# Patient Record
Sex: Female | Born: 1935 | Race: Black or African American | Hispanic: No | Smoking: Former smoker
Health system: Southern US, Community
[De-identification: ages and names within clinical notes are randomized; demographics above are authoritative.]

## PROBLEM LIST (undated history)

## (undated) DIAGNOSIS — G510 Bell's palsy: Secondary | ICD-10-CM

## (undated) DIAGNOSIS — I10 Essential (primary) hypertension: Secondary | ICD-10-CM

## (undated) DIAGNOSIS — Z96649 Presence of unspecified artificial hip joint: Secondary | ICD-10-CM

## (undated) DIAGNOSIS — L89153 Pressure ulcer of sacral region, stage 3: Secondary | ICD-10-CM

## (undated) DIAGNOSIS — W19XXXA Unspecified fall, initial encounter: Secondary | ICD-10-CM

## (undated) DIAGNOSIS — F419 Anxiety disorder, unspecified: Secondary | ICD-10-CM

## (undated) DIAGNOSIS — F32A Depression, unspecified: Secondary | ICD-10-CM

## (undated) DIAGNOSIS — I447 Left bundle-branch block, unspecified: Secondary | ICD-10-CM

## (undated) DIAGNOSIS — R296 Repeated falls: Secondary | ICD-10-CM

## (undated) DIAGNOSIS — D649 Anemia, unspecified: Secondary | ICD-10-CM

## (undated) DIAGNOSIS — K219 Gastro-esophageal reflux disease without esophagitis: Secondary | ICD-10-CM

## (undated) DIAGNOSIS — F329 Major depressive disorder, single episode, unspecified: Secondary | ICD-10-CM

## (undated) DIAGNOSIS — M978XXA Periprosthetic fracture around other internal prosthetic joint, initial encounter: Secondary | ICD-10-CM

## (undated) DIAGNOSIS — E119 Type 2 diabetes mellitus without complications: Secondary | ICD-10-CM

## (undated) DIAGNOSIS — M1990 Unspecified osteoarthritis, unspecified site: Secondary | ICD-10-CM

## (undated) DIAGNOSIS — I700 Atherosclerosis of aorta: Secondary | ICD-10-CM

## (undated) DIAGNOSIS — R011 Cardiac murmur, unspecified: Secondary | ICD-10-CM

## (undated) HISTORY — PX: APPENDECTOMY: SHX54

## (undated) HISTORY — PX: TUBAL LIGATION: SHX77

## (undated) HISTORY — PX: FRACTURE SURGERY: SHX138

---

## 2012-05-19 ENCOUNTER — Inpatient Hospital Stay (HOSPITAL_COMMUNITY)
Admission: AD | Admit: 2012-05-19 | Discharge: 2012-05-25 | DRG: 470 | Disposition: A | Discharge status: Home Health | Payer: Medicare Other | Source: Other Acute Inpatient Hospital | Attending: Internal Medicine | Admitting: Internal Medicine

## 2012-05-19 ENCOUNTER — Encounter (HOSPITAL_COMMUNITY): Payer: Self-pay | Admitting: Internal Medicine

## 2012-05-19 DIAGNOSIS — E119 Type 2 diabetes mellitus without complications: Secondary | ICD-10-CM | POA: Diagnosis present

## 2012-05-19 DIAGNOSIS — S72009A Fracture of unspecified part of neck of unspecified femur, initial encounter for closed fracture: Principal | ICD-10-CM | POA: Diagnosis present

## 2012-05-19 DIAGNOSIS — F411 Generalized anxiety disorder: Secondary | ICD-10-CM | POA: Diagnosis present

## 2012-05-19 DIAGNOSIS — D649 Anemia, unspecified: Secondary | ICD-10-CM

## 2012-05-19 DIAGNOSIS — I447 Left bundle-branch block, unspecified: Secondary | ICD-10-CM

## 2012-05-19 DIAGNOSIS — G510 Bell's palsy: Secondary | ICD-10-CM | POA: Diagnosis present

## 2012-05-19 DIAGNOSIS — E871 Hypo-osmolality and hyponatremia: Secondary | ICD-10-CM | POA: Diagnosis present

## 2012-05-19 DIAGNOSIS — W108XXA Fall (on) (from) other stairs and steps, initial encounter: Secondary | ICD-10-CM | POA: Diagnosis present

## 2012-05-19 DIAGNOSIS — I10 Essential (primary) hypertension: Secondary | ICD-10-CM

## 2012-05-19 DIAGNOSIS — F3289 Other specified depressive episodes: Secondary | ICD-10-CM | POA: Diagnosis present

## 2012-05-19 DIAGNOSIS — D62 Acute posthemorrhagic anemia: Secondary | ICD-10-CM

## 2012-05-19 DIAGNOSIS — E118 Type 2 diabetes mellitus with unspecified complications: Secondary | ICD-10-CM

## 2012-05-19 DIAGNOSIS — F329 Major depressive disorder, single episode, unspecified: Secondary | ICD-10-CM | POA: Diagnosis present

## 2012-05-19 DIAGNOSIS — I454 Nonspecific intraventricular block: Secondary | ICD-10-CM | POA: Diagnosis present

## 2012-05-19 HISTORY — DX: Cardiac murmur, unspecified: R01.1

## 2012-05-19 HISTORY — DX: Depression, unspecified: F32.A

## 2012-05-19 HISTORY — DX: Bell's palsy: G51.0

## 2012-05-19 HISTORY — DX: Essential (primary) hypertension: I10

## 2012-05-19 HISTORY — DX: Unspecified osteoarthritis, unspecified site: M19.90

## 2012-05-19 HISTORY — DX: Anemia, unspecified: D64.9

## 2012-05-19 HISTORY — DX: Major depressive disorder, single episode, unspecified: F32.9

## 2012-05-19 LAB — CBC
MCH: 28.6 pg (ref 26.0–34.0)
Platelets: 199 10*3/uL (ref 150–400)
RBC: 3.7 MIL/uL — ABNORMAL LOW (ref 3.87–5.11)
RDW: 13.3 % (ref 11.5–15.5)
WBC: 8.7 10*3/uL (ref 4.0–10.5)

## 2012-05-19 LAB — COMPREHENSIVE METABOLIC PANEL
AST: 31 U/L (ref 0–37)
Albumin: 3.8 g/dL (ref 3.5–5.2)
Alkaline Phosphatase: 57 U/L (ref 39–117)
Chloride: 93 mEq/L — ABNORMAL LOW (ref 96–112)
Potassium: 3.9 mEq/L (ref 3.5–5.1)
Total Bilirubin: 0.8 mg/dL (ref 0.3–1.2)
Total Protein: 8.5 g/dL — ABNORMAL HIGH (ref 6.0–8.3)

## 2012-05-19 LAB — HEMOGLOBIN A1C: Hgb A1c MFr Bld: 5.9 % — ABNORMAL HIGH (ref <5.7)

## 2012-05-19 LAB — GLUCOSE, CAPILLARY
Glucose-Capillary: 97 mg/dL (ref 70–99)
Glucose-Capillary: 121 mg/dL — ABNORMAL HIGH (ref 70–99)

## 2012-05-19 MED ORDER — ONDANSETRON HCL 4 MG/2ML IJ SOLN: 4.0000 mg | Freq: Four times a day (QID) | INTRAMUSCULAR | Status: DC | PRN

## 2012-05-19 MED ORDER — SODIUM CHLORIDE 0.9 % IJ SOLN
3.0000 mL | Freq: Two times a day (BID) | INTRAMUSCULAR | Status: DC
  Administered 2012-05-19 – 2012-05-24 (×8): 3 mL via INTRAVENOUS

## 2012-05-19 MED ORDER — ENOXAPARIN SODIUM 40 MG/0.4ML ~~LOC~~ SOLN
40.0000 mg | SUBCUTANEOUS | Status: DC
  Filled 2012-05-19 (×2): qty 0.4

## 2012-05-19 MED ORDER — INSULIN ASPART 100 UNIT/ML ~~LOC~~ SOLN
0.0000 [IU] | Freq: Three times a day (TID) | SUBCUTANEOUS | Status: DC
  Administered 2012-05-20: 1 [IU] via SUBCUTANEOUS
  Administered 2012-05-21 – 2012-05-22 (×3): 2 [IU] via SUBCUTANEOUS
  Administered 2012-05-23 – 2012-05-24 (×2): 1 [IU] via SUBCUTANEOUS
  Administered 2012-05-24: 2 [IU] via SUBCUTANEOUS
  Administered 2012-05-24: 1 [IU] via SUBCUTANEOUS

## 2012-05-19 MED ORDER — CEFAZOLIN SODIUM-DEXTROSE 2-3 GM-% IV SOLR
2.0000 g | Freq: Once | INTRAVENOUS | Status: AC
  Administered 2012-05-20: 2 g via INTRAVENOUS
  Filled 2012-05-19: qty 50

## 2012-05-19 MED ORDER — HYDROMORPHONE HCL PF 1 MG/ML IJ SOLN
0.5000 mg | INTRAMUSCULAR | Status: DC | PRN
  Administered 2012-05-20: 0.5 mg via INTRAVENOUS
  Filled 2012-05-19: qty 1

## 2012-05-19 MED ORDER — LEVALBUTEROL HCL 0.63 MG/3ML IN NEBU
0.6300 mg | INHALATION_SOLUTION | Freq: Four times a day (QID) | RESPIRATORY_TRACT | Status: DC | PRN
  Filled 2012-05-19: qty 3

## 2012-05-19 MED ORDER — HYDROCODONE-ACETAMINOPHEN 5-325 MG PO TABS
1.0000 | ORAL_TABLET | ORAL | Status: DC | PRN
  Administered 2012-05-19 – 2012-05-20 (×3): 2 via ORAL
  Administered 2012-05-21: 1 via ORAL
  Filled 2012-05-19 (×4): qty 2

## 2012-05-19 MED ORDER — ONDANSETRON HCL 4 MG PO TABS: 4.0000 mg | ORAL_TABLET | Freq: Four times a day (QID) | ORAL | Status: DC | PRN

## 2012-05-19 MED ORDER — SODIUM CHLORIDE 0.9 % IV SOLN
INTRAVENOUS | Status: DC
  Administered 2012-05-19: 15:00:00 via INTRAVENOUS

## 2012-05-19 NOTE — Consult Note (Signed)
Reason for Consult:left femoral neck fracture Referring Physician: Jeanella Anton    MD  [name] is an 79 y.o. female.  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   Past Medical History  Diagnosis Date  . Heart murmur   . Hypertension   . Depression   . Diabetes mellitus without complication     type 2  . Bell's palsy   . Arthritis   . Anemia     Past Surgical History  Procedure Date  . Appendectomy   . Tubal ligation     History reviewed. No pertinent family history.  Social History:  reports that she quit smoking about 37 years ago. She has never used smokeless tobacco. She reports that she does not drink alcohol or use illicit drugs.  Allergies: No Known Allergies  Medications: I have reviewed the patient's current medications.  Results for orders placed during the hospital encounter of 05/19/12 (from the past 48 hour(s))  GLUCOSE, CAPILLARY     Status: Normal   Collection Time   05/19/12  2:45 PM      Component Value Range Comment   Glucose-Capillary 97  70 - 99 mg/dL   CBC     Status: Abnormal   Collection Time   05/19/12  3:12 PM      Component Value Range Comment   WBC 8.7  4.0 - 10.5 K/uL    RBC 3.70 (*) 3.87 - 5.11 MIL/uL    Hemoglobin 10.6 (*) 12.0 - 15.0 g/dL    HCT 47.8 (*) 29.5 - 46.0 %    MCV 82.4  78.0 - 100.0 fL    MCH 28.6  26.0 - 34.0 pg    MCHC 34.8  30.0 - 36.0 g/dL    RDW 62.1  30.8 - 65.7 %    Platelets 199  150 - 400 K/uL   TROPONIN I     Status: Normal   Collection Time   05/19/12  3:12 PM      Component Value Range Comment   Troponin I <0.30  <0.30 ng/mL   COMPREHENSIVE METABOLIC PANEL     Status: Abnormal   Collection Time   05/19/12  3:12 PM      Component Value Range Comment   Sodium 131 (*) 135 - 145 mEq/L    Potassium 3.9  3.5 - 5.1 mEq/L    Chloride 93 (*) 96 - 112 mEq/L    CO2 25  19 -  32 mEq/L    Glucose, Bld 110 (*) 70 - 99 mg/dL    BUN 9  6 - 23 mg/dL    Creatinine, Ser 8.46  0.50 - 1.10 mg/dL    Calcium 9.5  8.4 - 96.2 mg/dL    Total Protein 8.5 (*) 6.0 - 8.3 g/dL    Albumin 3.8  3.5 - 5.2 g/dL    AST 31  0 - 37 U/L    ALT 18  0 - 35 U/L    Alkaline Phosphatase 57  39 - 117 U/L    Total Bilirubin 0.8  0.3 - 1.2 mg/dL    GFR calc non Af Amer >90  >90 mL/min    GFR calc Af Amer >90  >90 mL/min   GLUCOSE, CAPILLARY     Status: Abnormal   Collection Time   05/19/12  4:47 PM  Component Value Range Comment   Glucose-Capillary 174 (*) 70 - 99 mg/dL     No results found.  Review of Systems  Constitutional: Negative for fever, chills and weight loss.  Eyes: Negative for blurred vision.  Respiratory: Negative.   Cardiovascular: Negative for chest pain.       Heart murmur just noted on admission.   Positive for HTN  Gastrointestinal: Negative.   Genitourinary: Negative.   Musculoskeletal: Positive for joint pain.  Skin: Negative for rash.  Neurological: Positive for headaches.  Endo/Heme/Allergies:       Diabetes  Psychiatric/Behavioral: Positive for depression.   Blood pressure 160/71, pulse 79, temperature 99.4 F (37.4 C), temperature source Oral, resp. rate 18, SpO2 94.00%. Physical Exam  Constitutional: She appears well-developed and well-nourished.  HENT:  Head: Normocephalic.  Eyes: Pupils are equal, round, and reactive to light.  Neck: Normal range of motion.  Cardiovascular: Normal rate and normal heart sounds.   Respiratory: Effort normal and breath sounds normal.  GI: Soft.  Musculoskeletal:       Left hip short and external rotated. Pulses intact.   Skin: Skin is warm and dry.  Psychiatric: She has a normal mood and affect. Her behavior is normal. Judgment and thought content normal.    Assessment/Plan:   Left femoral neck fracture for hemiarthroplasty vs. THA .  Procedure discussed , risks discussed, she understands and agrees to  proceed. Discussed with daughters who are at the bedside.   Jed Kutch C 05/19/2012, 5:42 PM

## 2012-05-19 NOTE — H&P (Addendum)
[facility] History and Physical  Lillee Mooneyhan ZOX:[id number] DOB: 20-May-1933 DOA: 05/19/2012  Referring physician: [facility] PCP: Vennie Homans, MD   Chief Complaint: Hip fracture  HPI:  79 year old female who presents to the emergency room at [facility] after a fall. The patient states that she fell after she tripped over couple of steps while saying goodbye to her son. The patient was not paying attention while she was walking lost her balance and fell. She denied any head injury loss of consciousness neck injury back injury chest pain shortness of breath abdominal pain nausea vomiting. She denies any cardiac history and denies any history of coronary artery disease although she is a diabetic. Her EKG shows left bundle-branch block but she is unaware often denies ever having an MI . She was transferred to [facility] for surgery by Dr. Ophelia Charter for her hip fracture      Review of Systems: negative for the following  Constitutional: Denies fever, chills, diaphoresis, appetite change and fatigue.  HEENT: Denies photophobia, eye pain, redness, hearing loss, ear pain, congestion, sore throat, rhinorrhea, sneezing, mouth sores, trouble swallowing, neck pain, neck stiffness and tinnitus.  Respiratory: Denies SOB, DOE, cough, chest tightness, and wheezing.  Cardiovascular: Denies chest pain, palpitations and leg swelling.  Gastrointestinal: Denies nausea, vomiting, abdominal pain, diarrhea, constipation, blood in stool and abdominal distention.  Genitourinary: Denies dysuria, urgency, frequency, hematuria, flank pain and difficulty urinating.  Musculoskeletal: Denies myalgias, back pain, joint swelling, arthralgias and gait problem.  Skin: Denies pallor, rash and wound.  Neurological: Denies dizziness, seizures, syncope, weakness, light-headedness, numbness and headaches.  Hematological: Denies adenopathy. Easy bruising, personal or family bleeding history    Psychiatric/Behavioral: Denies suicidal ideation, mood changes, confusion, nervousness, sleep disturbance and agitation       Past medical history Hypertension Type 2 diabetes Anxiety Depression    Past surgical history Tubal ligation Appendectomy Hysterectomy   Allergies no known allergies  Home medications Xanax meloxicam lisinopril potassium cephalexin Glucophage Norvasc metoprolol Celexa     No past surgical history on file.    Social History:  does not have a smoking history on file. She does not have any smokeless tobacco history on file. Her alcohol and drug histories not on file.    Allergies not on file  Family history negative for diabetes   Prior to Admission medications   Not on File     Physical Exam: Filed Vitals:   05/19/12 1418  BP: 160/71  Pulse: 79  Temp: 99.4 F (37.4 C)  TempSrc: Oral  Resp: 18  SpO2: 94%     Constitutional: Vital signs reviewed. Patient is a well-developed and well-nourished in no acute distress and cooperative with exam. Alert and oriented x3.  Head: Normocephalic and atraumatic  Ear: TM normal bilaterally  Mouth: no erythema or exudates, MMM  Eyes: PERRL, EOMI, conjunctivae normal, No scleral icterus.  Neck: Supple, Trachea midline normal ROM, No JVD, mass, thyromegaly, or carotid bruit present.  Cardiovascular: RRR, S1 normal, S2 normal, no MRG, pulses symmetric and intact bilaterally  Pulmonary/Chest: CTAB, no wheezes, rales, or rhonchi  Abdominal: Soft. Non-tender, non-distended, bowel sounds are normal, no masses, organomegaly, or guarding present.  GU: no CVA tenderness Musculoskeletal: No joint deformities, erythema, or stiffness, ROM full and no nontender Ext: no edema and no cyanosis, pulses palpable bilaterally (DP and PT)  Hematology: no cervical, inginal, or axillary adenopathy.  Neurological: A&O x3, Strenght is normal and symmetric bilaterally, cranial nerve II-XII are grossly intact,  no focal  motor deficit, sensory intact to light touch bilaterally.  Skin: Warm, dry and intact. No rash, cyanosis, or clubbing.  Psychiatric: Normal mood and affect. speech and behavior is normal. Judgment and thought content normal. Cognition and memory are normal.       Labs on Admission:    Basic Metabolic Panel: No results found for this basename: NA:5,K:5,CL:5,CO2:5,GLUCOSE:5,BUN:5,CREATININE:5,CALCIUM:5,MG:5,PHOS:5 in the last 168 hours Liver Function Tests: No results found for this basename: AST:5,ALT:5,ALKPHOS:5,BILITOT:5,PROT:5,ALBUMIN:5 in the last 168 hours No results found for this basename: LIPASE:5,AMYLASE:5 in the last 168 hours No results found for this basename: AMMONIA:5 in the last 168 hours CBC: No results found for this basename: WBC:5,NEUTROABS:5,HGB:5,HCT:5,MCV:5,PLT:5 in the last 168 hours Cardiac Enzymes: No results found for this basename: CKTOTAL:5,CKMB:5,CKMBINDEX:5,TROPONINI:5 in the last 168 hours  BNP (last 3 results) No results found for this basename: PROBNP:3 in the last 8760 hours    CBG:  Lab 05/19/12 1445  GLUCAP 97    Radiological Exams on Admission: No results found.  EKG: Independently reviewed. EKG shows left bundle branch block Assessment/Plan Principal Problem:  *Hip fracture   Left Fracture Dr. Ophelia Charter aWARE, anticipate surgery tomorrow, did not any history of any cardiopulmonary symptoms. No chest pain or shortness of breath or history of arrhythmias, but the patient has left bundle branch block, obtain a 2-D echo and a set of cardiac enzymes and possibly repeat EKG after surgery. She is a moderate risk given her age and her other comorbidities, for this intermediate risk surgery, the patient was made aware of this and she is willing to proceed with the surgery  Type 2 diabetes start the patient on sliding scale insulin  HYPERTENSION continue outpatient medications    hyponatremia she had a sodium of 126 unclear etiology, will obtain  urine sodium urine creatinine and place her on fluid restriction, we'll repeat a chest x-ray as a result from [facility] are unavailable  Anemia with a baseline hemoglobin of 10 .9 repeat hemoglobin, baseline unknown   Code Status:   full Family Communication: bedside Disposition Plan: admit   Time spent: 70 mins   [facility] Pager [phone]  If 7PM-7AM, please contact night-coverage [email or web address] Password [facility] 05/19/2012, 2:48 PM

## 2012-05-19 NOTE — Progress Notes (Signed)
Patient ID: [name], female   DOB: 08-Nov-1933, 79 y.o.   MRN: [id number] Surgery Friday at about 10 AM or 10:30AM.    NPO and pre-op Ancef ordered.  All ?'s answered ,  4 children at bedside. Discussed anterior vs posterior approach planned.

## 2012-05-20 ENCOUNTER — Inpatient Hospital Stay (HOSPITAL_COMMUNITY): Payer: Medicare Other | Admitting: Anesthesiology

## 2012-05-20 ENCOUNTER — Inpatient Hospital Stay: Admit: 2012-05-20 | Payer: Medicare Other | Admitting: Orthopaedic Surgery

## 2012-05-20 ENCOUNTER — Encounter (HOSPITAL_COMMUNITY)
Admission: AD | Disposition: A | Discharge status: Home Health | Payer: Self-pay | Source: Other Acute Inpatient Hospital | Attending: Family Medicine

## 2012-05-20 ENCOUNTER — Encounter (HOSPITAL_COMMUNITY): Payer: Self-pay | Admitting: Anesthesiology

## 2012-05-20 ENCOUNTER — Inpatient Hospital Stay (HOSPITAL_COMMUNITY): Payer: Medicare Other

## 2012-05-20 DIAGNOSIS — I369 Nonrheumatic tricuspid valve disorder, unspecified: Secondary | ICD-10-CM

## 2012-05-20 DIAGNOSIS — S72009A Fracture of unspecified part of neck of unspecified femur, initial encounter for closed fracture: Principal | ICD-10-CM

## 2012-05-20 DIAGNOSIS — I447 Left bundle-branch block, unspecified: Secondary | ICD-10-CM

## 2012-05-20 DIAGNOSIS — E119 Type 2 diabetes mellitus without complications: Secondary | ICD-10-CM

## 2012-05-20 DIAGNOSIS — E871 Hypo-osmolality and hyponatremia: Secondary | ICD-10-CM

## 2012-05-20 DIAGNOSIS — D638 Anemia in other chronic diseases classified elsewhere: Secondary | ICD-10-CM | POA: Insufficient documentation

## 2012-05-20 DIAGNOSIS — D649 Anemia, unspecified: Secondary | ICD-10-CM

## 2012-05-20 HISTORY — PX: HIP ARTHROPLASTY: SHX981

## 2012-05-20 LAB — URINALYSIS, ROUTINE W REFLEX MICROSCOPIC
Nitrite: NEGATIVE
Specific Gravity, Urine: 1.011 (ref 1.005–1.030)
pH: 5.5 (ref 5.0–8.0)

## 2012-05-20 LAB — CREATININE, URINE, RANDOM: Creatinine, Urine: 69.52 mg/dL

## 2012-05-20 LAB — COMPREHENSIVE METABOLIC PANEL
AST: 27 U/L (ref 0–37)
Alkaline Phosphatase: 48 U/L (ref 39–117)
CO2: 26 mEq/L (ref 19–32)
Chloride: 93 mEq/L — ABNORMAL LOW (ref 96–112)
Creatinine, Ser: 0.6 mg/dL (ref 0.50–1.10)
GFR calc non Af Amer: 86 mL/min — ABNORMAL LOW (ref >90)
Potassium: 3.7 mEq/L (ref 3.5–5.1)
Total Bilirubin: 0.6 mg/dL (ref 0.3–1.2)

## 2012-05-20 LAB — CBC
HCT: 29.2 % — ABNORMAL LOW (ref 36.0–46.0)
Hemoglobin: 9.1 g/dL — ABNORMAL LOW (ref 12.0–15.0)
Hemoglobin: 9.9 g/dL — ABNORMAL LOW (ref 12.0–15.0)
MCH: 28.8 pg (ref 26.0–34.0)
MCV: 84.9 fL (ref 78.0–100.0)
RBC: 3.2 MIL/uL — ABNORMAL LOW (ref 3.87–5.11)
RBC: 3.44 MIL/uL — ABNORMAL LOW (ref 3.87–5.11)
WBC: 5.8 10*3/uL (ref 4.0–10.5)

## 2012-05-20 LAB — ABO/RH: ABO/RH(D): B NEG

## 2012-05-20 LAB — GLUCOSE, CAPILLARY
Glucose-Capillary: 93 mg/dL (ref 70–99)
Glucose-Capillary: 136 mg/dL — ABNORMAL HIGH (ref 70–99)

## 2012-05-20 LAB — SODIUM, URINE, RANDOM: Sodium, Ur: 22 mEq/L

## 2012-05-20 LAB — URINE MICROSCOPIC-ADD ON

## 2012-05-20 LAB — PROTIME-INR
INR: 1.04 (ref 0.00–1.49)
Prothrombin Time: 13.5 seconds (ref 11.6–15.2)

## 2012-05-20 LAB — TROPONIN I: Troponin I: <0.3 ng/mL (ref <0.30)

## 2012-05-20 LAB — CREATININE, SERUM: Creatinine, Ser: 0.57 mg/dL (ref 0.50–1.10)

## 2012-05-20 SURGERY — HEMIARTHROPLASTY, HIP, DIRECT ANTERIOR APPROACH, FOR FRACTURE
Anesthesia: General | Site: Hip | Laterality: Left | Wound class: Clean

## 2012-05-20 MED ORDER — ALPRAZOLAM 0.5 MG PO TABS
0.5000 mg | ORAL_TABLET | Freq: Every evening | ORAL | Status: DC | PRN
  Administered 2012-05-20 – 2012-05-24 (×5): 0.5 mg via ORAL
  Filled 2012-05-20 (×5): qty 1

## 2012-05-20 MED ORDER — WARFARIN - PHARMACIST DOSING INPATIENT
Freq: Every day | Status: DC
  Administered 2012-05-21 – 2012-05-22 (×2)

## 2012-05-20 MED ORDER — MENTHOL 3 MG MT LOZG
1.0000 | LOZENGE | OROMUCOSAL | Status: DC | PRN
  Filled 2012-05-20 (×2): qty 9

## 2012-05-20 MED ORDER — DOCUSATE SODIUM 100 MG PO CAPS
100.0000 mg | ORAL_CAPSULE | Freq: Two times a day (BID) | ORAL | Status: DC
  Administered 2012-05-20 – 2012-05-25 (×10): 100 mg via ORAL
  Filled 2012-05-20 (×12): qty 1

## 2012-05-20 MED ORDER — CITALOPRAM HYDROBROMIDE 10 MG PO TABS
10.0000 mg | ORAL_TABLET | Freq: Every day | ORAL | Status: DC
  Administered 2012-05-20 – 2012-05-25 (×6): 10 mg via ORAL
  Filled 2012-05-20 (×6): qty 1

## 2012-05-20 MED ORDER — METOCLOPRAMIDE HCL 5 MG PO TABS
5.0000 mg | ORAL_TABLET | Freq: Three times a day (TID) | ORAL | Status: DC | PRN
  Filled 2012-05-20: qty 2

## 2012-05-20 MED ORDER — FLEET ENEMA 7-19 GM/118ML RE ENEM
1.0000 | ENEMA | Freq: Once | RECTAL | Status: AC | PRN
  Filled 2012-05-20: qty 1

## 2012-05-20 MED ORDER — LISINOPRIL 20 MG PO TABS
20.0000 mg | ORAL_TABLET | Freq: Every day | ORAL | Status: DC
  Administered 2012-05-20 – 2012-05-25 (×6): 20 mg via ORAL
  Filled 2012-05-20 (×6): qty 1

## 2012-05-20 MED ORDER — NEOSTIGMINE METHYLSULFATE 1 MG/ML IJ SOLN
INTRAMUSCULAR | Status: DC | PRN
  Administered 2012-05-20: 3 mg via INTRAVENOUS

## 2012-05-20 MED ORDER — CHLORHEXIDINE GLUCONATE 4 % EX LIQD
CUTANEOUS | Status: AC
  Filled 2012-05-20: qty 15

## 2012-05-20 MED ORDER — CEFAZOLIN SODIUM-DEXTROSE 2-3 GM-% IV SOLR
2.0000 g | Freq: Four times a day (QID) | INTRAVENOUS | Status: AC
  Administered 2012-05-20 – 2012-05-21 (×2): 2 g via INTRAVENOUS
  Filled 2012-05-20 (×2): qty 50

## 2012-05-20 MED ORDER — FENTANYL CITRATE 0.05 MG/ML IJ SOLN
INTRAMUSCULAR | Status: DC | PRN
  Administered 2012-05-20 (×4): 50 ug via INTRAVENOUS

## 2012-05-20 MED ORDER — POTASSIUM CHLORIDE CRYS ER 20 MEQ PO TBCR
20.0000 meq | EXTENDED_RELEASE_TABLET | Freq: Every day | ORAL | Status: DC
  Administered 2012-05-20 – 2012-05-25 (×6): 20 meq via ORAL
  Filled 2012-05-20 (×7): qty 1

## 2012-05-20 MED ORDER — PATIENT'S GUIDE TO USING COUMADIN BOOK
Freq: Once | Status: AC
  Administered 2012-05-20: 18:00:00
  Filled 2012-05-20: qty 1

## 2012-05-20 MED ORDER — LOXAPINE SUCCINATE 10 MG PO CAPS
20.0000 mg | ORAL_CAPSULE | Freq: Every day | ORAL | Status: DC
  Filled 2012-05-20: qty 2

## 2012-05-20 MED ORDER — MORPHINE SULFATE 2 MG/ML IJ SOLN: 0.5000 mg | INTRAMUSCULAR | Status: DC | PRN

## 2012-05-20 MED ORDER — AMLODIPINE BESYLATE 5 MG PO TABS
5.0000 mg | ORAL_TABLET | Freq: Every day | ORAL | Status: DC
  Administered 2012-05-20 – 2012-05-25 (×6): 5 mg via ORAL
  Filled 2012-05-20 (×6): qty 1

## 2012-05-20 MED ORDER — HYDROMORPHONE HCL PF 1 MG/ML IJ SOLN
0.2500 mg | INTRAMUSCULAR | Status: DC | PRN
  Administered 2012-05-20 (×2): 0.5 mg via INTRAVENOUS

## 2012-05-20 MED ORDER — METHOCARBAMOL 500 MG PO TABS
500.0000 mg | ORAL_TABLET | Freq: Four times a day (QID) | ORAL | Status: DC | PRN
  Administered 2012-05-22 – 2012-05-25 (×4): 500 mg via ORAL
  Filled 2012-05-20 (×6): qty 1

## 2012-05-20 MED ORDER — SENNOSIDES-DOCUSATE SODIUM 8.6-50 MG PO TABS
1.0000 | ORAL_TABLET | Freq: Every evening | ORAL | Status: DC | PRN
  Administered 2012-05-22 – 2012-05-23 (×2): 1 via ORAL
  Filled 2012-05-20 (×2): qty 1

## 2012-05-20 MED ORDER — WARFARIN SODIUM 5 MG PO TABS
5.0000 mg | ORAL_TABLET | Freq: Once | ORAL | Status: AC
  Administered 2012-05-20: 5 mg via ORAL
  Filled 2012-05-20: qty 1

## 2012-05-20 MED ORDER — ENOXAPARIN SODIUM 40 MG/0.4ML ~~LOC~~ SOLN
40.0000 mg | SUBCUTANEOUS | Status: DC
  Administered 2012-05-21 – 2012-05-25 (×5): 40 mg via SUBCUTANEOUS
  Filled 2012-05-20 (×7): qty 0.4

## 2012-05-20 MED ORDER — METHOCARBAMOL 100 MG/ML IJ SOLN
500.0000 mg | INTRAMUSCULAR | Status: AC
  Administered 2012-05-20: 500 mg via INTRAVENOUS
  Filled 2012-05-20: qty 5

## 2012-05-20 MED ORDER — CEFAZOLIN SODIUM 1-5 GM-% IV SOLN
1.0000 g | INTRAVENOUS | Status: AC
  Administered 2012-05-20: 1 g via INTRAVENOUS
  Filled 2012-05-20: qty 50

## 2012-05-20 MED ORDER — PHENOL 1.4 % MT LIQD
1.0000 | OROMUCOSAL | Status: DC | PRN
  Filled 2012-05-20: qty 177

## 2012-05-20 MED ORDER — METOCLOPRAMIDE HCL 5 MG/ML IJ SOLN
5.0000 mg | Freq: Three times a day (TID) | INTRAMUSCULAR | Status: DC | PRN
  Filled 2012-05-20: qty 2

## 2012-05-20 MED ORDER — LOXAPINE SUCCINATE 10 MG PO CAPS
20.0000 mg | ORAL_CAPSULE | Freq: Every day | ORAL | Status: DC
  Administered 2012-05-20 – 2012-05-24 (×5): 20 mg via ORAL
  Filled 2012-05-20 (×8): qty 2

## 2012-05-20 MED ORDER — BISACODYL 10 MG RE SUPP: 10.0000 mg | Freq: Every day | RECTAL | Status: DC | PRN

## 2012-05-20 MED ORDER — SODIUM CHLORIDE 0.9 % IR SOLN
Status: DC | PRN
  Administered 2012-05-20: 1000 mL

## 2012-05-20 MED ORDER — LACTATED RINGERS IV SOLN
INTRAVENOUS | Status: DC
  Administered 2012-05-20: 09:00:00 via INTRAVENOUS

## 2012-05-20 MED ORDER — METHOCARBAMOL 100 MG/ML IJ SOLN
500.0000 mg | Freq: Four times a day (QID) | INTRAVENOUS | Status: DC | PRN
  Filled 2012-05-20: qty 5

## 2012-05-20 MED ORDER — GLYCOPYRROLATE 0.2 MG/ML IJ SOLN
INTRAMUSCULAR | Status: DC | PRN
  Administered 2012-05-20: 0.4 mg via INTRAVENOUS

## 2012-05-20 MED ORDER — ACETAMINOPHEN 10 MG/ML IV SOLN
1000.0000 mg | Freq: Four times a day (QID) | INTRAVENOUS | Status: DC
  Administered 2012-05-20 – 2012-05-21 (×3): 1000 mg via INTRAVENOUS
  Filled 2012-05-20 (×4): qty 100

## 2012-05-20 MED ORDER — HYDROMORPHONE HCL PF 1 MG/ML IJ SOLN
INTRAMUSCULAR | Status: AC
  Filled 2012-05-20: qty 1

## 2012-05-20 MED ORDER — LACTATED RINGERS IV SOLN
INTRAVENOUS | Status: DC | PRN
  Administered 2012-05-20: 11:00:00 via INTRAVENOUS

## 2012-05-20 MED ORDER — ROCURONIUM BROMIDE 100 MG/10ML IV SOLN
INTRAVENOUS | Status: DC | PRN
  Administered 2012-05-20: 40 mg via INTRAVENOUS

## 2012-05-20 MED ORDER — POTASSIUM CHLORIDE IN NACL 20-0.9 MEQ/L-% IV SOLN
INTRAVENOUS | Status: DC
  Administered 2012-05-20: 17:00:00 via INTRAVENOUS
  Filled 2012-05-20 (×3): qty 1000

## 2012-05-20 MED ORDER — LIDOCAINE HCL (CARDIAC) 20 MG/ML IV SOLN
INTRAVENOUS | Status: DC | PRN
  Administered 2012-05-20: 50 mg via INTRAVENOUS

## 2012-05-20 MED ORDER — PROPOFOL 10 MG/ML IV BOLUS
INTRAVENOUS | Status: DC | PRN
  Administered 2012-05-20: 90 mg via INTRAVENOUS

## 2012-05-20 MED ORDER — BUPIVACAINE HCL (PF) 0.5 % IJ SOLN
INTRAMUSCULAR | Status: AC
  Filled 2012-05-20: qty 30

## 2012-05-20 MED ORDER — ONDANSETRON HCL 4 MG/2ML IJ SOLN
INTRAMUSCULAR | Status: DC | PRN
  Administered 2012-05-20: 4 mg via INTRAVENOUS

## 2012-05-20 SURGICAL SUPPLY — 60 items
BENZOIN TINCTURE PRP APPL 2/3 (GAUZE/BANDAGES/DRESSINGS) ×2 IMPLANT
BLADE SAW SAG 73X25 THK (BLADE) ×1
BLADE SAW SGTL 73X25 THK (BLADE) ×1 IMPLANT
BLADE SURG ROTATE 9660 (MISCELLANEOUS) IMPLANT
BRUSH FEMORAL CANAL (MISCELLANEOUS) IMPLANT
CLOTH BEACON ORANGE TIMEOUT ST (SAFETY) ×2 IMPLANT
COVER BACK TABLE 24X17X13 BIG (DRAPES) IMPLANT
DRAPE INCISE IOBAN 66X45 STRL (DRAPES) IMPLANT
DRAPE ORTHO SPLIT 77X108 STRL (DRAPES) ×2
DRAPE SURG ORHT 6 SPLT 77X108 (DRAPES) ×2 IMPLANT
DRAPE U-SHAPE 47X51 STRL (DRAPES) ×2 IMPLANT
DRSG MEPILEX BORDER 4X12 (GAUZE/BANDAGES/DRESSINGS) ×2 IMPLANT
DRSG MEPILEX BORDER 4X8 (GAUZE/BANDAGES/DRESSINGS) ×2 IMPLANT
DRSG PAD ABDOMINAL 8X10 ST (GAUZE/BANDAGES/DRESSINGS) ×4 IMPLANT
DURAPREP 26ML APPLICATOR (WOUND CARE) ×2 IMPLANT
ELECT CAUTERY BLADE 6.4 (BLADE) ×2 IMPLANT
ELECT REM PT RETURN 9FT ADLT (ELECTROSURGICAL) ×2
ELECTRODE REM PT RTRN 9FT ADLT (ELECTROSURGICAL) ×1 IMPLANT
EVACUATOR 1/8 PVC DRAIN (DRAIN) IMPLANT
FACESHIELD LNG OPTICON STERILE (SAFETY) ×4 IMPLANT
GAUZE XEROFORM 5X9 LF (GAUZE/BANDAGES/DRESSINGS) ×2 IMPLANT
GLOVE BIOGEL PI IND STRL 7.5 (GLOVE) ×1 IMPLANT
GLOVE BIOGEL PI IND STRL 8 (GLOVE) ×1 IMPLANT
GLOVE BIOGEL PI INDICATOR 7.5 (GLOVE) ×1
GLOVE BIOGEL PI INDICATOR 8 (GLOVE) ×1
GLOVE ECLIPSE 7.0 STRL STRAW (GLOVE) ×2 IMPLANT
GLOVE ORTHO TXT STRL SZ7.5 (GLOVE) ×2 IMPLANT
GOWN PREVENTION PLUS LG XLONG (DISPOSABLE) IMPLANT
GOWN PREVENTION PLUS XLARGE (GOWN DISPOSABLE) ×2 IMPLANT
GOWN STRL NON-REIN LRG LVL3 (GOWN DISPOSABLE) ×2 IMPLANT
HANDPIECE INTERPULSE COAX TIP (DISPOSABLE)
IMMOBILIZER KNEE 22 UNIV (SOFTGOODS) IMPLANT
KIT BASIN OR (CUSTOM PROCEDURE TRAY) ×2 IMPLANT
KIT ROOM TURNOVER OR (KITS) ×2 IMPLANT
MANIFOLD NEPTUNE II (INSTRUMENTS) ×2 IMPLANT
NDL SUT 2 .5 CRC MAYO 1.732X (NEEDLE) ×1 IMPLANT
NEEDLE HYPO 25GX1X1/2 BEV (NEEDLE) ×2 IMPLANT
NEEDLE MAYO TAPER (NEEDLE) ×1
NS IRRIG 1000ML POUR BTL (IV SOLUTION) ×2 IMPLANT
PACK TOTAL JOINT (CUSTOM PROCEDURE TRAY) ×2 IMPLANT
PAD ARMBOARD 7.5X6 YLW CONV (MISCELLANEOUS) ×4 IMPLANT
PIN STEINMAN 3/16 (PIN) ×2 IMPLANT
SET HNDPC FAN SPRY TIP SCT (DISPOSABLE) IMPLANT
SPONGE GAUZE 4X4 12PLY (GAUZE/BANDAGES/DRESSINGS) ×2 IMPLANT
SPONGE LAP 4X18 X RAY DECT (DISPOSABLE) ×4 IMPLANT
STAPLER VISISTAT 35W (STAPLE) ×2 IMPLANT
STRIP CLOSURE SKIN 1/2X4 (GAUZE/BANDAGES/DRESSINGS) ×4 IMPLANT
SUCTION FRAZIER TIP 10 FR DISP (SUCTIONS) ×2 IMPLANT
SUT ETHIBOND NAB CT1 #1 30IN (SUTURE) ×6 IMPLANT
SUT TICRON (SUTURE) ×4 IMPLANT
SUT VIC AB 2-0 CT1 27 (SUTURE) ×3
SUT VIC AB 2-0 CT1 TAPERPNT 27 (SUTURE) ×3 IMPLANT
SUT VICRYL 0 TIES 12 18 (SUTURE) ×2 IMPLANT
SYR CONTROL 10ML LL (SYRINGE) ×2 IMPLANT
TAPE CLOTH SURG 6X10 WHT LF (GAUZE/BANDAGES/DRESSINGS) ×2 IMPLANT
TOWEL OR 17X24 6PK STRL BLUE (TOWEL DISPOSABLE) ×2 IMPLANT
TOWEL OR 17X26 10 PK STRL BLUE (TOWEL DISPOSABLE) ×2 IMPLANT
TOWER CARTRIDGE SMART MIX (DISPOSABLE) IMPLANT
TRAY FOLEY CATH 14FR (SET/KITS/TRAYS/PACK) IMPLANT
WATER STERILE IRR 1000ML POUR (IV SOLUTION) ×8 IMPLANT

## 2012-05-20 NOTE — Anesthesia Preprocedure Evaluation (Addendum)
Anesthesia Evaluation  Patient identified by MRN, date of birth, ID band Patient awake    Reviewed: Allergy & Precautions, H&P , NPO status , Patient's Chart, lab work & pertinent test results  History of Anesthesia Complications Negative for: history of anesthetic complications  Airway Mallampati: I      Dental   Pulmonary neg pulmonary ROS,  breath sounds clear to auscultation        Cardiovascular hypertension, Pt. on medications + Valvular Problems/Murmurs Rhythm:Regular Rate:Normal  ekg NSR c LBBB hx of " murmur" awaiting echo    Neuro/Psych PSYCHIATRIC DISORDERS Anxiety Depression History of Bell's Palsy.    GI/Hepatic negative GI ROS, Neg liver ROS,   Endo/Other  diabetes, Well Controlled, Type 2  Renal/GU negative Renal ROS     Musculoskeletal   Abdominal   Peds  Hematology   Anesthesia Other Findings   Reproductive/Obstetrics                          Anesthesia Physical Anesthesia Plan  ASA: III  Anesthesia Plan: General   Post-op Pain Management:    Induction: Intravenous  Airway Management Planned: Oral ETT  Additional Equipment:   Intra-op Plan:   Post-operative Plan: Possible Post-op intubation/ventilation  Informed Consent:   Dental advisory given  Plan Discussed with: CRNA, Anesthesiologist and Surgeon  Anesthesia Plan Comments:         Anesthesia Quick Evaluation

## 2012-05-20 NOTE — Progress Notes (Signed)
Echocardiogram discussed with Dr. Patty Sermons.  LVEF 50%, normal wall motion, no wall motion abnormalities. Very mild aortic stenosis.  Patient denies cardiac history, no recent chest pain. As per Dr. Antionette Poles H&P suggest no further evaluation.  [name], MD [facility] [phone]

## 2012-05-20 NOTE — Op Note (Signed)
[name], [name]                ACCOUNT NO.:  [id number]  MEDICAL RECORD NO.:  [id number]  LOCATION:  3W30C                        FACILITY:  [facility]  PHYSICIAN:  Mark C. Ophelia Charter, M.D.    DATE OF BIRTH:  March 22, 1934  DATE OF PROCEDURE:  05/20/2012 DATE OF DISCHARGE:                              OPERATIVE REPORT   PREOPERATIVE DIAGNOSIS:  Displaced left femoral neck fracture.  POSTOPERATIVE DIAGNOSIS:  Displaced left femoral neck fracture.  PROCEDURE: 1. Left hip hemiarthroplasty, DePuy press-fit. 2. Summit basic stem, +0 neck, 46 mm ball.  SURGEON:  Mark C. Ophelia Charter, MD  ASSISTANT:  Maud Deed, PA-C, medically necessary and present for the entire procedure.  EBL:  150 mL blood, 1 unit packed cells.  DESCRIPTION OF PROCEDURE:  After induction of general anesthesia, orotracheal intubation, 2 g Ancef prophylaxis, the patient placed in the lateral position.  A 10 x 15 drape was applied.  The patient already had a Foley catheter.  Hip was prepped with DuraPrep.  The usual straight sheets, sticky impervious blue U drape and split sheets and drapes were applied, impervious stockinette, Coban, sterile skin marker for posterior approach, and Betadine, Steri-Drape x2 sealing the skin.  Time- out procedure was completed.  Posterior approach was made.  Gluteus maximus was split in line with the fibers.  The patient received Lovenox and had capsular hematoma.  Capsule was opened, blood was evacuated. Neck was cut 1 fingerbreadth above the lesser trochanter.  There was noted a small groove in the calcar on the posterior aspect, which was not a fracture, did not extend medially and may have occurred at the time of injury when sharp edges of the proximal piece hit the edge of the cortex.  The calcar was carefully inspected posteriorly, was intact. Head was removed with corkscrew.  Trial sizing showed 46 was good, good suction fit with 46, 45 was too small, 47 will not fit.   Lateralizer, canal finer, single reamer, and then broaching up to a #2 size which gave excellent fit in this petite female, +5 was too tight, could not be reduced, +0 gave reconstruction of her neck length.  Leg lengths were equal in the thigh.  Quad was not tight.  She was able be flex to 80 degrees, internally rotated 90 degrees without subluxation.  There was good suction fit.  The hip was then dislocated using the bone hook. Trial was removed.  The entire field was irrigated including the canal. Permanent stem was placed together.  Neck was sucked into the ball. Trunnion was cleaned.  Head was reduced and impacted with the hammer. The stem collar was flushed with the calcar.  Again the area where the cortex had slight groove was inspected and it was normal.  The hip was reduced.  Identical findings of stability.  Capsule was repaired that had been opened in a T back with #1 Ethibond, piriformis superior to gluteus medius tendon with #1 Ethibond, 0 Vicryl, and #1 Vicryl in the gluteus maximus fascia and tensor fascia, 2- 0 Vicryl in subcutaneous tissue, skin staple closure, postop dressing, and knee immobilizer.  Marking was infiltrated in the skin at the end of the procedure,  4x4s and tape were applied.     Mark C. Ophelia Charter, M.D.     MCY/MEDQ  D:  05/20/2012  T:  05/20/2012  Job:  [id number]

## 2012-05-20 NOTE — Transfer of Care (Signed)
Immediate Anesthesia Transfer of Care Note  Patient: [name]  Procedure(s) Performed: Procedure(s) (LRB) with comments: ARTHROPLASTY BIPOLAR HIP (Left) - Left monopolar hemiarthroplasty  Patient Location: PACU  Anesthesia Type:General  Level of Consciousness: awake, alert , oriented and patient cooperative  Airway & Oxygen Therapy: Patient Spontanous Breathing and Patient connected to nasal cannula oxygen  Post-op Assessment: Report given to PACU RN, Post -op Vital signs reviewed and stable and Patient moving all extremities  Post vital signs: Reviewed and stable  Complications: No apparent anesthesia complications

## 2012-05-20 NOTE — Progress Notes (Signed)
[facility] PROGRESS NOTE  [name] YNW:[id number] DOB: [birth year] DOA: 05/19/2012 PCP: Vennie Homans, MD  Assessment/Plan: 1. Left hip fracture status post mechanical fall: No history of coronary disease or recent chest pain. EKG did show left bundle branch block, no old for comparison. 2-D echocardiogram was reassuring. For surgery today. Management per BX. 2. Hyponatremia: Likely secondary to Celexa. Appears stable. No further evaluation suggested. 3. Anemia: Baseline unknown. Monitor. Check CBC 1/25. 4. Diabetes mellitus type 2: Sliding scale insulin. Resume metformin on discharge. 5. Left bundle branch block: Chronicity unclear, no further evaluation suggested.  Code Status: Full code Family Communication: None present Disposition Plan: Pending therapy evaluations  [name], MD  [facility] Team 5 Pager [phone] If 7PM-7AM, please contact night-coverage at [email or web address], password [facility] 05/20/2012, 1:55 PM  LOS: 1 day   Brief narrative: 79 year old female who presents to the emergency room at [facility] after a fall. The patient states that she fell after she tripped over couple of steps while saying goodbye to her son. The patient was not paying attention while she was walking lost her balance and fell. She denied any head injury loss of consciousness neck injury back injury chest pain shortness of breath abdominal pain nausea vomiting. She denies any cardiac history and denies any history of coronary artery disease although she is a diabetic. Her EKG shows left bundle-branch block but she is unaware often denies ever having an MI . She was transferred to [facility] for surgery by Dr. Ophelia Charter for her hip fracture  Consultants:  Orthopedics  Procedures:  Left hip hemiarthroplasty, posterior approach  HPI/Subjective: Seen preoperatively. Feels okay. Denies any history of chest pain or heart disease.  Objective: Filed Vitals:   05/19/12  1418 05/19/12 2003 05/20/12 0447 05/20/12 1241  BP: 160/71 140/56 142/55   Pulse: 79 78 71   Temp: 99.4 F (37.4 C) 98.6 F (37 C) 98.5 F (36.9 C) 97.1 F (36.2 C)  TempSrc: Oral Oral Oral   Resp: 18     Weight:   57.335 kg (126 lb 6.4 oz)   SpO2: 94% 93% 97%     Intake/Output Summary (Last 24 hours) at 05/20/12 1355 Last data filed at 05/20/12 1230  Gross per 24 hour  Intake    950 ml  Output   2800 ml  Net  -1850 ml   Filed Weights   05/20/12 0447  Weight: 57.335 kg (126 lb 6.4 oz)    Exam:  General:  Appears calm and comfortable Cardiovascular: RRR, no m/r/g. No LE edema. Respiratory: CTA bilaterally, no w/r/r. Normal respiratory effort. Psychiatric: grossly normal mood and affect, speech fluent and appropriate  Data Reviewed: Basic Metabolic Panel:  Lab 05/20/12 6295 05/19/12 1512  NA 129* 131*  K 3.7 3.9  CL 93* 93*  CO2 26 25  GLUCOSE 100* 110*  BUN 10 9  CREATININE 0.60 0.50  CALCIUM 8.9 9.5  MG -- --  PHOS -- --   Liver Function Tests:  Lab 05/20/12 0241 05/19/12 1512  AST 27 31  ALT 15 18  ALKPHOS 48 57  BILITOT 0.6 0.8  PROT 7.6 8.5*  ALBUMIN 3.1* 3.8   CBC:  Lab 05/20/12 0241 05/19/12 1512  WBC 5.8 8.7  NEUTROABS -- --  HGB 9.1* 10.6*  HCT 26.5* 30.5*  MCV 82.8 82.4  PLT 179 199   Cardiac Enzymes:  Lab 05/20/12 0241 05/19/12 2018 05/19/12 1512  CKTOTAL -- -- --  CKMB -- -- --  CKMBINDEX -- -- --  TROPONINI <0.30 <0.30 <0.30   CBG:  Lab 05/20/12 1242 05/20/12 0728 05/19/12 2041 05/19/12 1647 05/19/12 1445  GLUCAP 93 117* 121* 174* 97    Studies: No results found.  Scheduled Meds:   . chlorhexidine      . [MAR HOLD] enoxaparin (LOVENOX) injection  40 mg Subcutaneous Q24H  . HYDROmorphone      . [MAR HOLD] insulin aspart  0-9 Units Subcutaneous TID WC  . methocarbamol (ROBAXIN) IV  500 mg Intravenous To PACU  . [facility] HOLD] sodium chloride  3 mL Intravenous Q12H   Continuous Infusions:   . sodium chloride 50 mL/hr  at 05/19/12 1525  . lactated ringers 50 mL/hr at 05/20/12 1610    Principal Problem:  *Hip fracture Active Problems:  Hypertension  Type 2 diabetes mellitus     [name], MD  [facility] Team 5 Pager [phone] If 7PM-7AM, please contact night-coverage at [email or web address], password [facility] 05/20/2012, 1:55 PM  LOS: 1 day   Time spent: 20 minutes

## 2012-05-20 NOTE — Brief Op Note (Cosign Needed)
05/19/2012 - 05/20/2012  12:11 PM  PATIENT:  [name]  79 y.o. female  PRE-OPERATIVE DIAGNOSIS:  Left hip fracture  POST-OPERATIVE DIAGNOSIS:  Left hip fracture  PROCEDURE:  Left hip hemiarthroplasty, posterior approach  SURGEON:  Surgeon(s) and Role:    * Eldred Manges, MD - Primary  PHYSICIAN ASSISTANT: Maud Deed [facility]  ASSISTANTS: none   ANESTHESIA:   general  EBL:  Total I/O In: 350 [Blood:350] Out: 2350 [Urine:2200; Blood:150]  BLOOD ADMINISTERED:none  DRAINS: none   LOCAL MEDICATIONS USED:  NONE  SPECIMEN:  No Specimen  DISPOSITION OF SPECIMEN:  N/A  COUNTS:  YES  TOURNIQUET:  * No tourniquets in log *  DICTATION: .Note written in EPIC  PLAN OF CARE: Admit to inpatient   PATIENT DISPOSITION:  PACU - hemodynamically stable.   Delay start of Pharmacological VTE agent (>24hrs) due to surgical blood loss or risk of bleeding: no

## 2012-05-20 NOTE — Progress Notes (Signed)
Orthopedic Tech Progress Note Patient Details:  [name] 1934/03/14 [id number]  Patient ID: [name], female   DOB: [birth year], 79 y.o.   MRN: [id number] Pt currently has icu bed ;when regular hill rom bed is provided ,ohf will be attached to bed;rn notified  Nikki Dom 05/20/2012, 5:44 PM

## 2012-05-20 NOTE — H&P (View-Only) (Signed)
Patient ID: [name], female   DOB: 06/22/1933, 78 y.o.   MRN: [id number] Surgery Friday at about 10 AM or 10:30AM.    NPO and pre-op Ancef ordered.  All ?'s answered ,  4 children at bedside. Discussed anterior vs posterior approach planned.   

## 2012-05-20 NOTE — Interval H&P Note (Signed)
History and Physical Interval Note:  05/20/2012 9:28 AM  [name]  has presented today for surgery, with the diagnosis of Left hip fracture  The various methods of treatment have been discussed with the patient and family. After consideration of risks, benefits and other options for treatment, the patient has consented to  Procedure(s) (LRB) with comments: ARTHROPLASTY BIPOLAR HIP (Left) - Left anterior approach monopolar hemiarthroplasty as a surgical intervention .  The patient's history has been reviewed, patient examined, no change in status, stable for surgery.  I have reviewed the patient's chart and labs.  Questions were answered to the patient's satisfaction.     YATES,MARK C

## 2012-05-20 NOTE — Anesthesia Postprocedure Evaluation (Signed)
  Anesthesia Post-op Note  Patient: [name]  Procedure(s) Performed: Procedure(s) (LRB) with comments: ARTHROPLASTY BIPOLAR HIP (Left) - Left monopolar hemiarthroplasty  Patient Location: PACU  Anesthesia Type:General  Level of Consciousness: awake  Airway and Oxygen Therapy: Patient Spontanous Breathing  Post-op Pain: mild  Post-op Assessment: Post-op Vital signs reviewed  Post-op Vital Signs: Reviewed  Complications: No apparent anesthesia complications

## 2012-05-20 NOTE — Progress Notes (Signed)
  Echocardiogram 2D Echocardiogram has been performed.  [name] 05/20/2012, 10:25 AM

## 2012-05-20 NOTE — Progress Notes (Signed)
ANTICOAGULATION CONSULT NOTE - Initial Consult  Pharmacy Consult for Coumadin Indication: VTE prophylaxis  No Known Allergies  Patient Measurements: Weight: 126 lb 6.4 oz (57.335 kg)   Vital Signs: Temp: 97 F (36.1 C) (01/24 1412) Temp src: Oral (01/24 0447) BP: 143/46 mmHg (01/24 1406) Pulse Rate: 68  (01/24 1406)  Labs:  Basename 05/20/12 0241 05/19/12 2018 05/19/12 1512  HGB 9.1* -- 10.6*  HCT 26.5* -- 30.5*  PLT 179 -- 199  APTT -- -- --  LABPROT -- -- --  INR -- -- --  HEPARINUNFRC -- -- --  CREATININE 0.60 -- 0.50  CKTOTAL -- -- --  CKMB -- -- --  TROPONINI <0.30 <0.30 <0.30    CrCl is unknown because there is no height on file for the current visit.   Medical History: Past Medical History  Diagnosis Date  . Heart murmur   . Hypertension   . Depression   . Diabetes mellitus without complication     type 2  . Bell's palsy   . Arthritis   . Anemia     Medications:  Scheduled:    . acetaminophen  1,000 mg Intravenous Q6H  . amLODipine  5 mg Oral Daily  . [COMPLETED]  ceFAZolin (ANCEF) IV  1 g Intravenous On Call to OR  . [COMPLETED]  ceFAZolin (ANCEF) IV  2 g Intravenous Once  .  ceFAZolin (ANCEF) IV  2 g Intravenous Q6H  . chlorhexidine      . citalopram  10 mg Oral Daily  . docusate sodium  100 mg Oral BID  . enoxaparin (LOVENOX) injection  40 mg Subcutaneous Q24H  . HYDROmorphone      . insulin aspart  0-9 Units Subcutaneous TID WC  . lisinopril  20 mg Oral Daily  . loxapine  20 mg Oral Daily  . [COMPLETED] methocarbamol (ROBAXIN) IV  500 mg Intravenous To PACU  . potassium chloride SA  20 mEq Oral Daily  . sodium chloride  3 mL Intravenous Q12H  . [DISCONTINUED] enoxaparin (LOVENOX) injection  40 mg Subcutaneous Q24H    Assessment: 79 yr old female on coumadin for VTE prophylaxis s/p left hip hemiarthroplasty.  Goal of Therapy:  INR 2-3 Monitor platelets by anticoagulation protocol: Yes   Plan:  Coumadin 5mg  po x 1 dose  tonight. Daily PT/INR.  Wendie Simmer, PharmD, BCPS Clinical Pharmacist  Pager: [phone]

## 2012-05-21 LAB — BASIC METABOLIC PANEL
Calcium: 8.5 mg/dL (ref 8.4–10.5)
Chloride: 97 mEq/L (ref 96–112)
Creatinine, Ser: 0.66 mg/dL (ref 0.50–1.10)
GFR calc Af Amer: >90 mL/min (ref >90)

## 2012-05-21 LAB — CBC
MCV: 85.4 fL (ref 78.0–100.0)
Platelets: 162 10*3/uL (ref 150–400)
RDW: 13.9 % (ref 11.5–15.5)
WBC: 6.1 10*3/uL (ref 4.0–10.5)

## 2012-05-21 LAB — URINE CULTURE
Colony Count: NO GROWTH
Culture: NO GROWTH

## 2012-05-21 LAB — GLUCOSE, CAPILLARY
Glucose-Capillary: 108 mg/dL — ABNORMAL HIGH (ref 70–99)
Glucose-Capillary: 168 mg/dL — ABNORMAL HIGH (ref 70–99)
Glucose-Capillary: 118 mg/dL — ABNORMAL HIGH (ref 70–99)
Glucose-Capillary: 169 mg/dL — ABNORMAL HIGH (ref 70–99)

## 2012-05-21 LAB — PROTIME-INR: Prothrombin Time: 13.4 seconds (ref 11.6–15.2)

## 2012-05-21 MED ORDER — WARFARIN SODIUM 5 MG PO TABS
5.0000 mg | ORAL_TABLET | Freq: Once | ORAL | Status: AC
  Administered 2012-05-21: 5 mg via ORAL
  Filled 2012-05-21: qty 1

## 2012-05-21 MED ORDER — OXYCODONE HCL 5 MG PO TABS
5.0000 mg | ORAL_TABLET | ORAL | Status: DC | PRN
  Administered 2012-05-21 – 2012-05-23 (×6): 5 mg via ORAL
  Filled 2012-05-21 (×6): qty 1

## 2012-05-21 MED ORDER — ACETAMINOPHEN 325 MG PO TABS
650.0000 mg | ORAL_TABLET | Freq: Four times a day (QID) | ORAL | Status: DC | PRN
  Administered 2012-05-21 – 2012-05-25 (×7): 650 mg via ORAL
  Filled 2012-05-21 (×8): qty 2

## 2012-05-21 NOTE — Progress Notes (Signed)
[facility] PROGRESS NOTE  Karin Pinedo [id number]:[id number] DOB: 16-Jul-1933 DOA: 05/19/2012 PCP: Vennie Homans, MD  Assessment/Plan: 1. Left hip fracture status post mechanical fall: Status post surgery. On warfarin for DVT prophylaxis per orthopedics. Activity per orthopedics. 2. Mild hyponatremia: Asymptomatic. Likely secondary to Celexa. Appears stable. No further evaluation suggested. 3. Anemia: Baseline unknown. Slight decrease post operatively. Check CBC 1/26. 4. Diabetes mellitus type 2: Stable. Continue sliding scale insulin. Resume metformin on discharge. 5. Left bundle branch block: Chronicity unclear, no further evaluation suggested.  Code Status: Full code Family Communication: None present Disposition Plan: Pending therapy evaluations  [name], MD  [facility] Team 5 Pager [phone] If 7PM-7AM, please contact night-coverage at [email or web address], password [facility] 05/21/2012, 9:32 AM  LOS: 2 days   Brief narrative: 79 year old female who presents to the emergency room at [facility] after a fall. The patient states that she fell after she tripped over couple of steps while saying goodbye to her son. The patient was not paying attention while she was walking lost her balance and fell. She denied any head injury loss of consciousness neck injury back injury chest pain shortness of breath abdominal pain nausea vomiting. She denies any cardiac history and denies any history of coronary artery disease although she is a diabetic. Her EKG shows left bundle-branch block but she is unaware often denies ever having an MI. She was transferred to [facility] for surgery by Dr. Ophelia Charter for her hip fracture  Consultants:  Orthopedics  Procedures:  Left hip hemiarthroplasty, posterior approach  HPI/Subjective: No chest pain or shortness of breath. Some hip pain. Slept well. Motivated to improve.  Objective: Filed Vitals:   05/20/12 1406 05/20/12 1412 05/20/12  2100 05/21/12 0500  BP: 143/46  108/57 144/77  Pulse: 68  68 93  Temp:  97 F (36.1 C) 98.4 F (36.9 C) 99.2 F (37.3 C)  TempSrc:      Resp: 18  20 18   Weight:    58.968 kg (130 lb)  SpO2: 96%  97% 96%    Intake/Output Summary (Last 24 hours) at 05/21/12 0932 Last data filed at 05/21/12 0500  Gross per 24 hour  Intake 1137.5 ml  Output   1150 ml  Net  -12.5 ml   Filed Weights   05/20/12 0447 05/21/12 0500  Weight: 57.335 kg (126 lb 6.4 oz) 58.968 kg (130 lb)    Exam:  General:  Appears calm and comfortable Cardiovascular: RRR, no m/r/g. No LE edema. Moves both feet command.  Telemetry: Sinus rhythm, no arrhythmias. Respiratory: CTA bilaterally, no w/r/r. Normal respiratory effort. Psychiatric: grossly normal mood and affect, speech fluent and appropriate  Data Reviewed: Basic Metabolic Panel:  Lab 05/21/12 6440 05/20/12 1520 05/20/12 0241 05/19/12 1512  NA 131* -- 129* 131*  K 4.7 -- 3.7 3.9  CL 97 -- 93* 93*  CO2 28 -- 26 25  GLUCOSE 104* -- 100* 110*  BUN 9 -- 10 9  CREATININE 0.66 0.57 0.60 0.50  CALCIUM 8.5 -- 8.9 9.5  MG -- -- -- --  PHOS -- -- -- --   Liver Function Tests:  Lab 05/20/12 0241 05/19/12 1512  AST 27 31  ALT 15 18  ALKPHOS 48 57  BILITOT 0.6 0.8  PROT 7.6 8.5*  ALBUMIN 3.1* 3.8   CBC:  Lab 05/21/12 0510 05/20/12 1520 05/20/12 0241 05/19/12 1512  WBC 6.1 8.0 5.8 8.7  NEUTROABS -- -- -- --  HGB 8.9* 9.9* 9.1* 10.6*  HCT  26.4* 29.2* 26.5* 30.5*  MCV 85.4 84.9 82.8 82.4  PLT 162 159 179 199   Cardiac Enzymes:  Lab 05/20/12 0241 05/19/12 2018 05/19/12 1512  CKTOTAL -- -- --  CKMB -- -- --  CKMBINDEX -- -- --  TROPONINI <0.30 <0.30 <0.30   CBG:  Lab 05/21/12 0748 05/20/12 2106 05/20/12 1618 05/20/12 1242 05/20/12 0728  GLUCAP 108* 125* 136* 93 117*    Studies: Dg Pelvis Portable  05/20/2012  *RADIOLOGY REPORT*  Clinical Data: Postop left hip replacement  PORTABLE PELVIS  Comparison: Preoperative films of 05/19/2012   Findings: A left hip hemiarthroplasty has been performed.  No complicating features are evident.  The femoral stem appears to be in good position. No abnormality of the right hip is seen.  There is degenerative change noted in the lower lumbar spine.  IMPRESSION: Left hip hemiarthroplasty is now present with no complicating features.   Original Report Authenticated By: Dwyane Dee, M.D.     Scheduled Meds:    . amLODipine  5 mg Oral Daily  . citalopram  10 mg Oral Daily  . docusate sodium  100 mg Oral BID  . enoxaparin (LOVENOX) injection  40 mg Subcutaneous Q24H  . insulin aspart  0-9 Units Subcutaneous TID WC  . lisinopril  20 mg Oral Daily  . loxapine  20 mg Oral QHS  . potassium chloride SA  20 mEq Oral Daily  . sodium chloride  3 mL Intravenous Q12H  . warfarin  5 mg Oral ONCE-1800  . Warfarin - Pharmacist Dosing Inpatient   Does not apply q1800   Continuous Infusions:   Principal Problem:  *Hip fracture Active Problems:  Hypertension  Type 2 diabetes mellitus  Hyponatremia  Anemia  Left bundle branch block     [name], MD  [facility] Team 5 Pager [phone] If 7PM-7AM, please contact night-coverage at [email or web address], password [facility] 05/21/2012, 9:32 AM  LOS: 2 days   Time spent: 15 minutes

## 2012-05-21 NOTE — Progress Notes (Signed)
ANTICOAGULATION CONSULT NOTE - Initial Consult  Pharmacy Consult for Coumadin Indication: VTE prophylaxis  No Known Allergies  Patient Measurements: Weight: 130 lb (58.968 kg)   Vital Signs: Temp: 99.2 F (37.3 C) (01/25 0500) BP: 144/77 mmHg (01/25 0500) Pulse Rate: 93  (01/25 0500)  Labs:  Basename 05/21/12 0510 05/20/12 1520 05/20/12 0241 05/19/12 2018 05/19/12 1512  HGB 8.9* 9.9* -- -- --  HCT 26.4* 29.2* 26.5* -- --  PLT 162 159 179 -- --  APTT -- -- -- -- --  LABPROT 13.4 13.5 -- -- --  INR 1.03 1.04 -- -- --  HEPARINUNFRC -- -- -- -- --  CREATININE 0.66 0.57 0.60 -- --  CKTOTAL -- -- -- -- --  CKMB -- -- -- -- --  TROPONINI -- -- <0.30 <0.30 <0.30    CrCl is unknown because there is no height on file for the current visit.   Medical History: Past Medical History  Diagnosis Date  . Heart murmur   . Hypertension   . Depression   . Diabetes mellitus without complication     type 2  . Bell's palsy   . Arthritis   . Anemia     Medications:  Scheduled:     . acetaminophen  1,000 mg Intravenous Q6H  . amLODipine  5 mg Oral Daily  . [COMPLETED]  ceFAZolin (ANCEF) IV  1 g Intravenous On Call to OR  . [COMPLETED]  ceFAZolin (ANCEF) IV  2 g Intravenous Q6H  . [EXPIRED] chlorhexidine      . citalopram  10 mg Oral Daily  . docusate sodium  100 mg Oral BID  . enoxaparin (LOVENOX) injection  40 mg Subcutaneous Q24H  . [EXPIRED] HYDROmorphone      . insulin aspart  0-9 Units Subcutaneous TID WC  . lisinopril  20 mg Oral Daily  . loxapine  20 mg Oral QHS  . [COMPLETED] methocarbamol (ROBAXIN) IV  500 mg Intravenous To PACU  . [COMPLETED] patient's guide to using coumadin book   Does not apply Once  . potassium chloride SA  20 mEq Oral Daily  . sodium chloride  3 mL Intravenous Q12H  . [COMPLETED] warfarin  5 mg Oral ONCE-1800  . Warfarin - Pharmacist Dosing Inpatient   Does not apply q1800  . [DISCONTINUED] enoxaparin (LOVENOX) injection  40 mg  Subcutaneous Q24H  . [DISCONTINUED] loxapine  20 mg Oral Daily    Assessment: 79 yr old female on coumadin for VTE prophylaxis s/p left hip hemiarthroplasty.  INR below goal at 1.03.   Goal of Therapy:  INR 2-3 Monitor platelets by anticoagulation protocol: Yes   Plan:  Coumadin 5mg  po x 1 dose tonight. Daily PT/INR.  Wendie Simmer, PharmD, BCPS Clinical Pharmacist  Pager: [phone]

## 2012-05-21 NOTE — Progress Notes (Signed)
Pt foley dc, pt voiding

## 2012-05-21 NOTE — Progress Notes (Signed)
Subjective: 1 Day Post-Op Procedure(s) (LRB): ARTHROPLASTY BIPOLAR HIP (Left) Patient reports pain as mild.    Objective: Vital signs in last 24 hours: Temp:  [97 F (36.1 C)-99.2 F (37.3 C)] 99.2 F (37.3 C) (01/25 0500) Pulse Rate:  [68-93] 93  (01/25 0500) Resp:  [16-28] 17  (01/25 1200) BP: (108-165)/(41-77) 144/77 mmHg (01/25 0500) SpO2:  [94 %-100 %] 98 % (01/25 1200) Weight:  [58.968 kg (130 lb)] 58.968 kg (130 lb) (01/25 0500)  Intake/Output from previous day: 01/24 0701 - 01/25 0700 In: 1800.8 [I.V.:1450.8; Blood:350] Out: 2750 [Urine:2600; Blood:150] Intake/Output this shift:     Basename 05/21/12 0510 05/20/12 1520 05/20/12 0241 05/19/12 1512  HGB 8.9* 9.9* 9.1* 10.6*    Basename 05/21/12 0510 05/20/12 1520  WBC 6.1 8.0  RBC 3.09* 3.44*  HCT 26.4* 29.2*  PLT 162 159    Basename 05/21/12 0510 05/20/12 1520 05/20/12 0241  NA 131* -- 129*  K 4.7 -- 3.7  CL 97 -- 93*  CO2 28 -- 26  BUN 9 -- 10  CREATININE 0.66 0.57 --  GLUCOSE 104* -- 100*  CALCIUM 8.5 -- 8.9    Basename 05/21/12 0510 05/20/12 1520  LABPT -- --  INR 1.03 1.04    Neurologically intact  Assessment/Plan: 1 Day Post-Op Procedure(s) (LRB): ARTHROPLASTY BIPOLAR HIP (Left) Up with therapy     Ready for therapy.  D/c foley  Adamarie Izzo C 05/21/2012, 12:20 PM

## 2012-05-21 NOTE — Evaluation (Signed)
Physical Therapy Evaluation Patient Details Name: [name] MRN: [id number] DOB: Jul 24, 1933 Today's Date: 05/21/2012 Time: 4098-1191 PT Time Calculation (min): 25 min  PT Assessment / Plan / Recommendation Clinical Impression  Patient is a 79 yo female s/p Lt. hip hemiarthroplasty following fall/fracture.  Will benefit from acute PT to maximize independence prior to discharge.  Patient has 24 hour assistance.  Recommend HHPT for continued therapy.    PT Assessment  Patient needs continued PT services    Follow Up Recommendations  Home health PT;Supervision/Assistance - 24 hour    Does the patient have the potential to tolerate intense rehabilitation      Barriers to Discharge None      Equipment Recommendations  None recommended by PT    Recommendations for Other Services     Frequency 7X/week    Precautions / Restrictions Precautions Precautions: Posterior Hip;Fall Precaution Booklet Issued: Yes (comment) Precaution Comments: Reviewed precautions with patient and provided handout. Restrictions Weight Bearing Restrictions: Yes LLE Weight Bearing: Weight bearing as tolerated   Pertinent Vitals/Pain Pain limiting mobility today.      Mobility  Bed Mobility Bed Mobility: Supine to Sit;Sitting - Scoot to Edge of Bed Supine to Sit: 4: Min assist;HOB flat Sitting - Scoot to [place] of Bed: 4: Min guard Details for Bed Mobility Assistance: Verbal cues for technique.  Assist to move LLE off of bed. Transfers Transfers: Sit to Stand;Stand to Sit Sit to Stand: 3: Mod assist;With upper extremity assist;From bed Stand to Sit: 4: Min assist;With upper extremity assist;With armrests;To chair/3-in-1 Details for Transfer Assistance: Verbal cues for technique, hand placement, and placement of LLE.  Assist to rise to standing, and to control descent to chair.   Ambulation/Gait Ambulation/Gait Assistance: 4: Min assist Ambulation Distance (Feet): 16 Feet Assistive device: Rolling  walker Ambulation/Gait Assistance Details: Verbal cues for gait sequence and safe use of RW.  Cues to stand tall during gait. Gait Pattern: Step-to pattern;Decreased stance time - left;Decreased step length - right;Right flexed knee in stance;Left flexed knee in stance;Antalgic;Trunk flexed Gait velocity: Slow gait speed       Exercises Total Joint Exercises Ankle Circles/Pumps: AROM;Both;10 reps;Seated   PT Diagnosis: Difficulty walking;Abnormality of gait;Generalized weakness;Acute pain  PT Problem List: Decreased strength;Decreased range of motion;Decreased activity tolerance;Decreased balance;Decreased mobility;Decreased knowledge of use of DME;Decreased knowledge of precautions;Pain PT Treatment Interventions: DME instruction;Gait training;Functional mobility training;Therapeutic exercise;Patient/family education   PT Goals Acute Rehab PT Goals PT Goal Formulation: With patient Time For Goal Achievement: 05/28/12 Potential to Achieve Goals: Good Pt will go Supine/Side to Sit: Independently;with HOB 0 degrees PT Goal: Supine/Side to Sit - Progress: Goal set today Pt will go Sit to Supine/Side: Independently;with HOB 0 degrees PT Goal: Sit to Supine/Side - Progress: Goal set today Pt will go Sit to Stand: with supervision;with upper extremity assist PT Goal: Sit to Stand - Progress: Goal set today Pt will go Stand to Sit: with supervision;with upper extremity assist PT Goal: Stand to Sit - Progress: Goal set today Pt will Ambulate: 51 - 150 feet;with supervision;with rolling walker PT Goal: Ambulate - Progress: Goal set today Pt will Perform Home Exercise Program: with supervision, verbal cues required/provided PT Goal: Perform Home Exercise Program - Progress: Goal set today  Visit Information  Last PT Received On: 05/21/12 Assistance Needed: +2    Subjective Data  Subjective: Patient laughing during session.  "I was walking on the steps like my kids told me not to, and I  fell" Patient Stated Goal:  To return home.   Prior Functioning  Home Living Lives With: Son;Friend(s) Available Help at Discharge: Family;Friend(s);Available 24 hours/day Type of Home: House Home Access: Level entry Home Layout: Two level;Able to live on main level with bedroom/bathroom;Laundry or work area in basement [employer] Shower/Tub: Engineer, manufacturing systems: Standard Home Adaptive Equipment: Environmental consultant - rolling;Straight cane;Bedside commode/3-in-1;Shower chair with back Prior Function Level of Independence: Independent Able to Take Stairs?: Yes Driving: No Vocation: Retired Musician: No difficulties    Cognition  Overall Cognitive Status: Appears within functional limits for tasks assessed/performed Arousal/Alertness: Awake/alert Orientation Level: Appears intact for tasks assessed Behavior During Session: [facility] for tasks performed    Extremity/Trunk Assessment Right Upper Extremity Assessment RUE ROM/Strength/Tone: Within functional levels RUE Sensation: WFL - Light Touch Left Upper Extremity Assessment LUE ROM/Strength/Tone: Within functional levels LUE Sensation: WFL - Light Touch Right Lower Extremity Assessment RLE ROM/Strength/Tone: Deficits RLE ROM/Strength/Tone Deficits: General weakness 4/5 RLE Sensation: WFL - Light Touch Left Lower Extremity Assessment LLE ROM/Strength/Tone: Deficits;Unable to fully assess;Due to pain LLE ROM/Strength/Tone Deficits: Able to assist moving LLE off of bed.  Arthritic changes noted foot and knee. LLE Sensation: WFL - Light Touch   Balance    End of Session PT - End of Session Equipment Utilized During Treatment: Gait belt Activity Tolerance: Patient limited by pain Patient left: in chair;with call bell/phone within reach Nurse Communication: Mobility status;Weight bearing status (Posterior hip precautions)  GP     Vena Austria 05/21/2012, 3:29 PM Durenda Hurt. Renaldo Fiddler, [facility] Acute Rehab  Services Pager [phone]

## 2012-05-22 LAB — CBC
HCT: 25.7 % — ABNORMAL LOW (ref 36.0–46.0)
Hemoglobin: 8.6 g/dL — ABNORMAL LOW (ref 12.0–15.0)
RBC: 3.03 MIL/uL — ABNORMAL LOW (ref 3.87–5.11)
WBC: 8.4 10*3/uL (ref 4.0–10.5)

## 2012-05-22 LAB — PROTIME-INR
INR: 1.17 (ref 0.00–1.49)
Prothrombin Time: 14.7 seconds (ref 11.6–15.2)

## 2012-05-22 LAB — GLUCOSE, CAPILLARY: Glucose-Capillary: 105 mg/dL — ABNORMAL HIGH (ref 70–99)

## 2012-05-22 MED ORDER — WARFARIN SODIUM 5 MG PO TABS
5.0000 mg | ORAL_TABLET | Freq: Once | ORAL | Status: AC
  Administered 2012-05-22: 5 mg via ORAL
  Filled 2012-05-22: qty 1

## 2012-05-22 MED ORDER — PATIENT'S GUIDE TO USING COUMADIN BOOK
Freq: Once | Status: AC
  Administered 2012-05-22: 15:00:00
  Filled 2012-05-22: qty 1

## 2012-05-22 NOTE — Progress Notes (Signed)
ANTICOAGULATION CONSULT NOTE - Follow Up Consult  Pharmacy Consult for coumadin Indication: VTE prophylaxis  No Known Allergies  Patient Measurements: Weight: 130 lb (58.968 kg) Heparin Dosing Weight:   Vital Signs: Temp: 98.4 F (36.9 C) (01/26 0702) Temp src: Oral (01/26 0702) BP: 118/48 mmHg (01/26 0702) Pulse Rate: 86  (01/26 0702)  Labs:  Basename 05/22/12 0645 05/21/12 0510 05/20/12 1520 05/20/12 0241 05/19/12 2018 05/19/12 1512  HGB 8.6* 8.9* -- -- -- --  HCT 25.7* 26.4* 29.2* -- -- --  PLT 178 162 159 -- -- --  APTT -- -- -- -- -- --  LABPROT 14.7 13.4 13.5 -- -- --  INR 1.17 1.03 1.04 -- -- --  HEPARINUNFRC -- -- -- -- -- --  CREATININE -- 0.66 0.57 0.60 -- --  CKTOTAL -- -- -- -- -- --  CKMB -- -- -- -- -- --  TROPONINI -- -- -- <0.30 <0.30 <0.30    CrCl is unknown because there is no height on file for the current visit.    Assessment: 79 yr old female with hip fracture. INR 1.17 today.  Goal of Therapy:  INR 2-3 Monitor platelets by anticoagulation protocol: Yes   Plan:  Coumadin 5 mg today Daily PT/INR  Eugene Garnet 05/22/2012,12:52 PM

## 2012-05-22 NOTE — Progress Notes (Signed)
[facility] PROGRESS NOTE  Dicie Edelen WUJ:[id number] DOB: [birth year] DOA: 05/19/2012 PCP: Vennie Homans, MD  Assessment/Plan: 1. Left hip fracture status post mechanical fall: Status post surgery. On warfarin for DVT prophylaxis per orthopedics. Activity per orthopedics. 2. Mild hyponatremia: Asymptomatic. Likely secondary to Celexa. Appears stable. No further evaluation suggested. 3. Anemia: Stable status post surgery. Baseline unknown. Suspect acute postsurgical etiology superimposed on chronic anemia.  4. Diabetes mellitus type 2: Stable. Continue sliding scale insulin. Resume metformin on discharge. 5. Left bundle branch block: Chronicity unclear, no further evaluation suggested.  Code Status: Full code Family Communication: None present Disposition Plan: Home with home health 1/27.  [name], MD  [facility] Team 5 Pager [phone] If 7PM-7AM, please contact night-coverage at [email or web address], password [facility] 05/22/2012, 1:42 PM  LOS: 3 days   Brief narrative: 79 year old female who presents to the emergency room at [facility] after a fall. The patient states that she fell after she tripped over couple of steps while saying goodbye to her son. The patient was not paying attention while she was walking lost her balance and fell. She denied any head injury loss of consciousness neck injury back injury chest pain shortness of breath abdominal pain nausea vomiting. She denies any cardiac history and denies any history of coronary artery disease although she is a diabetic. Her EKG shows left bundle-branch block but she is unaware often denies ever having an MI. She was transferred to [facility] for surgery by Dr. Ophelia Charter for her hip fracture  Consultants:  Orthopedics   Physical therapy: Home health PT  Procedures:  Left hip hemiarthroplasty, posterior approach  HPI/Subjective: Some hip pain but doing well.  Objective: Filed Vitals:   05/22/12 0702  05/22/12 0800 05/22/12 1152 05/22/12 1329  BP: 118/48   162/52  Pulse: 86   93  Temp: 98.4 F (36.9 C)   98 F (36.7 C)  TempSrc: Oral     Resp: 18 20 18 18   Weight:      SpO2: 98% 97% 98% 95%    Intake/Output Summary (Last 24 hours) at 05/22/12 1342 Last data filed at 05/21/12 1500  Gross per 24 hour  Intake      0 ml  Output    400 ml  Net   -400 ml   Filed Weights   05/20/12 0447 05/21/12 0500  Weight: 57.335 kg (126 lb 6.4 oz) 58.968 kg (130 lb)    Exam:  General:  Appears calm and comfortable Cardiovascular: RRR, no m/r/g.  Respiratory: CTA bilaterally, no w/r/r. Normal respiratory effort. Psychiatric: grossly normal mood and affect, speech fluent and appropriate  Data Reviewed: Basic Metabolic Panel:  Lab 05/21/12 8657 05/20/12 1520 05/20/12 0241 05/19/12 1512  NA 131* -- 129* 131*  K 4.7 -- 3.7 3.9  CL 97 -- 93* 93*  CO2 28 -- 26 25  GLUCOSE 104* -- 100* 110*  BUN 9 -- 10 9  CREATININE 0.66 0.57 0.60 0.50  CALCIUM 8.5 -- 8.9 9.5  MG -- -- -- --  PHOS -- -- -- --   Liver Function Tests:  Lab 05/20/12 0241 05/19/12 1512  AST 27 31  ALT 15 18  ALKPHOS 48 57  BILITOT 0.6 0.8  PROT 7.6 8.5*  ALBUMIN 3.1* 3.8   CBC:  Lab 05/22/12 0645 05/21/12 0510 05/20/12 1520 05/20/12 0241 05/19/12 1512  WBC 8.4 6.1 8.0 5.8 8.7  NEUTROABS -- -- -- -- --  HGB 8.6* 8.9* 9.9* 9.1* 10.6*  HCT  25.7* 26.4* 29.2* 26.5* 30.5*  MCV 84.8 85.4 84.9 82.8 82.4  PLT 178 162 159 179 199   Cardiac Enzymes:  Lab 05/20/12 0241 05/19/12 2018 05/19/12 1512  CKTOTAL -- -- --  CKMB -- -- --  CKMBINDEX -- -- --  TROPONINI <0.30 <0.30 <0.30   CBG:  Lab 05/22/12 0705 05/21/12 2126 05/21/12 1658 05/21/12 1245 05/21/12 0748  GLUCAP 105* 169* 118* 168* 108*    Studies: No results found.  Scheduled Meds:    . amLODipine  5 mg Oral Daily  . citalopram  10 mg Oral Daily  . docusate sodium  100 mg Oral BID  . enoxaparin (LOVENOX) injection  40 mg Subcutaneous Q24H  .  insulin aspart  0-9 Units Subcutaneous TID WC  . lisinopril  20 mg Oral Daily  . loxapine  20 mg Oral QHS  . potassium chloride SA  20 mEq Oral Daily  . sodium chloride  3 mL Intravenous Q12H  . warfarin  5 mg Oral ONCE-1800  . Warfarin - Pharmacist Dosing Inpatient   Does not apply q1800   Continuous Infusions:   Principal Problem:  *Hip fracture Active Problems:  Hypertension  Type 2 diabetes mellitus  Hyponatremia  Anemia  Left bundle branch block     [name], MD  [facility] Team 5 Pager [phone] If 7PM-7AM, please contact night-coverage at [email or web address], password [facility] 05/22/2012, 1:42 PM  LOS: 3 days   Time spent: 15 minutes

## 2012-05-22 NOTE — Clinical Social Work Note (Signed)
CSW consult for SNF. Per PT, pt has 24 hour supervision in place at d/c and recommendation is HHPT. CSW signing off as no other CSW needs identified at this time. Please re-consult if CSW needs arise.  Dellie Burns, MSW, LCSWA [phone] (Weekends 8:00am-4:30pm)

## 2012-05-22 NOTE — Progress Notes (Signed)
Orthopedic Tech Progress Note Patient Details:  [name] [birth year] [id number]  Patient ID: [name], female   DOB: 01-01-1934, 78 y.o.   MRN: [id number]   [name] 05/22/2012, 12:46 PM Trapeze bar

## 2012-05-22 NOTE — Evaluation (Signed)
Occupational Therapy Evaluation Patient Details Name: [name] MRN: [id number] DOB: 02-May-1933 Today's Date: 05/22/2012 Time: 8657-8469 OT Time Calculation (min): 15 min  OT Assessment / Plan / Recommendation Clinical Impression  Pt admitted with Left hip fx s/p fall at home. Now s/p L THA.  Will benefit from continued OT services to address below problem list in prep for return home.    OT Assessment  Patient needs continued OT Services    Follow Up Recommendations  Home health OT;Supervision/Assistance - 24 hour    Barriers to Discharge      Equipment Recommendations  Tub/shower bench    Recommendations for Other Services    Frequency  Min 2X/week    Precautions / Restrictions Precautions Precautions: Posterior Hip;Fall Precaution Comments: Reviewed 3/3 posterior hip precautions with pt. Restrictions Weight Bearing Restrictions: Yes LLE Weight Bearing: Weight bearing as tolerated   Pertinent Vitals/Pain See vitals    ADL  Eating/Feeding: Performed;Independent Where Assessed - Eating/Feeding: Chair Grooming: Simulated;Wash/dry hands;Set up;Wash/dry face Where Assessed - Grooming: Supported sitting Upper Body Bathing: Simulated;Set up Where Assessed - Upper Body Bathing: Supported sitting Lower Body Bathing: Simulated;Moderate assistance Where Assessed - Lower Body Bathing: Supported sit to stand Upper Body Dressing: Simulated;Set up Where Assessed - Upper Body Dressing: Supported sitting Lower Body Dressing: Simulated;Moderate assistance Where Assessed - Lower Body Dressing: Supported sit to Pharmacist, hospital: Performed;Moderate assistance Toilet Transfer Method: Stand pivot;Sit to stand Toilet Transfer Equipment: Raised toilet seat with arms (or 3-in-1 over toilet) Toileting - Clothing Manipulation and Hygiene: Minimal assistance;Simulated Where Assessed - Toileting Clothing Manipulation and Hygiene: Sit to stand from 3-in-1 or toilet Equipment Used: Gait  belt;Rolling walker Transfers/Ambulation Related to ADLs: min assist with RW for SPT chair<>3n1 ADL Comments: Pt requires frequent cueing to maintain hip precautions during mobility.      OT Diagnosis: Generalized weakness;Acute pain  OT Problem List: Decreased strength;Pain;Decreased knowledge of precautions;Decreased knowledge of use of DME or AE;Decreased activity tolerance OT Treatment Interventions: Self-care/ADL training;DME and/or AE instruction;Therapeutic activities;Patient/family education   OT Goals Acute Rehab OT Goals OT Goal Formulation: With patient Time For Goal Achievement: 05/29/12 Potential to Achieve Goals: Good ADL Goals Pt Will Perform Grooming: with modified independence;Standing at sink ADL Goal: Grooming - Progress: Goal set today Pt Will Perform Lower Body Bathing: with modified independence;Sit to stand from chair;Sit to stand from bed;with adaptive equipment ADL Goal: Lower Body Bathing - Progress: Goal set today Pt Will Perform Lower Body Dressing: with modified independence;Sit to stand from chair;Sit to stand from bed;with adaptive equipment ADL Goal: Lower Body Dressing - Progress: Goal set today Pt Will Transfer to Toilet: with modified independence;Ambulation;with DME;Comfort height toilet;Maintaining hip precautions ADL Goal: Toilet Transfer - Progress: Goal set today Pt Will Perform Toileting - Clothing Manipulation: with modified independence;Sitting on 3-in-1 or toilet;Standing ADL Goal: Toileting - Clothing Manipulation - Progress: Goal set today Pt Will Perform Toileting - Hygiene: with modified independence;Sit to stand from 3-in-1/toilet ADL Goal: Toileting - Hygiene - Progress: Goal set today Pt Will Perform Tub/Shower Transfer: Tub transfer;Ambulation;with DME;Transfer tub bench;Maintaining hip precautions;with modified independence ADL Goal: Tub/Shower Transfer - Progress: Goal set today Miscellaneous OT Goals Miscellaneous OT Goal #1: Pt will  independently verbalize and generalize 3/3 posterior hip precautions during all ADLs. OT Goal: Miscellaneous Goal #1 - Progress: Goal set today  Visit Information  Last OT Received On: 05/22/12 Assistance Needed: +1    Subjective Data      Prior Functioning     Home  Living Lives With: Son;Friend(s) Available Help at Discharge: Family;Friend(s);Available 24 hours/day Type of Home: House Home Access: Level entry Home Layout: Two level;Able to live on main level with bedroom/bathroom;Laundry or work area in basement [employer] Shower/Tub: Engineer, manufacturing systems: Standard Home Adaptive Equipment: Environmental consultant - rolling;Straight cane;Bedside commode/3-in-1;Shower chair with back Prior Function Level of Independence: Independent Able to Take Stairs?: Yes Driving: No Vocation: Retired Musician: No difficulties         Vision/Perception     Cognition  Overall Cognitive Status: Appears within functional limits for tasks assessed/performed Arousal/Alertness: Awake/alert Orientation Level: Appears intact for tasks assessed Behavior During Session: [facility] for tasks performed    Extremity/Trunk Assessment Right Upper Extremity Assessment RUE ROM/Strength/Tone: [facility] for tasks assessed Left Upper Extremity Assessment LUE ROM/Strength/Tone: WFL for tasks assessed     Mobility Bed Mobility Bed Mobility: Not assessed Transfers Transfers: Sit to Stand;Stand to Sit Sit to Stand: 3: Mod assist;From chair/3-in-1;With armrests;With upper extremity assist Stand to Sit: 4: Min assist;To chair/3-in-1;With armrests;With upper extremity assist Details for Transfer Assistance: VC for safe hand placement and technique.  Pt requires assist to maintiain hip precautions (not bend past 90 degrees) and to provide stability.     Shoulder Instructions     Exercise    Balance     End of Session OT - End of Session Equipment Utilized During Treatment: Gait belt Activity  Tolerance: Patient tolerated treatment well Patient left: in chair;with call bell/phone within reach  GO   05/22/2012 Cipriano Mile OTR/L Pager [phone] Office [phone]   Cipriano Mile 05/22/2012, 4:47 PM

## 2012-05-22 NOTE — Progress Notes (Signed)
Physical Therapy Treatment Patient Details Name: [name] MRN: [id number] DOB: Jul 14, 1933 Today's Date: 05/22/2012 Time: 4098-1191 PT Time Calculation (min): 24 min  PT Assessment / Plan / Recommendation Comments on Treatment Session  Pt making steady progress toward her PT goals today with incr activity performed. Requires cues for saftey with most mobilitly and cues to redirect pt to task being performed (pt easily distractable-?baseline behavior). Pt's son and friend present today and education on safe transfer and gait technique and how to best assist pt at home.  Further caregiver education warrented with caregiver's/family participating in sessin next time. Possibly have family provide the assist to the pt with supervision/assist/cues from treating therapist to ensure safety with home discharge.             Follow Up Recommendations  Home health PT;Supervision/Assistance - 24 hour           Equipment Recommendations  None recommended by PT       Frequency 7X/week   Plan Discharge plan remains appropriate;Frequency remains appropriate    Precautions / Restrictions Precautions Precautions: Posterior Hip;Fall Precaution Comments: Pt able to recall 2/3 hip precautions with incr time and no cues. Reeducated pt and family/friend on hip precautions and referred to handout issued yesterday for reminders. Restrictions LLE Weight Bearing: Weight bearing as tolerated       Mobility  Transfers Sit to Stand: With upper extremity assist;With armrests;From chair/3-in-1;3: Mod assist Stand to Sit: 4: Min assist;With upper extremity assist;With armrests;To chair/3-in-1 Details for Transfer Assistance: mod cues for hand placment and LLE placement with transfers. pt with significant posterior lean upon intial standing that required mod assist to correct and shift pt's wt fwd over her center. assist needed to control descent with sitting down and for hand/LLE  placement. Ambulation/Gait Ambulation/Gait Assistance: 4: Min assist Ambulation Distance (Feet): 30 Feet Assistive device: Rolling walker Ambulation/Gait Assistance Details: mod assist initiall with decr to min as gait progressed. Pt intially needed assist to shift wt forward with gait due to posterior lean. Pt able to self correct lean as gait progressed until stopping to sit down and then post lean returned. cues for sequence with gait, walker postion with gait and posture.              Gait Pattern: Step-to pattern;Decreased stance time - left;Decreased stride length;Left flexed knee in stance;Right flexed knee in stance;Antalgic;Trunk flexed;Decreased step length - right Gait velocity: Decreased    Exercises Total Joint Exercises Ankle Circles/Pumps: AROM;Both;10 reps;Supine Quad Sets: AROM;Strengthening;Left;10 reps;Supine Heel Slides: AROM;Strengthening;Left;10 reps;Supine Hip ABduction/ADduction: AROM;Strengthening;Left;10 reps;Supine     PT Goals Acute Rehab PT Goals PT Goal: Sit to Stand - Progress: Progressing toward goal PT Goal: Stand to Sit - Progress: Progressing toward goal PT Goal: Ambulate - Progress: Progressing toward goal PT Goal: Perform Home Exercise Program - Progress: Progressing toward goal  Visit Information  Last PT Received On: 05/22/12 Assistance Needed: +1    Subjective Data  Subjective: No new complaints, agreeable to therapy at this time. Son and "friend" present for session as well.   Cognition  Overall Cognitive Status: Appears within functional limits for tasks assessed/performed Arousal/Alertness: Awake/alert Orientation Level: Appears intact for tasks assessed Behavior During Session: [facility] for tasks performed       End of Session PT - End of Session Equipment Utilized During Treatment: Gait belt Activity Tolerance: Patient tolerated treatment well Patient left: in chair;with call bell/phone within reach;with family/visitor present Nurse  Communication: Mobility status   GP  Sallyanne Kuster 05/22/2012, 2:30 PM  Sallyanne Kuster, PTA Office- [phone]

## 2012-05-23 ENCOUNTER — Encounter (HOSPITAL_COMMUNITY): Payer: Self-pay | Admitting: Orthopaedic Surgery

## 2012-05-23 LAB — CBC
HCT: 21.5 % — ABNORMAL LOW (ref 36.0–46.0)
MCHC: 35.3 g/dL (ref 30.0–36.0)
Platelets: 188 10*3/uL (ref 150–400)
RDW: 13.1 % (ref 11.5–15.5)

## 2012-05-23 LAB — GLUCOSE, CAPILLARY: Glucose-Capillary: 100 mg/dL — ABNORMAL HIGH (ref 70–99)

## 2012-05-23 LAB — PROTIME-INR
INR: 1.2 (ref 0.00–1.49)
Prothrombin Time: 15 seconds (ref 11.6–15.2)

## 2012-05-23 MED ORDER — HYDROCODONE-ACETAMINOPHEN 5-500 MG PO TABS: 1.0000 | ORAL_TABLET | Freq: Four times a day (QID) | ORAL | Status: DC | PRN

## 2012-05-23 MED ORDER — WARFARIN SODIUM 7.5 MG PO TABS
7.5000 mg | ORAL_TABLET | Freq: Once | ORAL | Status: AC
  Administered 2012-05-23: 7.5 mg via ORAL
  Filled 2012-05-23: qty 1

## 2012-05-23 NOTE — Progress Notes (Signed)
Occupational Therapy Treatment Patient Details Name: [name] MRN: [id number] DOB: 04-15-1934 Today's Date: 05/23/2012 Time: 0865-7846 OT Time Calculation (min): 21 min  OT Assessment / Plan / Recommendation Comments on Treatment Session Pt making progress and should continue with OT services to maximize level of function and safety to return home    Follow Up Recommendations  Home health OT;Supervision/Assistance - 24 hour    Barriers to Discharge       Equipment Recommendations  Tub/shower bench    Recommendations for Other Services    Frequency Min 2X/week   Plan Discharge plan remains appropriate    Precautions / Restrictions Precautions Precautions: Posterior Hip Precaution Comments: pt able to recall 2/3 hip precautions. Reviewed all precautions with pt from PT handout Required Braces or Orthoses: Knee Immobilizer - Left Restrictions Weight Bearing Restrictions: Yes LLE Weight Bearing: Weight bearing as tolerated   Pertinent Vitals/Pain     ADL  Eating/Feeding: Performed;Set up Where Assessed - Eating/Feeding: Chair Grooming: Performed;Wash/dry hands;Wash/dry face;Min guard Where Assessed - Grooming: Supported standing Lower Body Bathing: Simulated;Minimal assistance Where Assessed - Lower Body Bathing: Unsupported sitting;Supported sit to stand Lower Body Dressing: Moderate assistance;Performed;Minimal assistance Where Assessed - Lower Body Dressing: Unsupported sitting;Supported sit to stand Toilet Transfer: Performed;Minimal assistance Toilet Transfer Method: Other (comment) (ambulating from RW level) Toilet Transfer Equipment: Raised toilet seat with arms (or 3-in-1 over toilet);Grab bars Toileting - Clothing Manipulation and Hygiene: Performed;Min guard Where Assessed - Engineer, mining and Hygiene: Standing Equipment Used: Rolling walker;Gait belt Transfers/Ambulation Related to ADLs: cues for descent to 3 in 1 and chair    OT Diagnosis:     OT Problem List:   OT Treatment Interventions:     OT Goals ADL Goals ADL Goal: Grooming - Progress: Progressing toward goals ADL Goal: Lower Body Bathing - Progress: Progressing toward goals ADL Goal: Lower Body Dressing - Progress: Progressing toward goals ADL Goal: Toilet Transfer - Progress: Progressing toward goals ADL Goal: Toileting - Clothing Manipulation - Progress: Progressing toward goals Miscellaneous OT Goals OT Goal: Miscellaneous Goal #1 - Progress: Other (comment) (able to verbalize 2/3 precautions)  Visit Information  Last OT Received On: 05/23/12 Assistance Needed: +1    Subjective Data  Subjective: " I will have help at home form my friend of 40 years ' Patient Stated Goal: To return home   Prior Functioning       Cognition  Overall Cognitive Status: Appears within functional limits for tasks assessed/performed Arousal/Alertness: Awake/alert Orientation Level: Appears intact for tasks assessed Behavior During Session: [facility] for tasks performed    Mobility  Shoulder Instructions Bed Mobility Bed Mobility: Not assessed Transfers Transfers: Sit to Stand;Stand to Sit Sit to Stand: 4: Min assist;With armrests;From chair/3-in-1 Stand to Sit: 4: Min assist;With armrests;To chair/3-in-1 Details for Transfer Assistance: Cues for increasing upright stance as pt demos posterior lean       Exercises  Total Joint Exercises Ankle Circles/Pumps: AROM;10 reps;Both Quad Sets: AROM;Both;10 reps Gluteal Sets: AROM;Both;10 reps   Balance Balance Balance Assessed: No   End of Session OT - End of Session Equipment Utilized During Treatment: Gait belt;Left knee immobilizer;Other (comment) (RW, 3 in 1) Activity Tolerance: Patient tolerated treatment well Patient left: in chair;with call bell/phone within reach  GO     Galen Manila 05/23/2012, 12:05 PM

## 2012-05-23 NOTE — Progress Notes (Signed)
Subjective: 3 Days Post-Op Procedure(s) (LRB): ARTHROPLASTY BIPOLAR HIP (Left) Patient reports pain as mild.  Essentially no pain, only stiffness when in bed too long.   Pt sitting in chair.  Denies symptoms of anemia and states she has been anemic several times in the past and has used Vitamin B12 and iron previously. Anxious to go home.  Objective: Vital signs in last 24 hours: Temp:  [97.7 F (36.5 C)] 97.7 F (36.5 C) (01/26 2250) Pulse Rate:  [86] 86  (01/26 2250) Resp:  [16] 16  (01/26 2250) BP: (123)/(45) 123/45 mmHg (01/26 2250) SpO2:  [94 %-96 %] 94 % (01/26 2250)  Intake/Output from previous day:   Intake/Output this shift:     Basename 05/23/12 0642 05/22/12 0645 05/21/12 0510  HGB 7.6* 8.6* 8.9*    Basename 05/23/12 0642 05/22/12 0645  WBC 7.0 8.4  RBC 2.59* 3.03*  HCT 21.5* 25.7*  PLT 188 178    Basename 05/21/12 0510  NA 131*  K 4.7  CL 97  CO2 28  BUN 9  CREATININE 0.66  GLUCOSE 104*  CALCIUM 8.5    Basename 05/23/12 0642 05/22/12 0645  LABPT -- --  INR 1.20 1.17    Neurovascular intact Intact pulses distally Dorsiflexion/Plantar flexion intact Incision: no drainage  Assessment/Plan: 3 Days Post-Op Procedure(s) (LRB): ARTHROPLASTY BIPOLAR HIP (Left) Up with therapy.  DC knee immobilizer.  Use bed pillow when in bed. WBAT left leg Pt stable for discharge from ortho standpoint when medically ready.  Has assist at home. RX for Vicodin on chart for pain Ice packs to thigh as needed. Dressing change as needed.  Keep wound covered.  May have wound wet for hygiene. OV 2 weeks. Coumadin for DVT prophylaxis x 2 weeks then switch to aspirin 325mg  daily for one month [name] 05/23/2012, 3:54 PM

## 2012-05-23 NOTE — Progress Notes (Signed)
Utilization review completed. Braidyn Peace, RN, BSN. 

## 2012-05-23 NOTE — Progress Notes (Signed)
[facility] PROGRESS NOTE  [name] JYN:[id number] DOB: 01-08-1934 DOA: 05/19/2012 PCP: Vennie Homans, MD  Assessment/Plan: 1. Left hip fracture status post mechanical fall: Status post surgery. On warfarin for DVT prophylaxis for 2 weeks, then 325 mg aspirin daily for one month per orthopedics. Weight-bear as tolerated. 2. Mild hyponatremia: Asymptomatic. Likely secondary to Celexa. No further evaluation suggested. 3. Postoperative anemia superimposed on chronic anemia: Lower today. Recheck in the morning. If stable, discharge.   4. Diabetes mellitus type 2: Stable. Continue sliding scale insulin. Resume metformin on discharge. 5. Left bundle branch block: Chronicity unclear, no further evaluation suggested.  Code Status: Full code Family Communication: None present Disposition Plan: Home with home health 1/28.  [name], MD  [facility] Team 5 Pager [phone] If 7PM-7AM, please contact night-coverage at [email or web address], password [facility] 05/23/2012, 6:41 PM  LOS: 4 days   Brief narrative: 79 year old female who presents to the emergency room at [facility] after a fall. The patient states that she fell after she tripped over couple of steps while saying goodbye to her son. The patient was not paying attention while she was walking lost her balance and fell. She denied any head injury loss of consciousness neck injury back injury chest pain shortness of breath abdominal pain nausea vomiting. She denies any cardiac history and denies any history of coronary artery disease although she is a diabetic. Her EKG shows left bundle-branch block but she is unaware often denies ever having an MI. She was transferred to [facility] for surgery by Dr. Ophelia Charter for her hip fracture  Consultants:  Orthopedics   Physical therapy: Home health PT  Occupational therapy: Home health, tub/shower bench  Procedures:  Left hip hemiarthroplasty, posterior  approach  HPI/Subjective: Overall doing well.   Objective: Filed Vitals:   05/22/12 1329 05/22/12 1600 05/22/12 2250 05/23/12 1558  BP: 162/52  123/45   Pulse: 93  86   Temp: 98 F (36.7 C)  97.7 F (36.5 C)   TempSrc:   Axillary   Resp: 18 16 16    Height:    5' 5.5" (1.664 m)  Weight:    58.968 kg (130 lb)  SpO2: 95% 96% 94%    No intake or output data in the 24 hours ending 05/23/12 1841 Filed Weights   05/20/12 0447 05/21/12 0500 05/23/12 1558  Weight: 57.335 kg (126 lb 6.4 oz) 58.968 kg (130 lb) 58.968 kg (130 lb)    Exam:  General:  Appears calm and comfortable Cardiovascular: RRR, no m/r/g.  Respiratory: CTA bilaterally, no w/r/r. Normal respiratory effort. Psychiatric: grossly normal mood and affect, speech fluent and appropriate  Exam current 1/27  Data Reviewed: Basic Metabolic Panel:  Lab 05/21/12 9629 05/20/12 1520 05/20/12 0241 05/19/12 1512  NA 131* -- 129* 131*  K 4.7 -- 3.7 3.9  CL 97 -- 93* 93*  CO2 28 -- 26 25  GLUCOSE 104* -- 100* 110*  BUN 9 -- 10 9  CREATININE 0.66 0.57 0.60 0.50  CALCIUM 8.5 -- 8.9 9.5  MG -- -- -- --  PHOS -- -- -- --   Liver Function Tests:  Lab 05/20/12 0241 05/19/12 1512  AST 27 31  ALT 15 18  ALKPHOS 48 57  BILITOT 0.6 0.8  PROT 7.6 8.5*  ALBUMIN 3.1* 3.8   CBC:  Lab 05/23/12 0642 05/22/12 0645 05/21/12 0510 05/20/12 1520 05/20/12 0241  WBC 7.0 8.4 6.1 8.0 5.8  NEUTROABS -- -- -- -- --  HGB 7.6* 8.6*  8.9* 9.9* 9.1*  HCT 21.5* 25.7* 26.4* 29.2* 26.5*  MCV 83.0 84.8 85.4 84.9 82.8  PLT 188 178 162 159 179   Cardiac Enzymes:  Lab 05/20/12 0241 05/19/12 2018 05/19/12 1512  CKTOTAL -- -- --  CKMB -- -- --  CKMBINDEX -- -- --  TROPONINI <0.30 <0.30 <0.30   CBG:  Lab 05/23/12 1125 05/23/12 0628 05/22/12 2253 05/22/12 1703 05/22/12 1117  GLUCAP 126* 100* 154* 190* 154*    Studies: No results found.  Scheduled Meds:    . amLODipine  5 mg Oral Daily  . citalopram  10 mg Oral Daily  . docusate  sodium  100 mg Oral BID  . enoxaparin (LOVENOX) injection  40 mg Subcutaneous Q24H  . insulin aspart  0-9 Units Subcutaneous TID WC  . lisinopril  20 mg Oral Daily  . loxapine  20 mg Oral QHS  . potassium chloride SA  20 mEq Oral Daily  . sodium chloride  3 mL Intravenous Q12H  . Warfarin - Pharmacist Dosing Inpatient   Does not apply q1800   Continuous Infusions:   Principal Problem:  *Hip fracture Active Problems:  Hypertension  Type 2 diabetes mellitus  Hyponatremia  Anemia  Left bundle branch block     [name], MD  [facility] Team 5 Pager [phone] If 7PM-7AM, please contact night-coverage at [email or web address], password [facility] 05/23/2012, 6:41 PM  LOS: 4 days   Time spent: 10 minutes

## 2012-05-23 NOTE — Progress Notes (Signed)
Physical Therapy Treatment Patient Details Name: [name] MRN: [id number] DOB: 1933/06/10 Today's Date: 05/23/2012 Time: 6295-2841 PT Time Calculation (min): 38 min  PT Assessment / Plan / Recommendation Comments on Treatment Session  Pt continuing to progress with increased gait distance.  Will require assistance to maintain balance and safety maintaining precautions.    Follow Up Recommendations  Home health PT;Supervision/Assistance - 24 hour     Does the patient have the potential to tolerate intense rehabilitation     Barriers to Discharge        Equipment Recommendations  None recommended by PT    Recommendations for Other Services    Frequency 7X/week   Plan Discharge plan remains appropriate;Frequency remains appropriate    Precautions / Restrictions Precautions Precautions: Posterior Hip Precaution Comments: Pt able to recall 2/3 precautions. Reviewed all precautions and provided handout. Required Braces or Orthoses: Knee Immobilizer - Left Restrictions LLE Weight Bearing: Weight bearing as tolerated   Pertinent Vitals/Pain no apparent distress     Mobility   Transfers Sit to Stand: 3: Mod assist Stand to Sit: 4: Min assist Details for Transfer Assistance: Assist to initiate stand and maintain balance as pt presents with posterior lean upon standing. Cueing to reach back and extend LLE to maintain precautions for stand to sit. Ambulation/Gait Ambulation/Gait Assistance: 4: Min assist Ambulation Distance (Feet): 100 Feet Assistive device: Rolling walker Ambulation/Gait Assistance Details: Assistance to maintain balance and cueing to shift weight forward and maintain upright posture. Decreased speed as pt has to stop after every few steps and focus up upright posture in order to self correct.  Gait Pattern: Step-to pattern;Decreased stride length;Trunk flexed;Right flexed knee in stance;Left flexed knee in stance Gait velocity: decreased    Exercises Total  Joint Exercises Ankle Circles/Pumps: AROM;10 reps;Both Quad Sets: AROM;Both;10 reps Gluteal Sets: AROM;Both;10 reps   PT Diagnosis:    PT Problem List:   PT Treatment Interventions:     PT Goals Acute Rehab PT Goals PT Goal: Sit to Stand - Progress: Progressing toward goal PT Goal: Stand to Sit - Progress: Progressing toward goal PT Goal: Ambulate - Progress: Progressing toward goal  Visit Information  Last PT Received On: 05/23/12 Assistance Needed: +1    Subjective Data      Cognition  Overall Cognitive Status: Appears within functional limits for tasks assessed/performed Arousal/Alertness: Awake/alert Orientation Level: Appears intact for tasks assessed Behavior During Session: [facility] for tasks performed    Balance     End of Session PT - End of Session Equipment Utilized During Treatment: Gait belt;Left knee immobilizer Activity Tolerance: Patient tolerated treatment well Patient left: in chair;with call bell/phone within reach   GP     Lazaro Arms 05/23/2012, 9:59 AM

## 2012-05-23 NOTE — Progress Notes (Signed)
CARE MANAGEMENT NOTE 05/23/2012  Patient:  [name], [name]   Account Number:  [id number]  Date Initiated:  05/23/2012  Documentation initiated by:  Vance Peper  Subjective/Objective Assessment:   79 yr old female s/pLeft hip hemiarthroplasty.     Action/Plan:   CM spoke with patient concerning Home Health and DME needs. Choice offered.  Contacted [facility] which was patients choice, they cant service her.Contacted [facility] in [place] and they will provide HHRN&PT.  Faxed orders.   Anticipated DC Date:  05/24/2012   Anticipated DC Plan:  HOME W HOME HEALTH SERVICES      DC Planning Services  CM consult      [facility] Choice  HOME HEALTH  DURABLE MEDICAL EQUIPMENT   Choice offered to / List presented to:  C-1 Patient   DME arranged  Levan Hurst      DME agency  [facility]     [facility] arranged  HH-1 RN  HH-2 PT      [facility] agency  [facility]   Status of service:  Completed, signed off Medicare Important Message given?   (If response is "NO", the following Medicare IM given date fields will be blank) Date Medicare IM given:   Date Additional Medicare IM given:    Discharge Disposition:  HOME W HOME HEALTH SERVICES  Per UR Regulation:    If discussed at Long Length of Stay Meetings, dates discussed:    Comments:  05/23/12 1330 Vance Peper, RN BSN Faxed patient's orders for home Health to [facility] in [place], [place] [phone] phone [phone]

## 2012-05-23 NOTE — Progress Notes (Signed)
ANTICOAGULATION CONSULT NOTE - Follow Up Consult  Pharmacy Consult for coumadin Indication: VTE prophylaxis  No Known Allergies  Patient Measurements: Weight: 130 lb (58.968 kg) Heparin Dosing Weight:   Vital Signs:    Labs:  Basename 05/23/12 0642 05/22/12 0645 05/21/12 0510 05/20/12 1520  HGB 7.6* 8.6* -- --  HCT 21.5* 25.7* 26.4* --  PLT 188 178 162 --  APTT -- -- -- --  LABPROT 15.0 14.7 13.4 --  INR 1.20 1.17 1.03 --  HEPARINUNFRC -- -- -- --  CREATININE -- -- 0.66 0.57  CKTOTAL -- -- -- --  CKMB -- -- -- --  TROPONINI -- -- -- --    CrCl is unknown because there is no height on file for the current visit.  Assessment: 32 yof continues on coumadin for VTE prophylaxis. INR today remains subtherapeutic at 1.2. She is also on lovenox until INR is at goal. No bleeding noted but H/H is trending down. Will need to watch closely.   Goal of Therapy:  INR 2-3 Monitor platelets by anticoagulation protocol: Yes   Plan:  1. Warfarin 7.5mg  PO x  1 tonight 2. F/u AM INR 3. Monitor CBC closely  Mikel Pyon, Drake Leach 05/23/2012,12:54 PM

## 2012-05-24 DIAGNOSIS — D62 Acute posthemorrhagic anemia: Secondary | ICD-10-CM

## 2012-05-24 LAB — CBC
HCT: 20.4 % — ABNORMAL LOW (ref 36.0–46.0)
MCH: 28.2 pg (ref 26.0–34.0)
MCV: 82.3 fL (ref 78.0–100.0)
Platelets: 223 10*3/uL (ref 150–400)
RBC: 2.48 MIL/uL — ABNORMAL LOW (ref 3.87–5.11)
WBC: 6 10*3/uL (ref 4.0–10.5)

## 2012-05-24 LAB — GLUCOSE, CAPILLARY
Glucose-Capillary: 117 mg/dL — ABNORMAL HIGH (ref 70–99)
Glucose-Capillary: 166 mg/dL — ABNORMAL HIGH (ref 70–99)
Glucose-Capillary: 148 mg/dL — ABNORMAL HIGH (ref 70–99)
Glucose-Capillary: 117 mg/dL — ABNORMAL HIGH (ref 70–99)

## 2012-05-24 LAB — TYPE AND SCREEN: Unit division: 0

## 2012-05-24 LAB — PREPARE RBC (CROSSMATCH)

## 2012-05-24 MED ORDER — ACETAMINOPHEN 325 MG PO TABS
650.0000 mg | ORAL_TABLET | Freq: Once | ORAL | Status: AC
  Administered 2012-05-24: 650 mg via ORAL
  Filled 2012-05-24: qty 2

## 2012-05-24 MED ORDER — DIPHENHYDRAMINE HCL 25 MG PO CAPS
25.0000 mg | ORAL_CAPSULE | Freq: Once | ORAL | Status: AC
  Administered 2012-05-24: 25 mg via ORAL
  Filled 2012-05-24: qty 1

## 2012-05-24 MED ORDER — WARFARIN SODIUM 10 MG PO TABS
10.0000 mg | ORAL_TABLET | Freq: Once | ORAL | Status: AC
  Administered 2012-05-24: 10 mg via ORAL
  Filled 2012-05-24: qty 1

## 2012-05-24 NOTE — Progress Notes (Signed)
ANTICOAGULATION CONSULT NOTE - Follow Up Consult  Pharmacy Consult for Coumadin Indication: VTE prophylaxis following Hip Fracture repair   Vital Signs: BP 111/44  Pulse 60  Temp 97.8 F (36.6 C) (Axillary)  Resp 16  Ht 5' 5.5" (1.664 m)  Wt 130 lb (58.968 kg)  BMI 21.30 kg/m2  SpO2 93%  Labs:  Basename 05/24/12 0600 05/23/12 0642 05/22/12 0645  HGB 7.0* 7.6* --  HCT 20.4* 21.5* 25.7*  PLT 223 188 178  APTT -- -- --  LABPROT 15.0 15.0 14.7  INR 1.20 1.20 1.17   Lab Results  Component Value Date   INR 1.20 05/24/2012   INR 1.20 05/23/2012   INR 1.17 05/22/2012    Estimated Creatinine Clearance: 55 ml/min (by C-G formula based on Cr of 0.66).  Medications:  Scheduled:    . enoxaparin (LOVENOX) injection  40 mg Subcutaneous Q24H  . [COMPLETED] warfarin  7.5 mg Oral ONCE-1800  . Warfarin - Pharmacist Dosing Inpatient   Does not apply q1800   Assessment:  Day # 5 Coumadin therapy with minimal INR response.  ABLA super imposed on chronic anemia.  Patient to receive 1 unit of PRBC's.  Goal of Therapy:  Target INR 2-3   Lovenox bridging until INR therapeutic   Plan:  Coumadin 10 mg today   Continue Lovenox 40 mg daily  Ravleen Ries J  Pharm.D 05/24/2012, 10:55 AM

## 2012-05-24 NOTE — Progress Notes (Signed)
Occupational Therapy Treatment Patient Details Name: [name] MRN: [id number] DOB: December 09, 1933 Today's Date: 05/24/2012 Time: 7829-5621 OT Time Calculation (min): 17 min  OT Assessment / Plan / Recommendation Comments on Treatment Session Pt demos decline in function since last tx session. Pt has Hgb of 7.0 and nrsg reports that she will receive blood today. Pt should continue with OT services to maximize level of function and safety t return home    Follow Up Recommendations  Home health OT;Supervision/Assistance - 24 hour    Barriers to Discharge       Equipment Recommendations  Tub/shower bench    Recommendations for Other Services    Frequency Min 2X/week   Plan Discharge plan remains appropriate    Precautions / Restrictions Precautions Precautions: Posterior Hip Precaution Comments: pt only able to verbalize 2/3 hip precautions, reviewed all hip ecautions with pt Required Braces or Orthoses: Knee Immobilizer - Left Restrictions Weight Bearing Restrictions: Yes LLE Weight Bearing: Weight bearing as tolerated   Pertinent Vitals/Pain     ADL  Transfers/Ambulation Related to ADLs: pt required mod A for scooting to edge of seat in recliner and mod A for sit - stand to RW. Pt demos decline since last tx ADL Comments: pt and her dtr provided with education and demo of tub bench for home use, pt unable to attempt today    OT Diagnosis:    OT Problem List:   OT Treatment Interventions:     OT Goals ADL Goals ADL Goal: Grooming - Progress: Other (comment) (decreased Hgb limiting ability today) ADL Goal: Lower Body Bathing - Progress: Other (comment) (decreased Hgb limiting ability today) ADL Goal: Lower Body Dressing - Progress: Other (comment) (decreased Hgb limiting ability today) ADL Goal: Toilet Transfer - Progress: Other (comment) (decreased hgb limiting ability today) ADL Goal: Toileting - Clothing Manipulation - Progress: Other (comment) (decreased Hgb limiting  ability today) ADL Goal: Tub/Shower Transfer - Progress: Other (comment) (decreased Hgb limiting ability today) Miscellaneous OT Goals OT Goal: Miscellaneous Goal #1 - Progress: Other (comment) (required review of precautions, verbalized 2/3)  Visit Information  Last OT Received On: 05/24/12 Assistance Needed: +1    Subjective Data  Subjective: " I have just gotten so weak " Patient Stated Goal: To return home   Prior Functioning       Cognition  Overall Cognitive Status: Appears within functional limits for tasks assessed/performed Arousal/Alertness: Awake/alert Orientation Level: Appears intact for tasks assessed Behavior During Session: [facility] for tasks performed    Mobility  Shoulder Instructions Bed Mobility Bed Mobility: Not assessed Transfers Transfers: Sit to Stand;Stand to Sit Sit to Stand: 3: Mod assist;With armrests;With upper extremity assist Stand to Sit: 3: Mod assist;With armrests;With upper extremity assist Details for Transfer Assistance: pt required increased A due to Hgb 7.0 and weakness. max verbal cues required for sit - stand and pt unable to participate in tub bench or toilet transfer          Balance Balance Balance Assessed: No   End of Session OT - End of Session Equipment Utilized During Treatment: Gait belt;Other (comment) (RW, tub bench) Activity Tolerance: Patient limited by fatigue;Other (comment) (weakness, decreased Hgb) Patient left: in chair;with call bell/phone within reach;with family/visitor present;with nursing in room  GO     Galen Manila 05/24/2012, 12:53 PM

## 2012-05-24 NOTE — Progress Notes (Signed)
Physical Therapy Treatment Patient Details Name: Lisa-Marie Rueger MRN: [id number] DOB: [birth year] Today's Date: 05/24/2012 Time: 3086-5784 PT Time Calculation (min): 31 min  PT Assessment / Plan / Recommendation Comments on Treatment Session  Limited session due to Hgb 7.0. Pt receiving blood this afternoon. Will reassess tomorrow morning.    Follow Up Recommendations  Home health PT;Supervision/Assistance - 24 hour     Does the patient have the potential to tolerate intense rehabilitation     Barriers to Discharge        Equipment Recommendations  None recommended by PT    Recommendations for Other Services    Frequency 7X/week   Plan Discharge plan remains appropriate;Frequency remains appropriate    Precautions / Restrictions Precautions Precautions: Posterior Hip Precaution Comments: Reviewed all precautions with pt and family Required Braces or Orthoses: Knee Immobilizer - Left Restrictions LLE Weight Bearing: Weight bearing as tolerated       Mobility  Transfers Transfers: Stand Pivot Transfers Sit to Stand: 3: Mod assist;From chair/3-in-1 Stand to Sit: 3: Mod assist;To chair/3-in-1 Stand Pivot Transfers: 3: Mod assist (+1 for safety) Details for Transfer Assistance: Pt requiring modA this session secondary to 7.0 Hgb and weakness. Max verbal cueing  and guidance of pelvis for stand pivot transfer. Ambulation/Gait Ambulation/Gait Assistance: Not tested (comment)    Exercises Total Joint Exercises Ankle Circles/Pumps: AROM;Both;10 reps Quad Sets: AROM;Both;10 reps Gluteal Sets: AROM;Both;10 reps Short Arc QuadBarbaraann Boys;Left;10 reps Heel Slides: AAROM;Left;10 reps Hip ABduction/ADduction: AAROM;Left;10 reps Long Arc Quad: AAROM;Left;10 reps   PT Diagnosis:    PT Problem List:   PT Treatment Interventions:     PT Goals Acute Rehab PT Goals PT Goal: Sit to Stand - Progress: Not progressing PT Goal: Stand to Sit - Progress: Not progressing PT Goal: Perform Home  Exercise Program - Progress: Progressing toward goal  Visit Information  Last PT Received On: 05/24/12 Assistance Needed: +1    Subjective Data      Cognition  Overall Cognitive Status: Appears within functional limits for tasks assessed/performed Arousal/Alertness: Awake/alert Orientation Level: Appears intact for tasks assessed Behavior During Session: [facility] for tasks performed    Balance     End of Session PT - End of Session Activity Tolerance: Treatment limited secondary to medical complications (Comment) (Hgb 7.0) Patient left: in chair;with call bell/phone within reach;with family/visitor present   GP     Lazaro Arms 05/24/2012, 11:21 AM

## 2012-05-24 NOTE — Progress Notes (Signed)
Seen and agreed 05/24/2012 Mckynna Vanloan Elizabeth PTA [phone] pager [phone] office    

## 2012-05-24 NOTE — Progress Notes (Signed)
[facility] PROGRESS NOTE  [name] WUJ:[id number] DOB: [birth year] DOA: 05/19/2012 PCP: Vennie Homans, MD  Assessment/Plan: 1. Left hip fracture status post mechanical fall: Status post surgery. On warfarin for DVT prophylaxis for 2 weeks, then 325 mg aspirin daily for one month per orthopedics. Weight-bear as tolerated. 2. Postoperative anemia superimposed on chronic anemia: Still lower. INR quite low, gout, and contributing. Likely postoperative in nature, superimposed on chronic anemia by history. Suspect initial hemoglobin of 10.6 was artificially elevated. Transfuse 1 unit packed red blood cells and assess response. Monitor left thigh. 3. Mild hyponatremia: Asymptomatic. Likely secondary to Celexa. No further evaluation suggested. 4. Diabetes mellitus type 2: Stable. Continue sliding scale insulin. Resume metformin on discharge. 5. Left bundle branch block: Chronicity unclear, no further evaluation suggested.  Code Status: Full code Family Communication: Discussed with daughter at bedside Disposition Plan: Home with home health   [name], MD  [facility] Team 5 Pager [phone] If 7PM-7AM, please contact night-coverage at [email or web address], password [facility] 05/24/2012, 8:17 AM  LOS: 5 days   Brief narrative: [name] who presents to the emergency room at [facility] after a fall. The patient states that she fell after she tripped over couple of steps while saying goodbye to her son. The patient was not paying attention while she was walking lost her balance and fell. She denied any head injury loss of consciousness neck injury back injury chest pain shortness of breath abdominal pain nausea vomiting. She denies any cardiac history and denies any history of coronary artery disease although she is a diabetic. Her EKG shows left bundle-branch block but she is unaware often denies ever having an MI. She was transferred to [facility] for surgery by Dr.  Ophelia Charter for her hip fracture  Consultants:  Orthopedics   Physical therapy: Home health PT  Occupational therapy: Home health, tub/shower bench  Procedures:  Left hip hemiarthroplasty, posterior approach  HPI/Subjective: No new issues.  Objective: Filed Vitals:   05/22/12 2250 05/23/12 1558 05/23/12 2109 05/24/12 0527  BP: 123/45  117/33 111/44  Pulse: 86  94 60  Temp: 97.7 F (36.5 C)  98.3 F (36.8 C) 97.8 F (36.6 C)  TempSrc: Axillary  Oral Axillary  Resp: 16  18 16   Height:  5' 5.5" (1.664 m)    Weight:  58.968 kg (130 lb)    SpO2: 94%  93% 93%    Intake/Output Summary (Last 24 hours) at 05/24/12 0817 Last data filed at 05/24/12 0640  Gross per 24 hour  Intake    360 ml  Output      0 ml  Net    360 ml   Filed Weights   05/20/12 0447 05/21/12 0500 05/23/12 1558  Weight: 57.335 kg (126 lb 6.4 oz) 58.968 kg (130 lb) 58.968 kg (130 lb)    Exam:  General:  Appears calm and comfortable Cardiovascular: RRR, no m/r/g.  Respiratory: CTA bilaterally, no w/r/r. Normal respiratory effort. Musculoskeletal: Left thigh edematous, incision covered, circumference or greater than right eye.   Data Reviewed: Basic Metabolic Panel:  Lab 05/21/12 8657 05/20/12 1520 05/20/12 0241 05/19/12 1512  NA 131* -- 129* 131*  K 4.7 -- 3.7 3.9  CL 97 -- 93* 93*  CO2 28 -- 26 25  GLUCOSE 104* -- 100* 110*  BUN 9 -- 10 9  CREATININE 0.66 0.57 0.60 0.50  CALCIUM 8.5 -- 8.9 9.5  MG -- -- -- --  PHOS -- -- -- --   Liver  Function Tests:  Lab 05/20/12 0241 05/19/12 1512  AST 27 31  ALT 15 18  ALKPHOS 48 57  BILITOT 0.6 0.8  PROT 7.6 8.5*  ALBUMIN 3.1* 3.8   CBC:  Lab 05/24/12 0600 05/23/12 0642 05/22/12 0645 05/21/12 0510 05/20/12 1520  WBC 6.0 7.0 8.4 6.1 8.0  NEUTROABS -- -- -- -- --  HGB 7.0* 7.6* 8.6* 8.9* 9.9*  HCT 20.4* 21.5* 25.7* 26.4* 29.2*  MCV 82.3 83.0 84.8 85.4 84.9  PLT 223 188 178 162 159   Cardiac Enzymes:  Lab 05/20/12 0241 05/19/12 2018 05/19/12  1512  CKTOTAL -- -- --  CKMB -- -- --  CKMBINDEX -- -- --  TROPONINI <0.30 <0.30 <0.30   CBG:  Lab 05/24/12 0658 05/24/12 0109 05/23/12 1604 05/23/12 1125 05/23/12 0628  GLUCAP 122* 124* 117* 126* 100*    Studies: No results found.  Scheduled Meds:    . amLODipine  5 mg Oral Daily  . citalopram  10 mg Oral Daily  . docusate sodium  100 mg Oral BID  . enoxaparin (LOVENOX) injection  40 mg Subcutaneous Q24H  . insulin aspart  0-9 Units Subcutaneous TID WC  . lisinopril  20 mg Oral Daily  . loxapine  20 mg Oral QHS  . potassium chloride SA  20 mEq Oral Daily  . sodium chloride  3 mL Intravenous Q12H  . Warfarin - Pharmacist Dosing Inpatient   Does not apply q1800   Continuous Infusions:   Principal Problem:  *Hip fracture Active Problems:  Hypertension  Type 2 diabetes mellitus  Hyponatremia  Anemia  Left bundle branch block  Acute blood loss anemia     [name], MD  [facility] Team 5 Pager [phone] If 7PM-7AM, please contact night-coverage at [email or web address], password [facility] 05/24/2012, 8:17 AM  LOS: 5 days   Time spent: 15 minutes

## 2012-05-25 DIAGNOSIS — I10 Essential (primary) hypertension: Secondary | ICD-10-CM

## 2012-05-25 LAB — TYPE AND SCREEN
ABO/RH(D): B NEG
Antibody Screen: NEGATIVE

## 2012-05-25 LAB — CBC
MCH: 28.2 pg (ref 26.0–34.0)
MCHC: 33.8 g/dL (ref 30.0–36.0)
MCV: 83.3 fL (ref 78.0–100.0)
Platelets: 295 10*3/uL (ref 150–400)
RBC: 3.12 MIL/uL — ABNORMAL LOW (ref 3.87–5.11)

## 2012-05-25 LAB — PROTIME-INR: Prothrombin Time: 16.5 seconds — ABNORMAL HIGH (ref 11.6–15.2)

## 2012-05-25 MED ORDER — WARFARIN SODIUM 10 MG PO TABS: 10.0000 mg | ORAL_TABLET | Freq: Every day | ORAL | Status: DC

## 2012-05-25 NOTE — Progress Notes (Signed)
Physical Therapy Treatment Patient Details Name: [name] MRN: [id number] DOB: 08-21-1933 Today's Date: 05/25/2012 Time: 6578-4696 PT Time Calculation (min): 30 min  PT Assessment / Plan / Recommendation Comments on Treatment Session  Pt presents with better mood and increased participation this session. Able to ambulate in hall MinA with minimal complaints of fatigue. Pt stated she is comfortable with D/C home with HHPT however pt will require 24hour assistance in which son and friend can provide .    Follow Up Recommendations  Home health PT;Supervision/Assistance - 24 hour     Does the patient have the potential to tolerate intense rehabilitation     Barriers to Discharge        Equipment Recommendations  None recommended by PT    Recommendations for Other Services    Frequency 7X/week   Plan Discharge plan remains appropriate;Frequency remains appropriate    Precautions / Restrictions Precautions Precautions: Posterior Hip Precaution Comments: Reviewed all precautions with pt and daughter Required Braces or Orthoses: Knee Immobilizer - Left Restrictions Weight Bearing Restrictions: Yes LLE Weight Bearing: Weight bearing as tolerated       Mobility  Transfers Sit to Stand: 4: Min assist;With upper extremity assist;From chair/3-in-1 Stand to Sit: 4: Min assist;With upper extremity assist;To chair/3-in-1 Details for Transfer Assistance: Required to attempts and motivation to push up from chair. VCs to extend LLE before sitting to maintain precautions. Ambulation/Gait Ambulation Distance (Feet): 100 Feet Assistive device: Rolling walker Ambulation/Gait Assistance Details: max cueing for upright posture, RW management, increasing step length. Pt attempts to correct posture but returns to flex posture with next step. Gait Pattern: Step-to pattern;Decreased stride length;Trunk flexed;Decreased step length - right;Decreased step length - left    Exercises Total Joint  Exercises Ankle Circles/Pumps: AROM;Both;10 reps Quad Sets: AROM;10 reps;Both Gluteal Sets: AROM;Both;10 reps Long Arc Quad: AAROM;Both;10 reps   PT Diagnosis:    PT Problem List:   PT Treatment Interventions:     PT Goals Acute Rehab PT Goals PT Goal: Sit to Stand - Progress: Progressing toward goal PT Goal: Stand to Sit - Progress: Progressing toward goal PT Goal: Ambulate - Progress: Progressing toward goal PT Goal: Perform Home Exercise Program - Progress: Progressing toward goal  Visit Information  Last PT Received On: 05/25/12 Assistance Needed: +1    Subjective Data      Cognition  Overall Cognitive Status: Appears within functional limits for tasks assessed/performed Arousal/Alertness: Awake/alert Orientation Level: Appears intact for tasks assessed Behavior During Session: [facility] for tasks performed    Balance     End of Session PT - End of Session Equipment Utilized During Treatment: Gait belt;Left knee immobilizer Activity Tolerance: Patient tolerated treatment well Patient left: in chair;with call bell/phone within reach;with family/visitor present   GP     Lazaro Arms 05/25/2012, 10:20 AM

## 2012-05-25 NOTE — Progress Notes (Signed)
Seen and agreed 04/04/2013 Robinette, Julia Elizabeth PTA [phone] pager [phone] office    

## 2012-05-25 NOTE — Progress Notes (Signed)
Patient discharged in stable condition via wheelchair. Discharge instructions and prescriptions were given and explained 

## 2012-05-25 NOTE — Discharge Summary (Signed)
Physician Discharge Summary  Patient ID: [name] MRN: [id number] DOB/AGE: 1933/07/29 79 y.o.  Admit date: 05/19/2012 Discharge date: 05/25/2012  Primary Care Physician:  Connie Homans, MD   Discharge Diagnoses:    Principal Problem:  *Hip fracture Active Problems:  Hypertension  Type 2 diabetes mellitus  Hyponatremia  Left bundle branch block  Acute blood loss anemia      Medication List     As of 05/25/2012 10:45 AM    STOP taking these medications         cephALEXin 500 MG capsule   Commonly known as: KEFLEX      potassium chloride SA 20 MEQ tablet   Commonly known as: K-DUR,KLOR-CON      TAKE these medications         ALPRAZolam 0.5 MG tablet   Commonly known as: XANAX   Take 0.5 mg by mouth at bedtime as needed.      amLODipine 5 MG tablet   Commonly known as: NORVASC   Take 5 mg by mouth daily.      citalopram 20 MG tablet   Commonly known as: CELEXA   Take 10 mg by mouth daily.      HYDROcodone-acetaminophen 5-500 MG per tablet   Commonly known as: VICODIN   Take 1 tablet by mouth every 6 (six) hours as needed for pain.      lisinopril 20 MG tablet   Commonly known as: PRINIVIL,ZESTRIL   Take 20 mg by mouth daily.      loxapine 10 MG capsule   Commonly known as: LOXITANE   Take 20 mg by mouth daily.      meloxicam 15 MG tablet   Commonly known as: MOBIC   Take 15 mg by mouth daily.      metFORMIN 500 MG 24 hr tablet   Commonly known as: GLUCOPHAGE-XR   Take 500 mg by mouth daily with breakfast.      warfarin 10 MG tablet   Commonly known as: COUMADIN   Take 1 tablet (10 mg total) by mouth daily.         Disposition and Follow-up:  Will be discharged home today in stable and improved condition. Have advised she follow up with her PCP in 2 days for INR check, as it is subtherapeutic on DC.  Consults:  Ortho, Dr. Ophelia Garcia   Significant Diagnostic Studies:  No results found.  Brief H and P: For complete details please refer  to admission H and P, but in brief 79 year old female who presents to the emergency room at [facility] after a fall. The patient states that she fell after she tripped over couple of steps while saying goodbye to her son. The patient was not paying attention while she was walking lost her balance and fell. She denied any head injury loss of consciousness neck injury back injury chest pain shortness of breath abdominal pain nausea vomiting. She denies any cardiac history and denies any history of coronary artery disease although she is a diabetic. Her EKG shows left bundle-branch block but she is unaware often denies ever having an MI . She was transferred to [facility] for surgery by Dr. Ophelia Garcia for her hip fracture        Hospital Course:  Principal Problem:  *Hip fracture Active Problems:  Hypertension  Type 2 diabetes mellitus  Hyponatremia  Left bundle branch block  Acute blood loss anemia    1. Left hip fracture status post mechanical fall: Status  post surgery. On warfarin for DVT prophylaxis for 2 weeks, then 325 mg aspirin daily for one month per orthopedics. Weight-bear as tolerated. INR is subtherapeutic on DC at 1.37. Will take warfarin 10 mg today and tomorrow. She will follow up with her PCP for coumadin check in 2 days for further dosing instructions. 2. Postoperative anemia superimposed on chronic anemia: Has ABLA superimposed on AOCD. Received 1 units of PRBCs yesterday with appropriate Hb response. Will need a CBC rechecked in about 1 week. 3. Mild hyponatremia: Asymptomatic. Likely secondary to Celexa. No further evaluation suggested. 4. Diabetes mellitus type 2: Stable. Continue sliding scale insulin. Resume metformin on discharge. 5. Left bundle branch block: Chronicity unclear, no further evaluation suggested.   Time spent on Discharge: Greater than 30 minutes.  SignedChaya Garcia [facility] Pager: [phone] 05/25/2012, 10:45  AM

## 2012-06-06 NOTE — H&P (Signed)
[name] is an 79 y.o. female.   Chief Complaint: left hip pain HPI: Pt is s/p left hip hemiarthroplasty on 05/20/12.  Post op radiographs showed good position of the prosthesis.  Pt was discharged to home and noticed increase pain with ambulation.  Radiographs at office visit 06/02/12 showed fracture of the left hip calcar and subsidence of the prosthesis.  Pt will require surgical intervention for revsion of left hip hemiarthroplasty. Pt was on coumadin for VTE prophylaxis and this was stopped on 06/03/12.  Pt started on Vitamin K 5mg  daily for 3 days in anticipation for procedure.  Past Medical History  Diagnosis Date  . Heart murmur   . Hypertension   . Depression   . Diabetes mellitus without complication     type 2  . Bell's palsy   . Arthritis   . Anemia     Past Surgical History  Procedure Laterality Date  . Appendectomy    . Tubal ligation    . Hip arthroplasty  05/20/2012    Procedure: ARTHROPLASTY BIPOLAR HIP;  Surgeon: Eldred Manges, MD;  Location: [facility] OR;  Service: Orthopedics;  Laterality: Left;  Left monopolar hemiarthroplasty    No family history on file. Social History:  reports that she quit smoking about 37 years ago. She has never used smokeless tobacco. She reports that she does not drink alcohol or use illicit drugs.  Allergies: No Known Allergies  No prescriptions prior to admission    No results found for this or any previous visit (from the past 48 hour(s)). No results found.  Review of Systems  Constitutional: Negative.   HENT: Negative.   Eyes: Negative.   Respiratory: Negative.   Cardiovascular: Positive for leg swelling.  Gastrointestinal: Negative.   Genitourinary: Negative.   Musculoskeletal: Positive for joint pain.  Skin: Negative.   Neurological: Negative.   Endo/Heme/Allergies: Negative.   Psychiatric/Behavioral: Negative.     There were no vitals taken for this visit. Physical Exam  Constitutional: She is oriented to person, place,  and time. She appears well-developed and well-nourished.  HENT:  Head: Normocephalic and atraumatic.  Eyes: EOM are normal. Pupils are equal, round, and reactive to light.  Neck: Normal range of motion.  Cardiovascular: Normal rate.   Respiratory: Effort normal.  GI: Soft.  Musculoskeletal:  Mild shortening of left leg.  Mild peripheral edema bilateral legs.  No pitting edema. Painful ROM of left hip.  FROM left knee  Neurological: She is alert and oriented to person, place, and time.  Skin: Skin is warm and dry.  Psychiatric: She has a normal mood and affect.     Assessment/Plan: S/p left hip hemiarthroplasty with new calcar fracture and collapse of prosthesis  PLAN: revision left hip hemiarthroplasty.  VERNON,SHEILA M 06/06/2012, 3:18 PM   Onset of pain was after she had worked a couple days with HHPT, no definite crack sound or sharp pain noted with therapy per patient. Several hrs after therapy could not walk.

## 2012-06-07 ENCOUNTER — Ambulatory Visit (HOSPITAL_COMMUNITY)
Admission: RE | Admit: 2012-06-07 | Discharge: 2012-06-07 | Disposition: A | Discharge status: Home / Self Care | Payer: Medicare Other | Source: Ambulatory Visit | Attending: Anesthesiology | Admitting: Anesthesiology

## 2012-06-07 ENCOUNTER — Other Ambulatory Visit (HOSPITAL_COMMUNITY): Payer: Self-pay | Admitting: Orthopaedic Surgery

## 2012-06-07 ENCOUNTER — Encounter (HOSPITAL_COMMUNITY): Payer: Self-pay

## 2012-06-07 ENCOUNTER — Encounter (HOSPITAL_COMMUNITY)
Admission: RE | Admit: 2012-06-07 | Discharge: 2012-06-07 | Disposition: A | Discharge status: Home / Self Care | Payer: Medicare Other | Source: Ambulatory Visit | Attending: Orthopaedic Surgery | Admitting: Orthopaedic Surgery

## 2012-06-07 DIAGNOSIS — R9431 Abnormal electrocardiogram [ECG] [EKG]: Secondary | ICD-10-CM | POA: Insufficient documentation

## 2012-06-07 DIAGNOSIS — M4120 Other idiopathic scoliosis, site unspecified: Secondary | ICD-10-CM | POA: Insufficient documentation

## 2012-06-07 DIAGNOSIS — Z01812 Encounter for preprocedural laboratory examination: Secondary | ICD-10-CM | POA: Insufficient documentation

## 2012-06-07 DIAGNOSIS — I447 Left bundle-branch block, unspecified: Secondary | ICD-10-CM | POA: Insufficient documentation

## 2012-06-07 DIAGNOSIS — Z01818 Encounter for other preprocedural examination: Secondary | ICD-10-CM | POA: Insufficient documentation

## 2012-06-07 DIAGNOSIS — Z01810 Encounter for preprocedural cardiovascular examination: Secondary | ICD-10-CM | POA: Insufficient documentation

## 2012-06-07 HISTORY — DX: Left bundle-branch block, unspecified: I44.7

## 2012-06-07 LAB — BASIC METABOLIC PANEL
CO2: 28 mEq/L (ref 19–32)
Calcium: 9.6 mg/dL (ref 8.4–10.5)
Creatinine, Ser: 0.5 mg/dL (ref 0.50–1.10)
GFR calc non Af Amer: >90 mL/min (ref >90)
Sodium: 127 mEq/L — ABNORMAL LOW (ref 135–145)

## 2012-06-07 LAB — CBC
MCH: 27.5 pg (ref 26.0–34.0)
MCV: 85 fL (ref 78.0–100.0)
Platelets: 451 10*3/uL — ABNORMAL HIGH (ref 150–400)
RDW: 13.8 % (ref 11.5–15.5)
WBC: 6.3 10*3/uL (ref 4.0–10.5)

## 2012-06-07 LAB — APTT: aPTT: 31 seconds (ref 24–37)

## 2012-06-07 LAB — PROTIME-INR: Prothrombin Time: 13.2 seconds (ref 11.6–15.2)

## 2012-06-07 MED ORDER — CEFAZOLIN SODIUM-DEXTROSE 2-3 GM-% IV SOLR
2.0000 g | INTRAVENOUS | Status: AC
  Administered 2012-06-08: 2 g via INTRAVENOUS
  Filled 2012-06-07: qty 50

## 2012-06-07 NOTE — Pre-Procedure Instructions (Signed)
[name]  06/07/2012   Your procedure is scheduled on:  Wed, Feb 12 @ 12:45 PM  Report to [facility] at 10:45 AM.  Call this number if you have problems the morning of surgery: [phone]   Remember:   Do not eat food or drink liquids after midnight.   Take these medicines the morning of surgery with A SIP OF WATER: Alprazolam(Xanax),Amlodipine(Norvasc),Citalopram(Celexa), and Pain Pill(if needed)   Do not wear jewelry, make-up or nail polish.  Do not wear lotions, powders, or perfumes. You may wear deodorant.  Do not shave 48 hours prior to surgery.   Do not bring valuables to the hospital.  Contacts, dentures or bridgework may not be worn into surgery.  Leave suitcase in the car. After surgery it may be brought to your room.  For patients admitted to the hospital, checkout time is 11:00 AM the day of  discharge.   Patients discharged the day of surgery will not be allowed to drive  home.    Special Instructions: Shower using CHG 2 nights before surgery and the night before surgery.  If you shower the day of surgery use CHG.  Use special wash - you have one bottle of CHG for all showers.  You should use approximately 1/3 of the bottle for each shower.   Please read over the following fact sheets that you were given: Pain Booklet, Coughing and Deep Breathing, Blood Transfusion Information, Total Joint Packet, MRSA Information and Surgical Site Infection Prevention

## 2012-06-07 NOTE — Progress Notes (Signed)
Pt doesn't have a cardiologist  Echo report in epic from 05/20/12 Denies stress test or heart cath  Medical MD is Dr.Michael Waters in [employer]  No ekg or cxr within past yr in epic and pt doesn't recall when last

## 2012-06-07 NOTE — Consult Note (Signed)
Anesthesia Chart Review:  Patient is a 79 year old female scheduled for left hip hemiarthroplasty revision by Dr. Ophelia Charter tomorrow.  Her PAT appointment was this afternoon.   History includes former smoker, HTN, murmur, DM2, OA, depression, anemia.  She is s/p left hip hemiarthroplasty on 05/20/12.  She was a transfer from [facility] and EKG done there on 05/19/12 showed SR, left BBB (see Media tab, Correspondence from encounter 05/19/12).  It was unclear if this finding was new, so patient did undergo medical clearance by Dr. [name] who ordered an echo (see below) which was "reassuring", so he did not recommend any further evaluation (see his 05/20/12 note in Epic).  PCP is Dr. Adonis Huguenin in [place], [place].    EKG today showed NSR, possible LAE, LAD, left BBB.  Overall, it appears stable since 05/19/12.    Echo on 05/20/12 showed: - Left ventricle: The cavity size was normal. Wall thickness was increased in a pattern of moderate LVH. The estimated ejection fraction was 50%. Wall motion was normal; there were no regional wall motion abnormalities. Doppler parameters are consistent with abnormal left ventricular relaxation (grade 1 diastolic dysfunction). - Aortic valve: There was very mild stenosis. - Right ventricle: The cavity size was mildly dilated. Wall thickness was normal. - Tricuspid valve: Mild-moderate regurgitation. - Pulmonary arteries: PA peak pressure: 50mm Hg (S).  CXR on 06/07/12 showed: No active disease. Mild thoracic dextroscoliosis. Borderline cardiomegaly again noted.   Pre-operative labs noted.  H/H 9.2/28.4, but has increased since 05/25/12.  Na 127, was previously 129-131.   I called these results to [facility] at Dr. Ophelia Charter' office.  I'll change her T&S to a T&C for 2 Units PRBC and repeat a STAT BMET on arrival to re-evaluate hyponatremia.  She had medical clearance for her previous surgery last month due to finding of left BBB, so would anticipate she could proceed  if her Na is improved on repeat labs.    Shonna Chock, PA-C 06/07/12 1537

## 2012-06-08 ENCOUNTER — Encounter (HOSPITAL_COMMUNITY)
Admission: RE | Disposition: A | Discharge status: Skilled Nursing Facility | Payer: Self-pay | Source: Ambulatory Visit | Attending: Orthopaedic Surgery

## 2012-06-08 ENCOUNTER — Encounter (HOSPITAL_COMMUNITY): Payer: Self-pay | Admitting: *Deleted

## 2012-06-08 ENCOUNTER — Other Ambulatory Visit (HOSPITAL_COMMUNITY): Payer: Self-pay | Admitting: Orthopaedic Surgery

## 2012-06-08 ENCOUNTER — Inpatient Hospital Stay (HOSPITAL_COMMUNITY): Payer: Medicare Other

## 2012-06-08 ENCOUNTER — Encounter (HOSPITAL_COMMUNITY): Payer: Self-pay | Admitting: Vascular Surgery

## 2012-06-08 ENCOUNTER — Inpatient Hospital Stay (HOSPITAL_COMMUNITY): Payer: Medicare Other | Admitting: Anesthesiology

## 2012-06-08 ENCOUNTER — Inpatient Hospital Stay (HOSPITAL_COMMUNITY)
Admission: RE | Admit: 2012-06-08 | Discharge: 2012-06-11 | DRG: 467 | Disposition: A | Discharge status: Skilled Nursing Facility | Payer: Medicare Other | Source: Ambulatory Visit | Attending: Orthopaedic Surgery | Admitting: Orthopaedic Surgery

## 2012-06-08 DIAGNOSIS — E119 Type 2 diabetes mellitus without complications: Secondary | ICD-10-CM | POA: Diagnosis present

## 2012-06-08 DIAGNOSIS — Z87891 Personal history of nicotine dependence: Secondary | ICD-10-CM

## 2012-06-08 DIAGNOSIS — Z7982 Long term (current) use of aspirin: Secondary | ICD-10-CM

## 2012-06-08 DIAGNOSIS — I10 Essential (primary) hypertension: Secondary | ICD-10-CM | POA: Diagnosis present

## 2012-06-08 DIAGNOSIS — Z9660 Presence of unspecified orthopedic joint implant: Principal | ICD-10-CM | POA: Diagnosis present

## 2012-06-08 DIAGNOSIS — F329 Major depressive disorder, single episode, unspecified: Secondary | ICD-10-CM | POA: Diagnosis present

## 2012-06-08 DIAGNOSIS — Y831 Surgical operation with implant of artificial internal device as the cause of abnormal reaction of the patient, or of later complication, without mention of misadventure at the time of the procedure: Secondary | ICD-10-CM | POA: Diagnosis present

## 2012-06-08 DIAGNOSIS — Y92009 Unspecified place in unspecified non-institutional (private) residence as the place of occurrence of the external cause: Secondary | ICD-10-CM

## 2012-06-08 DIAGNOSIS — Z96649 Presence of unspecified artificial hip joint: Secondary | ICD-10-CM

## 2012-06-08 DIAGNOSIS — F3289 Other specified depressive episodes: Secondary | ICD-10-CM | POA: Diagnosis present

## 2012-06-08 DIAGNOSIS — Z01812 Encounter for preprocedural laboratory examination: Secondary | ICD-10-CM

## 2012-06-08 DIAGNOSIS — D62 Acute posthemorrhagic anemia: Secondary | ICD-10-CM | POA: Diagnosis not present

## 2012-06-08 DIAGNOSIS — T84049A Periprosthetic fracture around unspecified internal prosthetic joint, initial encounter: Principal | ICD-10-CM | POA: Diagnosis present

## 2012-06-08 DIAGNOSIS — S7292XA Unspecified fracture of left femur, initial encounter for closed fracture: Secondary | ICD-10-CM

## 2012-06-08 HISTORY — PX: TOTAL HIP REVISION: SHX763

## 2012-06-08 LAB — URINALYSIS, ROUTINE W REFLEX MICROSCOPIC
Hgb urine dipstick: NEGATIVE
Leukocytes, UA: NEGATIVE
Specific Gravity, Urine: 1.008 (ref 1.005–1.030)
Urobilinogen, UA: 0.2 mg/dL (ref 0.0–1.0)

## 2012-06-08 LAB — GLUCOSE, CAPILLARY
Glucose-Capillary: 99 mg/dL (ref 70–99)
Glucose-Capillary: 99 mg/dL (ref 70–99)
Glucose-Capillary: 168 mg/dL — ABNORMAL HIGH (ref 70–99)

## 2012-06-08 LAB — BASIC METABOLIC PANEL
GFR calc Af Amer: >90 mL/min (ref >90)
GFR calc non Af Amer: 86 mL/min — ABNORMAL LOW (ref >90)
Glucose, Bld: 105 mg/dL — ABNORMAL HIGH (ref 70–99)
Potassium: 4.6 mEq/L (ref 3.5–5.1)
Sodium: 129 mEq/L — ABNORMAL LOW (ref 135–145)

## 2012-06-08 LAB — PREPARE RBC (CROSSMATCH)

## 2012-06-08 SURGERY — TOTAL HIP REVISION
Anesthesia: General | Laterality: Left | Wound class: Clean

## 2012-06-08 MED ORDER — FENTANYL CITRATE 0.05 MG/ML IJ SOLN
INTRAMUSCULAR | Status: DC | PRN
  Administered 2012-06-08: 100 ug via INTRAVENOUS
  Administered 2012-06-08 (×3): 50 ug via INTRAVENOUS

## 2012-06-08 MED ORDER — MELOXICAM 15 MG PO TABS
15.0000 mg | ORAL_TABLET | Freq: Every day | ORAL | Status: DC
  Administered 2012-06-08 – 2012-06-11 (×4): 15 mg via ORAL
  Filled 2012-06-08 (×4): qty 1

## 2012-06-08 MED ORDER — DIPHENHYDRAMINE HCL 12.5 MG/5ML PO ELIX: 12.5000 mg | ORAL_SOLUTION | ORAL | Status: DC | PRN

## 2012-06-08 MED ORDER — METFORMIN HCL ER 500 MG PO TB24
500.0000 mg | ORAL_TABLET | Freq: Every day | ORAL | Status: DC
  Administered 2012-06-09 – 2012-06-11 (×3): 500 mg via ORAL
  Filled 2012-06-08 (×4): qty 1

## 2012-06-08 MED ORDER — CITALOPRAM HYDROBROMIDE 10 MG PO TABS
10.0000 mg | ORAL_TABLET | Freq: Every day | ORAL | Status: DC
  Administered 2012-06-09 – 2012-06-11 (×3): 10 mg via ORAL
  Filled 2012-06-08 (×3): qty 1

## 2012-06-08 MED ORDER — HYDROCODONE-ACETAMINOPHEN 5-325 MG PO TABS
1.0000 | ORAL_TABLET | ORAL | Status: DC | PRN
  Administered 2012-06-08 – 2012-06-11 (×9): 1 via ORAL
  Filled 2012-06-08 (×9): qty 1

## 2012-06-08 MED ORDER — AMLODIPINE BESYLATE 5 MG PO TABS
5.0000 mg | ORAL_TABLET | Freq: Every day | ORAL | Status: DC
  Administered 2012-06-09 – 2012-06-11 (×3): 5 mg via ORAL
  Filled 2012-06-08 (×3): qty 1

## 2012-06-08 MED ORDER — OXYCODONE HCL 5 MG PO TABS
5.0000 mg | ORAL_TABLET | ORAL | Status: DC | PRN
  Administered 2012-06-09: 10 mg via ORAL
  Administered 2012-06-09: 5 mg via ORAL
  Administered 2012-06-09: 10 mg via ORAL
  Filled 2012-06-08: qty 1
  Filled 2012-06-08 (×2): qty 2

## 2012-06-08 MED ORDER — ACETAMINOPHEN 325 MG PO TABS
650.0000 mg | ORAL_TABLET | Freq: Four times a day (QID) | ORAL | Status: DC | PRN
  Administered 2012-06-09: 650 mg via ORAL
  Filled 2012-06-08: qty 2

## 2012-06-08 MED ORDER — DEXAMETHASONE SODIUM PHOSPHATE 10 MG/ML IJ SOLN
INTRAMUSCULAR | Status: DC | PRN
  Administered 2012-06-08: 8 mg via INTRAVENOUS

## 2012-06-08 MED ORDER — BISACODYL 10 MG RE SUPP
10.0000 mg | Freq: Every day | RECTAL | Status: DC | PRN
  Administered 2012-06-11: 10 mg via RECTAL
  Filled 2012-06-08: qty 1

## 2012-06-08 MED ORDER — POTASSIUM CHLORIDE IN NACL 20-0.45 MEQ/L-% IV SOLN
INTRAVENOUS | Status: DC
  Administered 2012-06-08: 23:00:00 via INTRAVENOUS
  Filled 2012-06-08 (×7): qty 1000

## 2012-06-08 MED ORDER — OXYCODONE HCL 5 MG PO TABS: 5.0000 mg | ORAL_TABLET | Freq: Once | ORAL | Status: DC | PRN

## 2012-06-08 MED ORDER — FLEET ENEMA 7-19 GM/118ML RE ENEM: 1.0000 | ENEMA | Freq: Once | RECTAL | Status: AC | PRN

## 2012-06-08 MED ORDER — ONDANSETRON HCL 4 MG/2ML IJ SOLN
INTRAMUSCULAR | Status: DC | PRN
  Administered 2012-06-08: 4 mg via INTRAVENOUS

## 2012-06-08 MED ORDER — METOCLOPRAMIDE HCL 10 MG PO TABS: 5.0000 mg | ORAL_TABLET | Freq: Three times a day (TID) | ORAL | Status: DC | PRN

## 2012-06-08 MED ORDER — LACTATED RINGERS IV SOLN
INTRAVENOUS | Status: DC | PRN
  Administered 2012-06-08 (×2): via INTRAVENOUS

## 2012-06-08 MED ORDER — MORPHINE SULFATE 2 MG/ML IJ SOLN
INTRAMUSCULAR | Status: AC
  Administered 2012-06-08: 1 mg via INTRAVENOUS
  Filled 2012-06-08: qty 1

## 2012-06-08 MED ORDER — FERROUS GLUCONATE 324 (38 FE) MG PO TABS
325.0000 mg | ORAL_TABLET | Freq: Two times a day (BID) | ORAL | Status: DC
  Administered 2012-06-08 – 2012-06-09 (×3): 324 mg via ORAL
  Administered 2012-06-10: 325 mg via ORAL
  Administered 2012-06-10 – 2012-06-11 (×2): 324 mg via ORAL
  Filled 2012-06-08 (×9): qty 1

## 2012-06-08 MED ORDER — PROPOFOL 10 MG/ML IV BOLUS
INTRAVENOUS | Status: DC | PRN
  Administered 2012-06-08: 20 mg via INTRAVENOUS
  Administered 2012-06-08: 140 mg via INTRAVENOUS

## 2012-06-08 MED ORDER — METHOCARBAMOL 500 MG PO TABS
500.0000 mg | ORAL_TABLET | Freq: Four times a day (QID) | ORAL | Status: DC | PRN
  Administered 2012-06-08 – 2012-06-11 (×4): 500 mg via ORAL
  Filled 2012-06-08 (×4): qty 1

## 2012-06-08 MED ORDER — MENTHOL 3 MG MT LOZG
1.0000 | LOZENGE | OROMUCOSAL | Status: DC | PRN
  Filled 2012-06-08: qty 9

## 2012-06-08 MED ORDER — PHENYLEPHRINE HCL 10 MG/ML IJ SOLN
INTRAMUSCULAR | Status: DC | PRN
  Administered 2012-06-08 (×2): 40 ug via INTRAVENOUS

## 2012-06-08 MED ORDER — SENNOSIDES-DOCUSATE SODIUM 8.6-50 MG PO TABS: 1.0000 | ORAL_TABLET | Freq: Every evening | ORAL | Status: DC | PRN

## 2012-06-08 MED ORDER — ONDANSETRON HCL 4 MG PO TABS: 4.0000 mg | ORAL_TABLET | Freq: Four times a day (QID) | ORAL | Status: DC | PRN

## 2012-06-08 MED ORDER — LISINOPRIL 20 MG PO TABS
20.0000 mg | ORAL_TABLET | Freq: Every day | ORAL | Status: DC
  Administered 2012-06-08 – 2012-06-11 (×4): 20 mg via ORAL
  Filled 2012-06-08 (×4): qty 1

## 2012-06-08 MED ORDER — METOCLOPRAMIDE HCL 5 MG/ML IJ SOLN: 5.0000 mg | Freq: Three times a day (TID) | INTRAMUSCULAR | Status: DC | PRN

## 2012-06-08 MED ORDER — ALBUMIN HUMAN 5 % IV SOLN
INTRAVENOUS | Status: DC | PRN
  Administered 2012-06-08: 15:00:00 via INTRAVENOUS

## 2012-06-08 MED ORDER — INSULIN ASPART 100 UNIT/ML ~~LOC~~ SOLN: 0.0000 [IU] | Freq: Three times a day (TID) | SUBCUTANEOUS | Status: DC

## 2012-06-08 MED ORDER — FENTANYL CITRATE 0.05 MG/ML IJ SOLN
25.0000 ug | INTRAMUSCULAR | Status: DC | PRN
  Administered 2012-06-08: 50 ug via INTRAVENOUS
  Administered 2012-06-08 (×2): 25 ug via INTRAVENOUS

## 2012-06-08 MED ORDER — FUROSEMIDE 10 MG/ML IJ SOLN
20.0000 mg | Freq: Once | INTRAMUSCULAR | Status: AC
  Administered 2012-06-08: 20 mg via INTRAVENOUS
  Filled 2012-06-08: qty 2

## 2012-06-08 MED ORDER — METHOCARBAMOL 100 MG/ML IJ SOLN
500.0000 mg | Freq: Four times a day (QID) | INTRAVENOUS | Status: DC | PRN
  Filled 2012-06-08: qty 5

## 2012-06-08 MED ORDER — ROCURONIUM BROMIDE 100 MG/10ML IV SOLN
INTRAVENOUS | Status: DC | PRN
  Administered 2012-06-08 (×2): 10 mg via INTRAVENOUS
  Administered 2012-06-08: 40 mg via INTRAVENOUS

## 2012-06-08 MED ORDER — ASPIRIN EC 325 MG PO TBEC
325.0000 mg | DELAYED_RELEASE_TABLET | Freq: Every day | ORAL | Status: DC
  Administered 2012-06-09 – 2012-06-11 (×3): 325 mg via ORAL
  Filled 2012-06-08 (×4): qty 1

## 2012-06-08 MED ORDER — NEOSTIGMINE METHYLSULFATE 1 MG/ML IJ SOLN
INTRAMUSCULAR | Status: DC | PRN
  Administered 2012-06-08: 3.5 mg via INTRAVENOUS

## 2012-06-08 MED ORDER — CHLORHEXIDINE GLUCONATE 4 % EX LIQD: 60.0000 mL | Freq: Once | CUTANEOUS | Status: DC

## 2012-06-08 MED ORDER — DOCUSATE SODIUM 100 MG PO CAPS
100.0000 mg | ORAL_CAPSULE | Freq: Two times a day (BID) | ORAL | Status: DC
  Administered 2012-06-08 – 2012-06-11 (×6): 100 mg via ORAL
  Filled 2012-06-08 (×6): qty 1

## 2012-06-08 MED ORDER — LACTATED RINGERS IV SOLN
INTRAVENOUS | Status: DC
  Administered 2012-06-08: 12:00:00 via INTRAVENOUS

## 2012-06-08 MED ORDER — GLYCOPYRROLATE 0.2 MG/ML IJ SOLN
INTRAMUSCULAR | Status: DC | PRN
  Administered 2012-06-08: 0.4 mg via INTRAVENOUS

## 2012-06-08 MED ORDER — ONDANSETRON HCL 4 MG/2ML IJ SOLN: 4.0000 mg | Freq: Four times a day (QID) | INTRAMUSCULAR | Status: DC | PRN

## 2012-06-08 MED ORDER — ACETAMINOPHEN 650 MG RE SUPP: 650.0000 mg | Freq: Four times a day (QID) | RECTAL | Status: DC | PRN

## 2012-06-08 MED ORDER — 0.9 % SODIUM CHLORIDE (POUR BTL) OPTIME
TOPICAL | Status: DC | PRN
  Administered 2012-06-08: 1000 mL

## 2012-06-08 MED ORDER — LIDOCAINE HCL (CARDIAC) 20 MG/ML IV SOLN
INTRAVENOUS | Status: DC | PRN
  Administered 2012-06-08: 80 mg via INTRAVENOUS

## 2012-06-08 MED ORDER — CEFAZOLIN SODIUM 1-5 GM-% IV SOLN
1.0000 g | Freq: Four times a day (QID) | INTRAVENOUS | Status: AC
  Administered 2012-06-08 – 2012-06-09 (×2): 1 g via INTRAVENOUS
  Filled 2012-06-08 (×2): qty 50

## 2012-06-08 MED ORDER — PHENOL 1.4 % MT LIQD: 1.0000 | OROMUCOSAL | Status: DC | PRN

## 2012-06-08 MED ORDER — OXYCODONE HCL 5 MG/5ML PO SOLN: 5.0000 mg | Freq: Once | ORAL | Status: DC | PRN

## 2012-06-08 MED ORDER — ALPRAZOLAM 0.5 MG PO TABS: 0.5000 mg | ORAL_TABLET | Freq: Every evening | ORAL | Status: DC | PRN

## 2012-06-08 MED ORDER — LOXAPINE SUCCINATE 10 MG PO CAPS
20.0000 mg | ORAL_CAPSULE | Freq: Every day | ORAL | Status: DC
  Administered 2012-06-08 – 2012-06-10 (×3): 20 mg via ORAL
  Filled 2012-06-08 (×4): qty 2

## 2012-06-08 MED ORDER — FENTANYL CITRATE 0.05 MG/ML IJ SOLN
INTRAMUSCULAR | Status: AC
  Filled 2012-06-08: qty 2

## 2012-06-08 MED ORDER — MORPHINE SULFATE 2 MG/ML IJ SOLN
1.0000 mg | INTRAMUSCULAR | Status: DC | PRN
  Administered 2012-06-08 – 2012-06-09 (×3): 1 mg via INTRAVENOUS
  Filled 2012-06-08 (×2): qty 1

## 2012-06-08 SURGICAL SUPPLY — 65 items
BENZOIN TINCTURE PRP APPL 2/3 (GAUZE/BANDAGES/DRESSINGS) ×2 IMPLANT
BLADE SURG ROTATE 9660 (MISCELLANEOUS) IMPLANT
BRUSH FEMORAL CANAL (MISCELLANEOUS) IMPLANT
CLOTH BEACON ORANGE TIMEOUT ST (SAFETY) ×2 IMPLANT
COVER BACK TABLE 24X17X13 BIG (DRAPES) IMPLANT
COVER SURGICAL LIGHT HANDLE (MISCELLANEOUS) ×2 IMPLANT
DRAPE C-ARM 42X72 X-RAY (DRAPES) IMPLANT
DRAPE INCISE IOBAN 66X45 STRL (DRAPES) IMPLANT
DRAPE ORTHO SPLIT 77X108 STRL (DRAPES) ×2
DRAPE PROXIMA HALF (DRAPES) ×8 IMPLANT
DRAPE SURG ORHT 6 SPLT 77X108 (DRAPES) ×2 IMPLANT
DRAPE U-SHAPE 47X51 STRL (DRAPES) ×2 IMPLANT
DRSG MEPILEX BORDER 4X12 (GAUZE/BANDAGES/DRESSINGS) ×2 IMPLANT
DRSG PAD ABDOMINAL 8X10 ST (GAUZE/BANDAGES/DRESSINGS) ×4 IMPLANT
DURAPREP 26ML APPLICATOR (WOUND CARE) ×2 IMPLANT
ELECT CAUTERY BLADE 6.4 (BLADE) ×2 IMPLANT
ELECT REM PT RETURN 9FT ADLT (ELECTROSURGICAL) ×2
ELECTRODE REM PT RTRN 9FT ADLT (ELECTROSURGICAL) ×1 IMPLANT
EVACUATOR 1/8 PVC DRAIN (DRAIN) IMPLANT
FACESHIELD LNG OPTICON STERILE (SAFETY) ×4 IMPLANT
GAUZE XEROFORM 5X9 LF (GAUZE/BANDAGES/DRESSINGS) ×2 IMPLANT
GLOVE BIOGEL PI IND STRL 7.5 (GLOVE) ×1 IMPLANT
GLOVE BIOGEL PI IND STRL 8 (GLOVE) ×1 IMPLANT
GLOVE BIOGEL PI INDICATOR 7.5 (GLOVE) ×1
GLOVE BIOGEL PI INDICATOR 8 (GLOVE) ×1
GLOVE ECLIPSE 7.0 STRL STRAW (GLOVE) ×2 IMPLANT
GLOVE ORTHO TXT STRL SZ7.5 (GLOVE) ×2 IMPLANT
GOWN PREVENTION PLUS LG XLONG (DISPOSABLE) IMPLANT
GOWN PREVENTION PLUS XLARGE (GOWN DISPOSABLE) ×2 IMPLANT
GOWN STRL NON-REIN LRG LVL3 (GOWN DISPOSABLE) ×2 IMPLANT
HANDPIECE INTERPULSE COAX TIP (DISPOSABLE)
IMMOBILIZER KNEE 20 (SOFTGOODS)
IMMOBILIZER KNEE 20 THIGH 36 (SOFTGOODS) IMPLANT
IMMOBILIZER KNEE 22 UNIV (SOFTGOODS) ×2 IMPLANT
IMMOBILIZER KNEE 24 THIGH 36 (MISCELLANEOUS) IMPLANT
IMMOBILIZER KNEE 24 UNIV (MISCELLANEOUS)
KIT BASIN OR (CUSTOM PROCEDURE TRAY) ×2 IMPLANT
KIT REMOVER STAPLE SKIN (MISCELLANEOUS) ×2 IMPLANT
KIT ROOM TURNOVER OR (KITS) ×2 IMPLANT
MANIFOLD NEPTUNE II (INSTRUMENTS) ×2 IMPLANT
NEEDLE 1/2 CIR MAYO (NEEDLE) ×2 IMPLANT
NEEDLE HYPO 25GX1X1/2 BEV (NEEDLE) ×2 IMPLANT
NS IRRIG 1000ML POUR BTL (IV SOLUTION) ×2 IMPLANT
PACK TOTAL JOINT (CUSTOM PROCEDURE TRAY) ×2 IMPLANT
PAD ARMBOARD 7.5X6 YLW CONV (MISCELLANEOUS) ×4 IMPLANT
REAMER ROD DEEP FLUTE 2.5X950 (INSTRUMENTS) IMPLANT
SET HNDPC FAN SPRY TIP SCT (DISPOSABLE) IMPLANT
SLEEVE CABLE 2MM VT (Orthopedic Implant) ×6 IMPLANT
SLEEVE SURGEON STRL (DRAPES) ×2 IMPLANT
SPONGE GAUZE 4X4 12PLY (GAUZE/BANDAGES/DRESSINGS) ×2 IMPLANT
SPONGE LAP 4X18 X RAY DECT (DISPOSABLE) ×4 IMPLANT
STAPLER VISISTAT 35W (STAPLE) ×2 IMPLANT
SUCTION FRAZIER TIP 10 FR DISP (SUCTIONS) ×2 IMPLANT
SUT ETHIBOND NAB CT1 #1 30IN (SUTURE) ×6 IMPLANT
SUT TICRON (SUTURE) ×4 IMPLANT
SUT VIC AB 2-0 CT1 27 (SUTURE) ×3
SUT VIC AB 2-0 CT1 TAPERPNT 27 (SUTURE) ×3 IMPLANT
SUT VICRYL 0 TIES 12 18 (SUTURE) ×2 IMPLANT
SYR CONTROL 10ML LL (SYRINGE) ×2 IMPLANT
TOWEL OR 17X24 6PK STRL BLUE (TOWEL DISPOSABLE) ×2 IMPLANT
TOWEL OR 17X26 10 PK STRL BLUE (TOWEL DISPOSABLE) ×2 IMPLANT
TOWER CARTRIDGE SMART MIX (DISPOSABLE) IMPLANT
TRAY FOLEY CATH 14FR (SET/KITS/TRAYS/PACK) ×2 IMPLANT
WATER STERILE IRR 1000ML POUR (IV SOLUTION) ×8 IMPLANT
solution 12/14 straight small stature 8"  12.0mm (Stem) ×2 IMPLANT

## 2012-06-08 NOTE — Preoperative (Signed)
Beta Blockers   Reason not to administer Beta Blockers:Not Applicable 

## 2012-06-08 NOTE — Anesthesia Preprocedure Evaluation (Addendum)
Anesthesia Evaluation  Patient identified by MRN, date of birth, ID band Patient awake    Reviewed: Allergy & Precautions, H&P , NPO status , Patient's Chart, lab work & pertinent test results  Airway Mallampati: II  Neck ROM: full    Dental  (+) Edentulous Upper, Dental Advisory Given and Edentulous Lower   Pulmonary  breath sounds clear to auscultation        Cardiovascular hypertension, Pt. on medications + dysrhythmias Rhythm:Regular     Neuro/Psych Depression Bell's palsy    GI/Hepatic   Endo/Other  diabetes, Well Controlled, Type 2  Renal/GU      Musculoskeletal  (+) Arthritis -,   Abdominal (+)  Abdomen: soft. Bowel sounds: normal.  Peds  Hematology   Anesthesia Other Findings   Reproductive/Obstetrics                         Anesthesia Physical Anesthesia Plan  ASA: II  Anesthesia Plan: General   Post-op Pain Management:    Induction: Intravenous  Airway Management Planned: Oral ETT  Additional Equipment:   Intra-op Plan:   Post-operative Plan: Extubation in OR  Informed Consent: I have reviewed the patients History and Physical, chart, labs and discussed the procedure including the risks, benefits and alternatives for the proposed anesthesia with the patient or authorized representative who has indicated his/her understanding and acceptance.   Dental advisory given  Plan Discussed with: CRNA and Surgeon  Anesthesia Plan Comments:        Anesthesia Quick Evaluation

## 2012-06-08 NOTE — Anesthesia Procedure Notes (Signed)
Procedure Name: Intubation Date/Time: 06/08/2012 12:48 PM Performed by: Ellin Goodie Pre-anesthesia Checklist: Patient identified, Emergency Drugs available, Suction available, Patient being monitored and Timeout performed Patient Re-evaluated:Patient Re-evaluated prior to inductionOxygen Delivery Method: Circle system utilized Preoxygenation: Pre-oxygenation with 100% oxygen Intubation Type: IV induction Ventilation: Mask ventilation without difficulty Laryngoscope Size: Mac and 3 Grade View: Grade I Tube type: Oral Tube size: 7.5 mm Number of attempts: 1 Airway Equipment and Method: Stylet Placement Confirmation: ETT inserted through vocal cords under direct vision,  positive ETCO2 and breath sounds checked- equal and bilateral Secured at: 23 cm Tube secured with: Tape Dental Injury: Teeth and Oropharynx as per pre-operative assessment

## 2012-06-08 NOTE — Transfer of Care (Signed)
Immediate Anesthesia Transfer of Care Note  Patient: [name]  Procedure(s) Performed: Procedure(s) with comments: TOTAL HIP REVISION (Left) - Revision left hip hemiarthroplasty for periprosthetic fracture  Patient Location: PACU  Anesthesia Type:General  Level of Consciousness: awake, alert  and oriented  Airway & Oxygen Therapy: Patient connected to face mask  Post-op Assessment: Report given to PACU RN  Post vital signs: stable  Complications: No apparent anesthesia complications

## 2012-06-08 NOTE — Evaluation (Addendum)
Physical Therapy Evaluation Patient Details Name: [name] MRN: [id number] DOB: [birth year] Today's Date: 06/08/2012 Time: 2045-2123 PT Time Calculation (min): 38 min  PT Assessment / Plan / Recommendation Clinical Impression  Pt is a 79 y/o female s/p revision of L Hemi hip arthroplasty to THA.   Pt reports having 24/7 assistance at home and plans to return to home if able.  Pt currently requires skilled assistance for mobility and has difficulty adhering to weight bearing restrictions.  Acute PT to follow pt and progress functional mobility.  Pt may benefit from short term SNF consideration if mobility is slow to progress.     PT Assessment  Patient needs continued PT services    Follow Up Recommendations  Home health PT;Supervision/Assistance - 24 hour (may need SNF if mobility slow to improve. )    Does the patient have the potential to tolerate intense rehabilitation      Barriers to Discharge None      Equipment Recommendations  None recommended by PT    Recommendations for Other Services     Frequency 7X/week    Precautions / Restrictions Precautions Precautions: Posterior Hip Precaution Booklet Issued: Yes (comment) Precaution Comments: Reviewed all precautions with pt and daughter Required Braces or Orthoses: Knee Immobilizer - Left Knee Immobilizer - Left: Other (comment) (Unspecified,  ) Restrictions Weight Bearing Restrictions: Yes LLE Weight Bearing: Touchdown weight bearing   Pertinent Vitals/Pain 9/10 pain in L hip.  Pt medicated during session.  Pt to receive 2 units of blood this evening.       Mobility  Bed Mobility Bed Mobility: Supine to Sit;Sit to Supine;Sitting - Scoot to Edge of Bed Supine to Sit: 1: +1 Total assist;HOB elevated Sitting - Scoot to Edge of Bed: 2: Max assist Sit to Supine: 1: +2 Total assist;HOB flat Sit to Supine: Patient Percentage: 10% Details for Bed Mobility Assistance: Assist for bilateral LEs and trunk secondary to L LE  pain and generalized weakness.   Transfers Transfers: Sit to Stand;Stand to Sit Sit to Stand: 2: Max assist;From bed;With upper extremity assist Stand to Sit: 4: Min assist;To bed;With upper extremity assist Stand Pivot Transfers: Not tested (comment) Details for Transfer Assistance: Verbal cues for hand placement and TDWB on LLE.  Assist to rise to standing and control descent to chair.  Pt not maintaining TDWB in LLE in standing.   Ambulation/Gait Ambulation/Gait Assistance: Not tested (comment)    Exercises     PT Diagnosis: Difficulty walking;Generalized weakness;Acute pain  PT Problem List: Decreased strength;Decreased range of motion;Decreased activity tolerance;Decreased balance;Decreased mobility;Decreased knowledge of use of DME;Decreased knowledge of precautions;Pain PT Treatment Interventions: DME instruction;Gait training;Functional mobility training;Therapeutic exercise;Patient/family education   PT Goals Acute Rehab PT Goals PT Goal Formulation: With patient Time For Goal Achievement: 06/22/12 Potential to Achieve Goals: Good Pt will go Supine/Side to Sit: with HOB 0 degrees;with min assist PT Goal: Supine/Side to Sit - Progress: Goal set today Pt will go Sit to Supine/Side: with min assist;with HOB 0 degrees PT Goal: Sit to Supine/Side - Progress: Goal set today Pt will go Sit to Stand: with min assist PT Goal: Sit to Stand - Progress: Goal set today Pt will go Stand to Sit: with min assist PT Goal: Stand to Sit - Progress: Goal set today Pt will Ambulate: 1 - 15 feet;with min assist;with rolling walker PT Goal: Ambulate - Progress: Goal set today Pt will Perform Home Exercise Program: with supervision, verbal cues required/provided PT Goal: Perform Home Exercise Program -  Progress: Goal set today  Visit Information  Last PT Received On: 06/08/12    Subjective Data  Subjective: Agree to PT eval Patient Stated Goal: Walk   Prior Functioning  Home Living Lives  With: Son;Friend(s) Available Help at Discharge: Family;Friend(s);Available 24 hours/day Type of Home: House Home Access: Level entry Home Layout: Two level;Able to live on main level with bedroom/bathroom;Laundry or work area in basement [employer] Shower/Tub: Engineer, manufacturing systems: Standard Home Adaptive Equipment: Environmental consultant - rolling;Straight cane;Bedside commode/3-in-1;Shower chair with back Prior Function Level of Independence: Independent with assistive device(s) Able to Take Stairs?: Yes Driving: No Vocation: Retired Musician: No difficulties    Copywriter, advertising Overall Cognitive Status: Appears within functional limits for tasks assessed/performed Arousal/Alertness: Awake/alert Orientation Level: Appears intact for tasks assessed Behavior During Session: [facility] for tasks performed    Extremity/Trunk Assessment Right Upper Extremity Assessment RUE ROM/Strength/Tone: [facility] for tasks assessed Left Upper Extremity Assessment LUE ROM/Strength/Tone: WFL for tasks assessed Right Lower Extremity Assessment RLE ROM/Strength/Tone: Deficits RLE ROM/Strength/Tone Deficits: General weakness 4/5 RLE Sensation: WFL - Light Touch RLE Coordination: WFL - gross motor Left Lower Extremity Assessment LLE ROM/Strength/Tone: Deficits;Unable to fully assess;Due to pain LLE ROM/Strength/Tone Deficits: Strength and ROM limited secondary to surgery. LLE Sensation: WFL - Light Touch Trunk Assessment Trunk Assessment: Normal   Balance Balance Balance Assessed: Yes Static Sitting Balance Static Sitting - Balance Support: Bilateral upper extremity supported;Feet supported Static Sitting - Level of Assistance: 5: Stand by assistance Static Sitting - Comment/# of Minutes: 2+ minutes sitting on EOB with no LOB or c/o dizziness.    End of Session PT - End of Session Equipment Utilized During Treatment: Gait belt;Left knee immobilizer Activity Tolerance: Patient tolerated  treatment well Patient left: in bed;with call bell/phone within reach;with family/visitor present;with bed alarm set Nurse Communication: Mobility status  GP     Demarrius Guerrero 06/08/2012, 9:44 PM Laquanda Bick L. Kevon Tench DPT [phone]

## 2012-06-08 NOTE — Interval H&P Note (Signed)
History and Physical Interval Note:  06/08/2012 12:38 PM  [name]  has presented today for surgery, with the diagnosis of Left femur periprosthetic fracture  The various methods of treatment have been discussed with the patient and family. After consideration of risks, benefits and other options for treatment, the patient has consented to  Procedure(s) with comments: TOTAL HIP REVISION (Left) - Revision left hip hemiarthroplasty for periprosthetic fracture as a surgical intervention .  The patient's history has been reviewed, patient examined, no change in status, stable for surgery.  I have reviewed the patient's chart and labs.  Questions were answered to the patient's satisfaction.     Chan Sheahan C

## 2012-06-08 NOTE — Brief Op Note (Cosign Needed Addendum)
06/08/2012  3:12 PM  PATIENT:  [name]  79 y.o. female  PRE-OPERATIVE DIAGNOSIS:  Left femur periprosthetic fracture  POST-OPERATIVE DIAGNOSIS:  Left femur periprosthetic fracture  PROCEDURE:  Procedure(s) with comments: TOTAL HIP REVISION (Left) - Revision left hip hemiarthroplasty for periprosthetic fracture  SURGEON:  Surgeon(s) and Role:Mark Yates MD    * Eldred Manges, MD - Primary  PHYSICIAN ASSISTANT: Maud Deed [facility]  ASSISTANTS: none   ANESTHESIA:   general  EBL:  Total I/O In: 2250 [I.V.:2000; IV Piggyback:250] Out: 1500 [Urine:1000; Blood:500]  BLOOD ADMINISTERED:none  DRAINS: none   LOCAL MEDICATIONS USED:  NONE  SPECIMEN:  No Specimen  DISPOSITION OF SPECIMEN:  N/A  COUNTS:  YES  TOURNIQUET:  * No tourniquets in log *  DICTATION: .Note written in EPIC  PLAN OF CARE: Admit to inpatient   PATIENT DISPOSITION:  PACU - hemodynamically stable.   Delay start of Pharmacological VTE agent (>24hrs) due to surgical blood loss or risk of bleeding: yes

## 2012-06-08 NOTE — Anesthesia Postprocedure Evaluation (Signed)
  Anesthesia Post-op Note  Patient: [name] Gave  Procedure(s) Performed: Procedure(s) with comments: TOTAL HIP REVISION (Left) - Revision left hip hemiarthroplasty for periprosthetic fracture  Patient Location: PACU  Anesthesia Type:General  Level of Consciousness: awake  Airway and Oxygen Therapy: Patient Spontanous Breathing  Post-op Pain: mild  Post-op Assessment: Post-op Vital signs reviewed  Post-op Vital Signs: Reviewed  Complications: No apparent anesthesia complications

## 2012-06-09 ENCOUNTER — Encounter (HOSPITAL_COMMUNITY): Payer: Self-pay | Admitting: General Practice

## 2012-06-09 DIAGNOSIS — S7292XA Unspecified fracture of left femur, initial encounter for closed fracture: Secondary | ICD-10-CM

## 2012-06-09 LAB — GLUCOSE, CAPILLARY: Glucose-Capillary: 101 mg/dL — ABNORMAL HIGH (ref 70–99)

## 2012-06-09 NOTE — Progress Notes (Signed)
Subjective: 1 Day Post-Op Procedure(s) (LRB): TOTAL HIP REVISION (Left) Patient reports pain as moderate.    Objective: Vital signs in last 24 hours: Temp:  [97.4 F (36.3 C)-99.4 F (37.4 C)] 98.8 F (37.1 C) (02/13 0530) Pulse Rate:  [60-98] 74 (02/13 0530) Resp:  [12-18] 18 (02/13 0800) BP: (93-173)/(38-70) 112/47 mmHg (02/13 0808) SpO2:  [94 %-100 %] 100 % (02/13 0214) Weight:  [54.4 kg (119 lb 14.9 oz)] 54.4 kg (119 lb 14.9 oz) (02/12 1700)  Intake/Output from previous day: 02/12 0701 - 02/13 0700 In: 4150 [P.O.:800; I.V.:2750; Blood:350; IV Piggyback:250] Out: 3450 [Urine:2950; Blood:500] Intake/Output this shift:     Recent Labs  06/07/12 1425 06/09/12 0610  HGB 9.2* 9.5*    Recent Labs  06/07/12 1425 06/09/12 0610  WBC 6.3 7.2  RBC 3.34* 3.26*  HCT 28.4* 27.6*  PLT 451* 257    Recent Labs  06/07/12 1425 06/08/12 0953  NA 127* 129*  K 4.5 4.6  CL 90* 90*  CO2 28 28  BUN 12 10  CREATININE 0.50 0.60  GLUCOSE 105* 105*  CALCIUM 9.6 9.9    Recent Labs  06/07/12 1425  INR 1.01    Neurologically intact  Assessment/Plan: 1 Day Post-Op Procedure(s) (LRB): TOTAL HIP REVISION (Left) Up with therapy  OOB to recliner. D/C  KI.   Dressing change  Talibah Colasurdo C 06/09/2012, 10:19 AM

## 2012-06-09 NOTE — Consult Note (Addendum)
WOC consulted for skin breakdown of buttocks. Bedside nurse to assess and contact WOC for further recommendations. WOC not on campus today.  Will await call back from bedside nurse. Nelda Luckey [place] RN,CWOCN [phone]

## 2012-06-09 NOTE — Consult Note (Signed)
WOC consult Note Reason for Consult: eval breakdown of the buttocks. Spoke with bedside nurse, pt has small area on the left buttock that is open. Wound type: Stage II pressure ulcer, partial thickness skin opening Pressure Ulcer POA: Yes Measurement: see nursing documentation flowsheet Wound bed: pink, moist Periwound: intact Dressing procedure/placement/frequency: silicone foam implemented per skin care orders appropriately.  Continue foam dressing for moist wound healing, protection, and insulation.  Turn pt side to side.  Will add chair pressure redistribution pad for chair since she is up out of bed in chair during the day.    Re consult if needed, will not follow at this time. Thanks  Justis Closser [employer], CWOCN [phone])

## 2012-06-09 NOTE — Progress Notes (Signed)
Patient ID: [name], female   DOB: 20-Dec-1933, 79 y.o.   MRN: [id number] Postoperative day one revision hemiarthroplasty of the hip. Labs pending. Patient comfortable this morning. Progressive ambulation with therapy as instructed.

## 2012-06-09 NOTE — Progress Notes (Signed)
UR COMPLETED  

## 2012-06-09 NOTE — Op Note (Signed)
NAMETERIANN, LIVINGOOD                ACCOUNT NO.:  [id number]  MEDICAL RECORD NO.:  [id number]  LOCATION:  [id number]                        FACILITY:  [facility]  PHYSICIAN:  Vlada Uriostegui C. Ophelia Charter, M.D.    DATE OF BIRTH:  Aug 12, 1933  DATE OF PROCEDURE:  06/08/2012 DATE OF DISCHARGE:                              OPERATIVE REPORT   PREOPERATIVE DIAGNOSIS:  Previous left hip hemiarthroplasty with a new periprosthetic proximal femur fracture.  POSTOP DIAGNOSIS:  Previous left hip hemiarthroplasty with a new periprosthetic proximal femur fracture.  PROCEDURE:  Revision of left hip hemiarthroplasty to 12 mm solution stem with open reduction and internal fixation, proximal femur fracture, cable fixation.  SURGEON:  Daouda Lonzo C. Ophelia Charter, M.D.  ASSISTANTS:  Maud Deed, PA-C, medically necessary and present for the entire procedure.  ESTIMATED BLOOD LOSS:  400 mL blood, 1 unit packed cells.  ANESTHESIA:  General plus Marcaine local.  INDICATIONS:  This 79 year old female had previous fixation with press- fit securement of proximal femur fracture involving the femoral neck with a monopolar hemiarthroplasty.  Postoperative x-rays were anatomic. She was ambulatory, was working with physical therapy, and making good progress in ambulation, suddenly started having significant increased pain, great difficulty walking, and x-rays demonstrated a fracture that was spiral extending down to the tip with some subsidence and rotation of the prosthesis without dislocation of the hip.  DESCRIPTION OF PROCEDURE:  After induction of general anesthesia, Ancef prophylaxis, the patient was placed in lateral position, prepping with DuraPrep.  The usual extremity sheets and drapes were applied.  Split sheets, sterile skin marker after removal of the old staples.  DuraPrep had been used and split sheets, Betadine, Steri-Drape x2, impervious stockinette, Coban were all applied.  Time-out procedure was completed. Old incision  was opened.  Extended distally to expose proximal femur. Chandler retractor was placed.  Vastus lateralis was peeled off the vastus ridge and then extended distally on the posterior aspect exposure in the cortex of the femur.  Femur fracture was medial and spiral break starting at the tip primarily involved the medial cortex, and the posterior medial cortex to anterior medial cortex region.  Small spike was noted, it was difficult to reduce using combination of a Dall-Miles cables __________ gradually the fracture was reduced with traction alignment reduction and then tightening down the Dall-Miles cables after total of 4-5 x-rays were taken, finally affecting a good reduction. Cable tightened down and crimped cut and then sequential reaming to 10, 11, 11.5 for 12 Solution Stem Narrow.  It was impacted.  There was very tight fit and it was gradually advanced from the on set of the neck portion of the stem when it was directly at the tip of the greater trochanter.  It was gradually advanced, so it was in the normal position for the calcar.  Calcar was broken off and comminuted after fracture with the patient had been ambulating.  The collar of the prosthesis with rotation had beat up and fragmented the calcar.  One piece of the calcar had been cleaned off.  It was fashioned in once.  The stem was impacted to the correct position with the large hammer with appropriate version. The small  pieces of bone was packed down with the calcar was present. The previous monopolar which was exact size was popped into place, reduced with a bone hook and head pusher.  Sciatic nerve was protected. There was slight difficulty getting the hip reduced, once it was reduced there was good stability.  All cables had been tightened down securely. A total of 3 cables were placed.  X-ray was taken showing good position and alignment.  The split in the vastus was repaired.  Tensor fascia was repaired anatomically.   The patient received 1 unit of packed cells at the end of the case.  A #1 Vicryl was placed in the gluteus maximus fascia and the subcutaneous tissue, and skin staple closure, postop dressing.  Instrument count and needle count was correct.     Myrle Dues C. Ophelia Charter, M.D.     MCY/MEDQ  D:  06/08/2012  T:  06/08/2012  Job:  [id number]

## 2012-06-09 NOTE — Progress Notes (Signed)
CARE MANAGEMENT NOTE 06/09/2012  Patient:  [name], [name]   Account Number:  [id number]  Date Initiated:  06/09/2012  Documentation initiated by:  Vance Peper  Subjective/Objective Assessment:   79 yr old female s/p Left hip hemiarthroplasty revision,with ORIF.     Action/Plan:   Therapy working with patient to determine HH vs SNF. CM will follow   Anticipated DC Date:     Anticipated DC Plan:           Choice offered to / List presented to:             Status of service:  In process, will continue to follow Medicare Important Message given?   (If response is "NO", the following Medicare IM given date fields will be blank) Date Medicare IM given:   Date Additional Medicare IM given:    Discharge Disposition:    Per UR Regulation:    If discussed at Long Length of Stay Meetings, dates discussed:    Comments:

## 2012-06-09 NOTE — Progress Notes (Addendum)
Physical Therapy Treatment Patient Details Name: [name] MRN: [id number] DOB: [birth year] Today's Date: 06/09/2012 Time: 1191-4782 PT Time Calculation (min): 31 min  PT Assessment / Plan / Recommendation Comments on Treatment Session  Pt mobility continues to be limited by inability to adhere to TDWB in LLE. Pt unable to ambulate or pivot secondary to generalized weakness in UEs and RLE and pain in LLE.  Pt resist wearing KI on LLE. High Q angle of LLE places pt at high risk to dislocate the hip. Suggesting use of KI whenever pt is standing or ambulating.     Follow Up Recommendations  SNF     Does the patient have the potential to tolerate intense rehabilitation     Barriers to Discharge        Equipment Recommendations       Recommendations for Other Services    Frequency 7X/week   Plan Frequency remains appropriate;Discharge plan needs to be updated    Precautions / Restrictions Precautions Precautions: Posterior Hip Precaution Booklet Issued: Yes (comment) Precaution Comments: Reviewed all precautions with pt and daughter Required Braces or Orthoses: Knee Immobilizer - Left Knee Immobilizer - Left: Discontinue post op day 2 Restrictions Weight Bearing Restrictions: Yes LLE Weight Bearing: Touchdown weight bearing   Pertinent Vitals/Pain 8/10 pain in hip.  RN notified.    Mobility  Bed Mobility Bed Mobility: Supine to Sit;Sitting - Scoot to Edge of Bed Supine to Sit: 1: +1 Total assist;HOB elevated Sitting - Scoot to Edge of Bed: 2: Max assist Sit to Supine: Not Tested (comment) Details for Bed Mobility Assistance: Assist for bilateral LEs and trunk secondary to L LE pain and generalized weakness.   Transfers Transfers: Sit to Stand;Stand to Sit Sit to Stand: 1: +2 Total assist;From bed;With upper extremity assist Sit to Stand: Patient Percentage: 30% Stand to Sit: 2: Max assist;To chair/3-in-1;With upper extremity assist Stand Pivot Transfers: 1: +2 Total  assist Details for Transfer Assistance: Repeated cueing for TDWB but pt unable to adhere. Pt Presents with genu valgus in L Knee.  L knee position causing IR of the L hip placing pt at risk for dislocation.  Pt unable to advance either LE without total assist of PT.   Ambulation/Gait Ambulation/Gait Assistance: Not tested (comment)    Exercises     PT Diagnosis:    PT Problem List:   PT Treatment Interventions:     PT Goals Acute Rehab PT Goals PT Goal Formulation: With patient Time For Goal Achievement: 06/22/12 Potential to Achieve Goals: Good Pt will go Supine/Side to Sit: with min assist;Independently PT Goal: Supine/Side to Sit - Progress: Progressing toward goal Pt will go Sit to Supine/Side: with min assist;with HOB 0 degrees PT Goal: Sit to Supine/Side - Progress: Progressing toward goal Pt will go Sit to Stand: with min assist PT Goal: Sit to Stand - Progress: Progressing toward goal Pt will go Stand to Sit: with min assist PT Goal: Stand to Sit - Progress: Progressing toward goal Pt will Ambulate: 1 - 15 feet;with min assist;with rolling walker Pt will Perform Home Exercise Program: with supervision, verbal cues required/provided  Visit Information  Last PT Received On: 06/09/12 Assistance Needed: +2    Subjective Data  Subjective: I dont like the knee brace.   Patient Stated Goal: Walk   Cognition  Cognition Overall Cognitive Status: Appears within functional limits for tasks assessed/performed Arousal/Alertness: Awake/alert Orientation Level: Appears intact for tasks assessed Behavior During Session: [facility] for tasks performed  Balance     End of Session PT - End of Session Equipment Utilized During Treatment: Gait belt;Left knee immobilizer Activity Tolerance: Patient tolerated treatment well Patient left: in bed;with call bell/phone within reach;with family/visitor present;with bed alarm set Nurse Communication: Mobility status   GP      [name] 06/09/2012, 2:09 PM [name] L. Yesli Vanderhoff DPT [phone]

## 2012-06-10 LAB — GLUCOSE, CAPILLARY
Glucose-Capillary: 116 mg/dL — ABNORMAL HIGH (ref 70–99)
Glucose-Capillary: 120 mg/dL — ABNORMAL HIGH (ref 70–99)

## 2012-06-10 LAB — CBC
HCT: 27.6 % — ABNORMAL LOW (ref 36.0–46.0)
HCT: 25.9 % — ABNORMAL LOW (ref 36.0–46.0)
Hemoglobin: 9.5 g/dL — ABNORMAL LOW (ref 12.0–15.0)
Hemoglobin: 9 g/dL — ABNORMAL LOW (ref 12.0–15.0)
MCH: 29.1 pg (ref 26.0–34.0)
MCH: 29.2 pg (ref 26.0–34.0)
MCHC: 34.4 g/dL (ref 30.0–36.0)
MCHC: 34.7 g/dL (ref 30.0–36.0)
MCV: 84.1 fL (ref 78.0–100.0)
RBC: 3.26 MIL/uL — ABNORMAL LOW (ref 3.87–5.11)
RBC: 3.08 MIL/uL — ABNORMAL LOW (ref 3.87–5.11)

## 2012-06-10 NOTE — Evaluation (Signed)
Occupational Therapy Evaluation Patient Details Name: [name] MRN: [id number] DOB: 06-20-1933 Today's Date: 06/10/2012 Time: 1429-1500 OT Time Calculation (min): 31 min  OT Assessment / Plan / Recommendation Clinical Impression  This 79 yo female s/p Revision of left hip hemiarthroplasty due to periprosthetic proximal femur fracture presents to acute OT with problems below. Will benefit from acute OT with follow up at SNF.    OT Assessment  Patient needs continued OT Services    Follow Up Recommendations  SNF    Barriers to Discharge None    Equipment Recommendations  None recommended by OT       Frequency  Min 2X/week    Precautions / Restrictions Precautions Precautions: Posterior Hip Precaution Booklet Issued: Yes (comment) Precaution Comments: Reviewed all precautions with pt and daughter Required Braces or Orthoses: Knee Immobilizer - Left Knee Immobilizer - Left: Discontinue post op day 2 Restrictions Weight Bearing Restrictions: Yes LLE Weight Bearing: Touchdown weight bearing       ADL  Eating/Feeding: Simulated;Independent Where Assessed - Eating/Feeding: Chair Grooming: Simulated;Set up Where Assessed - Grooming: Supported sitting Upper Body Bathing: Simulated;Set up Where Assessed - Upper Body Bathing: Supported sitting Lower Body Bathing: Simulated;Maximal assistance Where Assessed - Lower Body Bathing: Supported sit to stand Upper Body Dressing: Simulated;Minimal assistance Where Assessed - Upper Body Dressing: Supported sitting Lower Body Dressing: Simulated;+1 Total assistance Where Assessed - Lower Body Dressing: Supported sit to stand Equipment Used: Rolling walker;Gait belt Transfers/Ambulation Related to ADLs: Pt min guard A to scoot forward in recliner with increased time, Mod A to scoot back in recliner, Min A sit to stand from recliner; mod A stand to sit (decreased control of descent), unable to ambulate at this time due to cannot maintain  TDWB    OT Diagnosis: Generalized weakness  OT Problem List: Decreased strength;Decreased activity tolerance;Impaired balance (sitting and/or standing);Decreased knowledge of precautions;Decreased knowledge of use of DME or AE OT Treatment Interventions: Self-care/ADL training;DME and/or AE instruction;Therapeutic activities;Therapeutic exercise;Patient/family education;Balance training   OT Goals Acute Rehab OT Goals OT Goal Formulation: With patient Time For Goal Achievement: 06/17/12 Potential to Achieve Goals: Good ADL Goals Pt Will Perform Lower Body Dressing: with min assist;Sit to stand from chair;Sit to stand from bed;Supported;with adaptive equipment ADL Goal: Lower Body Dressing - Progress: Goal set today Pt Will Transfer to Toilet: with min assist;Stand pivot transfer;3-in-1 ADL Goal: Toilet Transfer - Progress: Goal set today Arm Goals Pt Will Complete Theraband Exer: with supervision, verbal cues required/provided;Level 2 Theraband;1 set;10 reps;to increase strength;Bilateral upper extremities Arm Goal: Theraband Exercises - Progress: Goal set today Miscellaneous OT Goals Miscellaneous OT Goal #1: Pt will be able to maintain standing at RW with min A to help with BADLs. OT Goal: Miscellaneous Goal #1 - Progress: Goal set today Miscellaneous OT Goal #2: Pt will be Min A to get OOB in prep for transfers OT Goal: Miscellaneous Goal #2 - Progress: Goal set today  Visit Information  Last OT Received On: 06/10/12 Assistance Needed: +2 (for attempt at ambulation/hopping)       Prior Functioning     Home Living Lives With: Son;Daughter;Friend(s) Available Help at Discharge: Family;Friend(s);Available 24 hours/day Type of Home: House Home Access: Level entry Home Layout: Two level;Able to live on main level with bedroom/bathroom;Laundry or work area in basement [employer] Shower/Tub: Tub/shower unit (sponge baths) Firefighter: Standard Home Adaptive Equipment: Environmental consultant -  rolling;Straight cane;Bedside commode/3-in-1;Shower chair with back Prior Function Level of Independence: Independent with assistive device(s) Able to Take  Stairs?: Yes Driving: No Vocation: Retired Musician: No difficulties Dominant Hand: Right         Vision/Perception Vision - History Baseline Vision: Wears glasses all the time   Cognition  Cognition Overall Cognitive Status: Appears within functional limits for tasks assessed/performed Arousal/Alertness: Awake/alert Orientation Level: Appears intact for tasks assessed Behavior During Session: [facility] for tasks performed    Extremity/Trunk Assessment Right Upper Extremity Assessment RUE ROM/Strength/Tone:  (grossly 3/5) Left Upper Extremity Assessment LUE ROM/Strength/Tone:  (grossly 3/5)     Mobility Bed Mobility Bed Mobility: Not assessed Details for Bed Mobility Assistance: Pt up in recliner upon my arrival Transfers Transfers: Sit to Stand;Stand to Sit Sit to Stand: 4: Min assist;With upper extremity assist;With armrests;From chair/3-in-1 Sit to Stand: Patient Percentage: 70% Stand to Sit: 3: Mod assist;With upper extremity assist;With armrests;To bed (decreased control for descent) Details for Transfer Assistance: VCs for safe hand placement     Exercise Total Joint Exercises Quad Sets: AROM;10 reps;Both Other Exercises Other Exercises: Gave pt 2 pieces of level 2 theraband tied to the handles on the recliner for her to do Bil UE exercises (tricep extension)---10 times every hour that she is awake and in the recliner (pt S level)      End of Session OT - End of Session Equipment Utilized During Treatment: Gait belt Activity Tolerance: Patient tolerated treatment well Patient left: in chair;with call bell/phone within reach;with family/visitor present       Evette Georges [phone] 06/10/2012, 3:24 PM

## 2012-06-10 NOTE — Progress Notes (Signed)
Clinical Social Work Department  BRIEF PSYCHOSOCIAL ASSESSMENT  Patient: [name]  Account Number: [id number]  Admit date: 06/08/12 Clinical Social Worker Sabino Niemann, MSW Date/Time: 06/10/2012 4:00 PM Referred by: Physician Date Referred: 06/08/12 Referred for   SNF Placement   Other Referral:  Interview type: Patient  And daughter over the phone Other interview type:PSYCHOSOCIAL DATA  Living Status: FAMILY- daughter Admitted from facility:  Level of care:  Primary support name: Williams,Rochelle   Primary support relationship to patient: daughter Degree of support available:  fair  CURRENT CONCERNS  Current Concerns   Post-Acute Placement   Other Concerns:  SOCIAL WORK ASSESSMENT / PLAN  CSW met with pt re: PT recommendation for SNF.   Pt lives in [place]  CSW explained placement process and answered questions.   Pt reports requested CSW speak with daughter about placement. SW left msg for daughter   CSW completed FL2 and initiatedSNF search.     Assessment/plan status: Information/Referral to [facility]  Other assessment/ plan:  Information/referral to community resources:  SNF   PTAR   PATIENT'S/FAMILY'S RESPONSE TO PLAN OF CARE:  Pt  reports she is agreeable to [facility] SNF in order to increase strength and independence with mobility prior to return home  Pt verbalized understanding of placement process and appreciation for CSW assist.   Sabino Niemann, MSW [phone]

## 2012-06-10 NOTE — Progress Notes (Addendum)
Subjective: 2 Days Post-Op Procedure(s) (LRB): TOTAL HIP REVISION (Left) Patient reports pain as mild.    Objective: Vital signs in last 24 hours: Temp:  [97.6 F (36.4 C)-102.3 F (39.1 C)] 98.7 F (37.1 C) (02/14 0458) Pulse Rate:  [77-106] 81 (02/14 0458) Resp:  [16-18] 16 (02/14 0800) BP: (109-127)/(40-47) 114/45 mmHg (02/14 0458) SpO2:  [91 %-95 %] 95 % (02/14 0800)  Intake/Output from previous day: 02/13 0701 - 02/14 0700 In: -  Out: 400 [Urine:400] Intake/Output this shift: Total I/O In: -  Out: 300 [Urine:300]   Recent Labs  06/07/12 1425 06/09/12 0610 06/10/12 0535  HGB 9.2* 9.5* 9.0*    Recent Labs  06/09/12 0610 06/10/12 0535  WBC 7.2 10.3  RBC 3.26* 3.08*  HCT 27.6* 25.9*  PLT 257 217    Recent Labs  06/07/12 1425 06/08/12 0953  NA 127* 129*  K 4.5 4.6  CL 90* 90*  CO2 28 28  BUN 12 10  CREATININE 0.50 0.60  GLUCOSE 105* 105*  CALCIUM 9.6 9.9    Recent Labs  06/07/12 1425  INR 1.01    Neurologically intact  Assessment/Plan: 2 Days Post-Op Procedure(s) (LRB): TOTAL HIP REVISION (Left) Up with therapy Plan Home Saturday.    Office Dr Ophelia Charter 1 to 2 wks  Nikolus Marczak C 06/10/2012, 12:56 PM   Rx for norco 5/325  On chart for pain.   Discharge Saturday.   ASA for DVT prophylaxis

## 2012-06-10 NOTE — Progress Notes (Signed)
Physical Therapy Treatment Patient Details Name: [name] MRN: [id number] DOB: October 14, 1933 Today's Date: 06/10/2012 Time: 5284-1324 PT Time Calculation (min): 16 min  PT Assessment / Plan / Recommendation Comments on Treatment Session  Pt mobility continues to be significantly limited by pain in L HIp and generalized weakness.  Spoke with family after session regarding pt d/c plan.  Suggesting SNF placement due to pt need for skilled assistance for transfer to prevent further injury of L hip.  Pt and family agree with plan for d/c to SNF.    Follow Up Recommendations  SNF     Does the patient have the potential to tolerate intense rehabilitation     Barriers to Discharge        Equipment Recommendations  None recommended by PT    Recommendations for Other Services    Frequency 7X/week   Plan Frequency remains appropriate;Discharge plan needs to be updated    Precautions / Restrictions Precautions Precautions: Posterior Hip Precaution Booklet Issued: Yes (comment) Precaution Comments: Reviewed all precautions with pt and daughter Required Braces or Orthoses: Knee Immobilizer - Left Knee Immobilizer - Left: Discontinue post op day 2 Restrictions Weight Bearing Restrictions: Yes LLE Weight Bearing: Touchdown weight bearing   Pertinent Vitals/Pain  Pt reporting pain in hip 4/10 Pt medicated prior to session.    2 Mobility  Bed Mobility Bed Mobility: Not assessed Transfers Transfers: Sit to Stand;Stand Pivot Transfers;Stand to Sit Sit to Stand: 3: Mod assist;From chair/3-in-1;With upper extremity assist Sit to Stand: Patient Percentage: 70% Stand to Sit: 3: Mod assist;To chair/3-in-1;With upper extremity assist Stand Pivot Transfers: 1: +2 Total assist Stand Pivot Transfers: Patient Percentage: 20% Details for Transfer Assistance: Pt continues to require +2 assist to stand pivot transfer.  Pt unable to support her wt with her UEs while pivoting her R LE.    Ambulation/Gait Ambulation/Gait Assistance: 1: +2 Total assist Ambulation Distance (Feet): 5 Feet (5 feet x 3 trials. ) Assistive device: Rolling walker Ambulation/Gait Assistance Details: attempted first trial with no KI.  Pt sliding R foot and excessively WB on LLE. Pt unable to fully extend  RLE in standing.  Next 2 trials pt wore KI with improved ability to advnace bilateral LEs  Gait Pattern: Step-to pattern;Decreased stride length;Trunk flexed;Decreased step length - right;Decreased step length - left Gait velocity: decreased Stairs: No    Exercises Total Joint Exercises Quad Sets: AROM;10 reps;Both   PT Diagnosis:    PT Problem List:   PT Treatment Interventions:     PT Goals Acute Rehab PT Goals PT Goal Formulation: With patient Time For Goal Achievement: 06/22/12 Potential to Achieve Goals: Good Pt will go Supine/Side to Sit: with min assist;Independently Pt will go Sit to Supine/Side: with min assist;with HOB 0 degrees Pt will go Sit to Stand: with min assist PT Goal: Sit to Stand - Progress: Progressing toward goal Pt will go Stand to Sit: with min assist PT Goal: Stand to Sit - Progress: Progressing toward goal Pt will Ambulate: 1 - 15 feet;with min assist;with rolling walker PT Goal: Ambulate - Progress: Progressing toward goal Pt will Perform Home Exercise Program: with supervision, verbal cues required/provided PT Goal: Perform Home Exercise Program - Progress: Progressing toward goal  Visit Information  Last PT Received On: 06/10/12 Assistance Needed: +2    Subjective Data  Subjective: Am I doing any better?  Patient Stated Goal: Walk   Cognition  Cognition Overall Cognitive Status: Appears within functional limits for tasks assessed/performed Arousal/Alertness: Awake/alert Orientation  Level: Appears intact for tasks assessed Behavior During Session: [facility] for tasks performed    Balance  Balance Balance Assessed: Yes Static Standing Balance Static  Standing - Balance Support: Bilateral upper extremity supported Static Standing - Level of Assistance: 3: Mod assist Static Standing - Comment/# of Minutes: Pt presenting with posterior lean in standing.  Unable to correct without assistance.    End of Session PT - End of Session Equipment Utilized During Treatment: Gait belt;Left knee immobilizer Activity Tolerance: Patient tolerated treatment well Patient left: in bed;with call bell/phone within reach;with family/visitor present;with bed alarm set Nurse Communication: Mobility status   GP     Leeann Bady 06/10/2012, 3:11 PM Kayveon Lennartz L. Jakylan Ron DPT [phone]

## 2012-06-10 NOTE — Clinical Social Work Placement (Addendum)
Clinical Social Work Department  CLINICAL SOCIAL WORK PLACEMENT NOTE  06/10/2012 Patient: [name] Account Number: [id number]  Admit date: 06/08/12 Clinical Social Worker: Sabino Niemann MSW Date/time: 06/10/2012 4:30 AM  Clinical Social Work is seeking post-discharge placement for this patient at the following level of care: SKILLED NURSING (*CSW will update this form in Epic as items are completed)  06/10/2012 Patient/family provided with [facility] Department of Clinical Social Work's list of facilities offering this level of care within the geographic area requested by the patient (or if unable, by the patient's family). 06/10/2012 Patient/family informed of their freedom to choose among providers that offer the needed level of care, that participate in Medicare, Medicaid or managed care program needed by the patient, have an available bed and are willing to accept the patient.  06/10/2012 Patient/family informed of [facility]' ownership interest in [facility], as well as of the fact that they are under no obligation to receive care at this facility.  PASARR submitted to EDS on 06/10/2012 PASARR number received from EDS on 06/10/2012  FL2 transmitted to all facilities in geographic area requested by pt/family on 06/10/2012-  To [place] and [facility] FL2 transmitted to all facilities within larger geographic area on  Patient informed that his/her managed care company has contracts with or will negotiate with certain facilities, including the following:  Patient/family informed of bed offers received:  Patient chooses bed at [facility] in [place] Physician recommends and patient chooses bed at  Patient to be transferred to on 06/11/12 Patient to be transferred to facility by Unknown-weekend facilitated d/c The following physician request were entered in Epic:  Additional Comments:  Sabino Niemann, MSW [phone] Signing off CSW left msg's for daughter's about placement in [place] or  [place]. SW will f/u with their preference.

## 2012-06-10 NOTE — Progress Notes (Signed)
CARE MANAGEMENT NOTE 06/10/2012  Patient:  [name], [name]   Account Number:  [id number]  Date Initiated:  06/09/2012  Documentation initiated by:  Vance Peper  Subjective/Objective Assessment:   79 yr old female s/p Left hip hemiarthroplasty revision,with ORIF.     Action/Plan:   Therapy working with patient to determine HH vs SNF. CM will follow  06/10/12 Per physical therapist patient will need rehab at [facility].Social Worker is aware.   Anticipated DC Date:  06/11/2012   Anticipated DC Plan:  SKILLED NURSING FACILITY  In-house referral  Clinical Social Worker      DC Planning Services  CM consult      [facility] Choice  NA   Choice offered to / List presented to:          [facility] arranged  NA      Status of service:  Completed, signed off Medicare Important Message given?   (If response is "NO", the following Medicare IM given date fields will be blank) Date Medicare IM given:   Date Additional Medicare IM given:    Discharge Disposition:  SKILLED NURSING FACILITY  Per UR Regulation:    If discussed at Long Length of Stay Meetings, dates discussed:    Comments:

## 2012-06-11 LAB — TYPE AND SCREEN: Unit division: 0

## 2012-06-11 LAB — CBC
MCH: 28.8 pg (ref 26.0–34.0)
Platelets: 194 10*3/uL (ref 150–400)
RBC: 3.06 MIL/uL — ABNORMAL LOW (ref 3.87–5.11)
RDW: 13.8 % (ref 11.5–15.5)
WBC: 9.8 10*3/uL (ref 4.0–10.5)

## 2012-06-11 MED ORDER — SENNOSIDES-DOCUSATE SODIUM 8.6-50 MG PO TABS: 1.0000 | ORAL_TABLET | Freq: Every evening | ORAL | Status: DC | PRN

## 2012-06-11 MED ORDER — ASPIRIN 325 MG PO TBEC: 325.0000 mg | DELAYED_RELEASE_TABLET | Freq: Every day | ORAL | Status: DC

## 2012-06-11 MED ORDER — DSS 100 MG PO CAPS: 100.0000 mg | ORAL_CAPSULE | Freq: Two times a day (BID) | ORAL | Status: DC

## 2012-06-11 MED ORDER — BISACODYL 10 MG RE SUPP: 10.0000 mg | Freq: Every day | RECTAL | Status: DC | PRN

## 2012-06-11 MED ORDER — HYDROCODONE-ACETAMINOPHEN 5-325 MG PO TABS: 1.0000 | ORAL_TABLET | ORAL | Status: DC | PRN

## 2012-06-11 NOTE — Progress Notes (Signed)
CSW called [place] [facility], [place] - [phone] and spoke to Zella Ball, admissions coordinator- no beds are available until at least Monday.  CSW called [facility] at [phone] - no answer, left message for a return call.  Receptionist stated they do not receive patients on the w/e other than those that have been arranged prior to the w/e.  CSW left message with admissions coordinator.  CSW called [facility] and spoke with Georgia Duff, Admission Director.  Lurena Joiner offered a bed to the pt.  Lurena Joiner stated she would just need the d/c instructions sent via [facility] along with any narcotics that the pt may be d/c on.  Lurena Joiner stated she would be ready for the pt around 4pm.  Her cell # is [phone].  CSW will relay bed offer to pt and offer her this option.  Per pt daughter, Misty Stanley, pt son, Casimiro Needle will be the one to transport pt to facility today and he is on his way here to [facility].  CSW will f/u with pt and pt son re: d/c plans.  Vickii Penna, LCSWA [phone]  Clinical Social Work

## 2012-06-11 NOTE — Discharge Summary (Signed)
Connie Campbell, MD   Connie Code, PA-C [address] [place], [place]  [address]                             [phone]  PATIENT ID: [name]        MRN:  [id number]          DOB/AGE: [birth year] / 79 y.o.    DISCHARGE SUMMARY  ADMISSION DATE:    06/08/2012 DISCHARGE DATE:   06/11/2012   ADMISSION DIAGNOSIS: Left femur periprosthetic fracture    DISCHARGE DIAGNOSIS:  Left femur periprosthetic fracture    ADDITIONAL DIAGNOSIS: Principal Problem:   Femur fracture, left  Past Medical History  Diagnosis Date  . Heart murmur   . Bell's palsy   . Arthritis   . Hypertension     takes Lisinopril and Amlodipine daily  . Diabetes mellitus without complication     type 2;takes Metformin daily  . Depression     takes Xanax and Citalopram daily  . Anemia     takes Ferrous Gluconate daily  . Left bundle branch block     PROCEDURE: Procedure(s): TOTAL HIP REVISION Lefton 06/08/2012  CONSULTS: none     HISTORY: Pt is s/p left hip hemiarthroplasty on 05/20/12. Post op radiographs showed good position of the prosthesis. Pt was discharged to home and noticed increase pain with ambulation. Radiographs at office visit 06/02/12 showed fracture of the left hip calcar and subsidence of the prosthesis. Pt will require surgical intervention for revsion of left hip hemiarthroplasty.  Pt was on coumadin for VTE prophylaxis and this was stopped on 06/03/12. Pt started on Vitamin K 5mg  daily for 3 days in anticipation    HOSPITAL COURSE:  [name] is a 79 y.o. admitted on 06/08/2012 and found to have a diagnosis of Left femur periprosthetic fracture.  After appropriate laboratory studies were obtained  they were taken to the operating room on 06/08/2012 and underwent  Procedure(s): TOTAL HIP REVISION Left.   They were given perioperative antibiotics:  Anti-infectives   Start     Dose/Rate Route Frequency Ordered Stop   06/08/12 1900  ceFAZolin (ANCEF) IVPB 1 g/50 mL premix     1 g 100  mL/hr over 30 Minutes Intravenous Every 6 hours 06/08/12 1806 06/09/12 0130   06/08/12 0600  ceFAZolin (ANCEF) IVPB 2 g/50 mL premix     2 g 100 mL/hr over 30 Minutes Intravenous On call to O.R. 06/07/12 1920 06/08/12 1246    .  Tolerated the procedure well.   POD #1, allowed out of bed to a chair.  PT for ambulation and exercise program TDWB.  POD #2, continued PT and ambulation.   . The remainder of the hospital course was dedicated to ambulation and strengthening.   The patient was discharged on 3 Days Post-Op in  Stable condition.  Blood products given:2 units CC PRBC  DIAGNOSTIC STUDIES: Recent vital signs:  Patient Vitals for the past 24 hrs:  BP Temp Pulse Resp SpO2  06/11/12 1034 138/44 mmHg - 65 - -  06/11/12 0635 125/56 mmHg 98.6 F (37 C) 82 18 99 %  06/10/12 2100 120/46 mmHg 98.9 F (37.2 C) 84 18 95 %  06/10/12 1300 126/45 mmHg 100 F (37.8 C) 94 18 90 %       Recent laboratory studies:  Recent Labs  06/07/12 1425 06/09/12 0610 06/10/12 0535 06/11/12 0651  WBC 6.3 7.2 10.3 9.8  HGB 9.2* 9.5* 9.0* 8.8*  HCT 28.4* 27.6* 25.9* 25.9*  PLT 451* 257 217 194    Recent Labs  06/07/12 1425 06/08/12 0953  NA 127* 129*  K 4.5 4.6  CL 90* 90*  CO2 28 28  BUN 12 10  CREATININE 0.50 0.60  GLUCOSE 105* 105*  CALCIUM 9.6 9.9   Lab Results  Component Value Date   INR 1.01 06/07/2012   INR 1.37 05/25/2012   INR 1.20 05/24/2012     Recent Radiographic Studies :  Dg Chest 2 View  06/07/2012  *RADIOLOGY REPORT*  Clinical Data: Preop for left hip replacement  CHEST - 2 VIEW  Comparison: 05/19/2012  Findings: Cardiomediastinal silhouette is stable.  Mild thoracic dextroscoliosis.  Mild degenerative changes thoracic spine.  No acute infiltrate or pulmonary edema.  IMPRESSION: No active disease.  Mild thoracic dextroscoliosis.  Borderline cardiomegaly again noted.   Original Report Authenticated By: Natasha Mead, M.D.    Dg Hip Operative Left  06/08/2012   **ADDENDUM** CREATED: 06/08/2012 15:55:20  According to the radiology technologist Inetta Fermo) who took the images, this is the patient's left side although marked right.  This will be corrected in PACs.  **END ADDENDUM** SIGNED BY: Almedia Balls. Constance Goltz, M.D.   06/08/2012  *RADIOLOGY REPORT*  Clinical Data: Left periprosthetic fracture.  OPERATIVE LEFT HIP  Comparison: 05/20/2012  Findings: Six intraoperative views submitted for review after surgery.  Films are marked as if this is the right-side.  Fracture of the proximal femur.  Cerclage wires in place.  Long stem total hip prosthesis placed.  The distal aspect is not visualized on the present exam.  IMPRESSION: Six intraoperative views submitted for review after surgery.  Films are marked as if this is the right-side.  Fracture of the proximal femur.  Cerclage wires in place.  Long stem total hip prosthesis placed.  The distal aspect is not visualized on the present exam.  Confirmation of side and evaluation of the lower aspect of the prosthesis can be confirmed on follow-up imaging.  This is a call report French Ana to call)   Original Report Authenticated By: Lacy Duverney, M.D.    Dg Pelvis Portable  05/20/2012  *RADIOLOGY REPORT*  Clinical Data: Postop left hip replacement  PORTABLE PELVIS  Comparison: Preoperative films of 05/19/2012  Findings: A left hip hemiarthroplasty has been performed.  No complicating features are evident.  The femoral stem appears to be in good position. No abnormality of the right hip is seen.  There is degenerative change noted in the lower lumbar spine.  IMPRESSION: Left hip hemiarthroplasty is now present with no complicating features.   Original Report Authenticated By: Dwyane Dee, M.D.     DISCHARGE INSTRUCTIONS:     Discharge Orders   Future Orders Complete By Expires     Call MD / Call 911  As directed     Comments:      If you experience chest pain or shortness of breath, CALL 911 and be transported to the hospital emergency  room.  If you develope a fever above 101 F, pus (white drainage) or increased drainage or redness at the wound, or calf pain, call your surgeon's office.    Change dressing  As directed     Comments:      You may change your dressing on Monday, then change the dressing daily with sterile 4 x 4 inch gauze dressing and paper tape.  You may clean the incision with alcohol prior to redressing  Constipation Prevention  As directed     Comments:      Drink plenty of fluids.  Prune juice and/or coffee may be helpful.  You may use a stool softener, such as Colace (over the counter) 100 mg twice a day.  Use MiraLax (over the counter) for constipation as needed but this may take several days to work.  Mag Citrate --OR-- Milk of Magnesia may also be used but follow directions on the label.    Diet general  As directed     Driving restrictions  As directed     Comments:      No driving for 6 weeks    Follow the hip precautions as taught in Physical Therapy  As directed     Increase activity slowly as tolerated  As directed     Lifting restrictions  As directed     Comments:      No lifting for 6 weeks    Patient may shower  As directed     Comments:      You may shower over the brown dressing.  Once the dressing is removed you may shower without a dressing once there is no drainage.  Do not wash over the wound.  If drainage remains, cover wound with plastic wrap and then shower.    Touch down weight bearing  As directed        DISCHARGE MEDICATIONS:     Medication List    STOP taking these medications       HYDROcodone-acetaminophen 5-500 MG per tablet  Commonly known as:  VICODIN  Replaced by:  HYDROcodone-acetaminophen 5-325 MG per tablet     meloxicam 15 MG tablet  Commonly known as:  MOBIC      TAKE these medications       ALPRAZolam 0.5 MG tablet  Commonly known as:  XANAX  Take 0.5 mg by mouth at bedtime as needed for sleep or anxiety.     amLODipine 5 MG tablet  Commonly  known as:  NORVASC  Take 5 mg by mouth daily.     aspirin 325 MG EC tablet  Take 1 tablet (325 mg total) by mouth daily with breakfast.     bisacodyl 10 MG suppository  Commonly known as:  DULCOLAX  Place 1 suppository (10 mg total) rectally daily as needed.     citalopram 10 MG tablet  Commonly known as:  CELEXA  Take 10 mg by mouth daily.     DSS 100 MG Caps  Take 100 mg by mouth 2 (two) times daily.     ferrous gluconate 325 MG tablet  Commonly known as:  FERGON  Take 325 mg by mouth 2 (two) times daily.     HYDROcodone-acetaminophen 5-325 MG per tablet  Commonly known as:  NORCO/VICODIN  Take 1 tablet by mouth every 4 (four) hours as needed for pain.     lisinopril 20 MG tablet  Commonly known as:  PRINIVIL,ZESTRIL  Take 20 mg by mouth daily.     loxapine 10 MG capsule  Commonly known as:  LOXITANE  Take 20 mg by mouth at bedtime.     metFORMIN 500 MG 24 hr tablet  Commonly known as:  GLUCOPHAGE-XR  Take 500 mg by mouth daily with breakfast.     senna-docusate 8.6-50 MG per tablet  Commonly known as:  Senokot-S  Take 1 tablet by mouth at bedtime as needed.        FOLLOW UP VISIT:  Follow-up Information   Follow up with Eldred Manges, MD. Schedule an appointment as soon as possible for a visit today. (1-2 weeks)    Contact information:   [address] [place] [address] [phone]       DISPOSITION:   Skilled Nursing Facility/Rehab  CONDITION:  Stable   Landy Dunnavant 06/11/2012, 11:16 AM

## 2012-06-11 NOTE — Progress Notes (Addendum)
12:44pm- CSW received confirmation that information has been received.  Lurena Joiner at [facility] stated that they would not have her meds available until around 4-5pm so if pt was d/c prior to that time, pt would need to have all required medications.  CSW advised MD.  CSW uploaded all d/c information to [facility] and sent to [facility] for [employer] coordinator Lurena Joiner) to review.  As per request, CSW text Lurena Joiner to let her know this information has been uploaded.  CSW will await a confirmation of receipt.  CSW will continue to assist.  Vickii Penna, LCSWA [phone]  Clinical Social Work

## 2012-06-11 NOTE — Progress Notes (Signed)
Patient ID: [name], female   DOB: Sep 23, 1933, 79 y.o.   MRN: [id number] PATIENT ID: [name]        MRN:  [id number]          DOB/AGE: 06-06-1933 / 79 y.o.    Connie Campbell, MD   [name], PA-C [address] [place], [place]  [address]                             [phone]   PROGRESS NOTE  Subjective:  negative for Chest Pain  negative for Shortness of Breath  negative for Nausea/Vomiting   negative for Calf Pain    Tolerating Diet: yes         Patient reports pain as mild and moderate.     Waiting for SNF acceptance  Objective: Vital signs in last 24 hours:   Patient Vitals for the past 24 hrs:  BP Temp Pulse Resp SpO2  06/11/12 1034 138/44 mmHg - 65 - -  06/11/12 0635 125/56 mmHg 98.6 F (37 C) 82 18 99 %  06/10/12 2100 120/46 mmHg 98.9 F (37.2 C) 84 18 95 %  06/10/12 1300 126/45 mmHg 100 F (37.8 C) 94 18 90 %      Intake/Output from previous day:   02/14 0701 - 02/15 0700 In: 480 [P.O.:480] Out: 300 [Urine:300]   Intake/Output this shift:   02/15 0701 - 02/15 1900 In: 240 [P.O.:240] Out: -    Intake/Output     02/14 0701 - 02/15 0700 02/15 0701 - 02/16 0700   P.O. 480 240   Total Intake(mL/kg) 480 (8.8) 240 (4.4)   Urine (mL/kg/hr) 300 (0.2)    Total Output 300     Net +180 +240        Urine Occurrence  1 x      LABORATORY DATA:  Recent Labs  06/07/12 1425 06/09/12 0610 06/10/12 0535 06/11/12 0651  WBC 6.3 7.2 10.3 9.8  HGB 9.2* 9.5* 9.0* 8.8*  HCT 28.4* 27.6* 25.9* 25.9*  PLT 451* 257 217 194    Recent Labs  06/07/12 1425 06/08/12 0953  NA 127* 129*  K 4.5 4.6  CL 90* 90*  CO2 28 28  BUN 12 10  CREATININE 0.50 0.60  GLUCOSE 105* 105*  CALCIUM 9.6 9.9   Lab Results  Component Value Date   INR 1.01 06/07/2012   INR 1.37 05/25/2012   INR 1.20 05/24/2012    Recent Radiographic Studies :  Dg Chest 2 View  06/07/2012  *RADIOLOGY REPORT*  Clinical Data: Preop for left hip replacement  CHEST - 2 VIEW  Comparison:  05/19/2012  Findings: Cardiomediastinal silhouette is stable.  Mild thoracic dextroscoliosis.  Mild degenerative changes thoracic spine.  No acute infiltrate or pulmonary edema.  IMPRESSION: No active disease.  Mild thoracic dextroscoliosis.  Borderline cardiomegaly again noted.   Original Report Authenticated By: Natasha Mead, M.D.    Dg Hip Operative Left  06/08/2012  **ADDENDUM** CREATED: 06/08/2012 15:55:20  According to the radiology technologist Inetta Fermo) who took the images, this is the patient's left side although marked right.  This will be corrected in PACs.  **END ADDENDUM** SIGNED BY: Almedia Balls. Constance Goltz, M.D.   06/08/2012  *RADIOLOGY REPORT*  Clinical Data: Left periprosthetic fracture.  OPERATIVE LEFT HIP  Comparison: 05/20/2012  Findings: Six intraoperative views submitted for review after surgery.  Films are marked as if this is the right-side.  Fracture of the  proximal femur.  Cerclage wires in place.  Long stem total hip prosthesis placed.  The distal aspect is not visualized on the present exam.  IMPRESSION: Six intraoperative views submitted for review after surgery.  Films are marked as if this is the right-side.  Fracture of the proximal femur.  Cerclage wires in place.  Long stem total hip prosthesis placed.  The distal aspect is not visualized on the present exam.  Confirmation of side and evaluation of the lower aspect of the prosthesis can be confirmed on follow-up imaging.  This is a call report French Ana to call)   Original Report Authenticated By: Lacy Duverney, M.D.    Dg Pelvis Portable  05/20/2012  *RADIOLOGY REPORT*  Clinical Data: Postop left hip replacement  PORTABLE PELVIS  Comparison: Preoperative films of 05/19/2012  Findings: A left hip hemiarthroplasty has been performed.  No complicating features are evident.  The femoral stem appears to be in good position. No abnormality of the right hip is seen.  There is degenerative change noted in the lower lumbar spine.  IMPRESSION: Left hip  hemiarthroplasty is now present with no complicating features.   Original Report Authenticated By: Dwyane Dee, M.D.      Examination:  General appearance: alert, cooperative, mild distress and moderate distress  Wound Exam: clean, dry, intact   Drainage:  None: wound tissue dry  Motor Exam: EHL, FHL, Anterior Tibial and Posterior Tibial Intact  Sensory Exam: Superficial Peroneal, Deep Peroneal and Tibial normal  Vascular Exam: Left dorsalis pedis artery has trace pulse  Assessment:    3 Days Post-Op  Procedure(s) (LRB): TOTAL HIP REVISION (Left)  ADDITIONAL DIAGNOSIS:  Principal Problem:   Femur fracture, left  Acute Blood Loss Anemia   Plan: Physical Therapy as ordered Touch Down Weight Bearing (TDWB)  DVT Prophylaxis:  Aspirin  DISCHARGE PLAN: Skilled Nursing Facility/Rehab  DISCHARGE NEEDS: [name] 06/11/2012 10:46 AM

## 2012-06-11 NOTE — Progress Notes (Signed)
Patient was given copy of chart,etc. And taken by son via w/c to SNF without staff knowledge. Patient was in stable condition.

## 2012-06-13 ENCOUNTER — Encounter (HOSPITAL_COMMUNITY): Payer: Self-pay | Admitting: Orthopaedic Surgery

## 2017-03-15 ENCOUNTER — Inpatient Hospital Stay (HOSPITAL_COMMUNITY)
Admission: AD | Admit: 2017-03-15 | Discharge: 2017-03-21 | DRG: 466 | Disposition: A | Discharge status: Skilled Nursing Facility | Payer: Medicare Other | Source: Other Acute Inpatient Hospital | Attending: Family Medicine | Admitting: Family Medicine

## 2017-03-15 ENCOUNTER — Inpatient Hospital Stay (HOSPITAL_COMMUNITY): Payer: Medicare Other

## 2017-03-15 ENCOUNTER — Other Ambulatory Visit: Payer: Self-pay

## 2017-03-15 ENCOUNTER — Encounter (HOSPITAL_COMMUNITY): Payer: Self-pay | Admitting: Internal Medicine

## 2017-03-15 DIAGNOSIS — F329 Major depressive disorder, single episode, unspecified: Secondary | ICD-10-CM | POA: Diagnosis present

## 2017-03-15 DIAGNOSIS — Y792 Prosthetic and other implants, materials and accessory orthopedic devices associated with adverse incidents: Secondary | ICD-10-CM | POA: Diagnosis present

## 2017-03-15 DIAGNOSIS — S72002K Fracture of unspecified part of neck of left femur, subsequent encounter for closed fracture with nonunion: Secondary | ICD-10-CM

## 2017-03-15 DIAGNOSIS — S7290XA Unspecified fracture of unspecified femur, initial encounter for closed fracture: Secondary | ICD-10-CM | POA: Diagnosis not present

## 2017-03-15 DIAGNOSIS — M1990 Unspecified osteoarthritis, unspecified site: Secondary | ICD-10-CM | POA: Diagnosis present

## 2017-03-15 DIAGNOSIS — R296 Repeated falls: Secondary | ICD-10-CM | POA: Diagnosis present

## 2017-03-15 DIAGNOSIS — E119 Type 2 diabetes mellitus without complications: Secondary | ICD-10-CM | POA: Diagnosis present

## 2017-03-15 DIAGNOSIS — W1830XA Fall on same level, unspecified, initial encounter: Secondary | ICD-10-CM | POA: Diagnosis present

## 2017-03-15 DIAGNOSIS — Z7984 Long term (current) use of oral hypoglycemic drugs: Secondary | ICD-10-CM | POA: Diagnosis not present

## 2017-03-15 DIAGNOSIS — S72332A Displaced oblique fracture of shaft of left femur, initial encounter for closed fracture: Secondary | ICD-10-CM | POA: Diagnosis not present

## 2017-03-15 DIAGNOSIS — M8580 Other specified disorders of bone density and structure, unspecified site: Secondary | ICD-10-CM | POA: Diagnosis present

## 2017-03-15 DIAGNOSIS — S72112A Displaced fracture of greater trochanter of left femur, initial encounter for closed fracture: Secondary | ICD-10-CM | POA: Diagnosis not present

## 2017-03-15 DIAGNOSIS — S72009A Fracture of unspecified part of neck of unspecified femur, initial encounter for closed fracture: Secondary | ICD-10-CM | POA: Diagnosis present

## 2017-03-15 DIAGNOSIS — S7292XA Unspecified fracture of left femur, initial encounter for closed fracture: Secondary | ICD-10-CM | POA: Diagnosis not present

## 2017-03-15 DIAGNOSIS — D638 Anemia in other chronic diseases classified elsewhere: Secondary | ICD-10-CM | POA: Diagnosis present

## 2017-03-15 DIAGNOSIS — R52 Pain, unspecified: Secondary | ICD-10-CM

## 2017-03-15 DIAGNOSIS — I700 Atherosclerosis of aorta: Secondary | ICD-10-CM | POA: Diagnosis present

## 2017-03-15 DIAGNOSIS — S72302A Unspecified fracture of shaft of left femur, initial encounter for closed fracture: Secondary | ICD-10-CM | POA: Diagnosis present

## 2017-03-15 DIAGNOSIS — I5032 Chronic diastolic (congestive) heart failure: Secondary | ICD-10-CM | POA: Diagnosis present

## 2017-03-15 DIAGNOSIS — F419 Anxiety disorder, unspecified: Secondary | ICD-10-CM | POA: Diagnosis present

## 2017-03-15 DIAGNOSIS — I447 Left bundle-branch block, unspecified: Secondary | ICD-10-CM | POA: Diagnosis present

## 2017-03-15 DIAGNOSIS — D62 Acute posthemorrhagic anemia: Secondary | ICD-10-CM | POA: Diagnosis not present

## 2017-03-15 DIAGNOSIS — D509 Iron deficiency anemia, unspecified: Secondary | ICD-10-CM | POA: Diagnosis present

## 2017-03-15 DIAGNOSIS — Z791 Long term (current) use of non-steroidal anti-inflammatories (NSAID): Secondary | ICD-10-CM | POA: Diagnosis not present

## 2017-03-15 DIAGNOSIS — Z8669 Personal history of other diseases of the nervous system and sense organs: Secondary | ICD-10-CM | POA: Diagnosis not present

## 2017-03-15 DIAGNOSIS — L89153 Pressure ulcer of sacral region, stage 3: Secondary | ICD-10-CM | POA: Diagnosis present

## 2017-03-15 DIAGNOSIS — M979XXA Periprosthetic fracture around unspecified internal prosthetic joint, initial encounter: Secondary | ICD-10-CM | POA: Diagnosis present

## 2017-03-15 DIAGNOSIS — Z7982 Long term (current) use of aspirin: Secondary | ICD-10-CM

## 2017-03-15 DIAGNOSIS — E118 Type 2 diabetes mellitus with unspecified complications: Secondary | ICD-10-CM | POA: Diagnosis not present

## 2017-03-15 DIAGNOSIS — W19XXXA Unspecified fall, initial encounter: Secondary | ICD-10-CM

## 2017-03-15 DIAGNOSIS — S73005A Unspecified dislocation of left hip, initial encounter: Secondary | ICD-10-CM

## 2017-03-15 DIAGNOSIS — Y92009 Unspecified place in unspecified non-institutional (private) residence as the place of occurrence of the external cause: Secondary | ICD-10-CM

## 2017-03-15 DIAGNOSIS — I110 Hypertensive heart disease with heart failure: Secondary | ICD-10-CM | POA: Diagnosis present

## 2017-03-15 DIAGNOSIS — Z79899 Other long term (current) drug therapy: Secondary | ICD-10-CM

## 2017-03-15 DIAGNOSIS — E871 Hypo-osmolality and hyponatremia: Secondary | ICD-10-CM | POA: Diagnosis present

## 2017-03-15 DIAGNOSIS — L8990 Pressure ulcer of unspecified site, unspecified stage: Secondary | ICD-10-CM | POA: Diagnosis not present

## 2017-03-15 DIAGNOSIS — I10 Essential (primary) hypertension: Secondary | ICD-10-CM | POA: Diagnosis not present

## 2017-03-15 DIAGNOSIS — T84021A Dislocation of internal left hip prosthesis, initial encounter: Secondary | ICD-10-CM | POA: Diagnosis present

## 2017-03-15 DIAGNOSIS — Z87891 Personal history of nicotine dependence: Secondary | ICD-10-CM

## 2017-03-15 DIAGNOSIS — S72002A Fracture of unspecified part of neck of left femur, initial encounter for closed fracture: Secondary | ICD-10-CM | POA: Diagnosis not present

## 2017-03-15 DIAGNOSIS — S73006A Unspecified dislocation of unspecified hip, initial encounter: Secondary | ICD-10-CM | POA: Diagnosis not present

## 2017-03-15 DIAGNOSIS — Z419 Encounter for procedure for purposes other than remedying health state, unspecified: Secondary | ICD-10-CM

## 2017-03-15 HISTORY — DX: Type 2 diabetes mellitus without complications: E11.9

## 2017-03-15 HISTORY — DX: Atherosclerosis of aorta: I70.0

## 2017-03-15 HISTORY — DX: Unspecified fall, initial encounter: W19.XXXA

## 2017-03-15 HISTORY — DX: Repeated falls: R29.6

## 2017-03-15 HISTORY — DX: Anxiety disorder, unspecified: F41.9

## 2017-03-15 HISTORY — DX: Gastro-esophageal reflux disease without esophagitis: K21.9

## 2017-03-15 HISTORY — DX: Presence of unspecified artificial hip joint: M97.8XXA

## 2017-03-15 HISTORY — DX: Presence of unspecified artificial hip joint: Z96.649

## 2017-03-15 HISTORY — DX: Pressure ulcer of sacral region, stage 3: L89.153

## 2017-03-15 LAB — URINALYSIS, ROUTINE W REFLEX MICROSCOPIC
BACTERIA UA: NONE SEEN
BILIRUBIN URINE: NEGATIVE
GLUCOSE, UA: NEGATIVE mg/dL
KETONES UR: NEGATIVE mg/dL
NITRITE: NEGATIVE
Protein, ur: 30 mg/dL — AB
SPECIFIC GRAVITY, URINE: 1.018 (ref 1.005–1.030)
Squamous Epithelial / LPF: NONE SEEN
pH: 6 (ref 5.0–8.0)

## 2017-03-15 LAB — COMPREHENSIVE METABOLIC PANEL
ALK PHOS: 79 U/L (ref 38–126)
ALT: 14 U/L (ref 14–54)
ANION GAP: 6 (ref 5–15)
AST: 22 U/L (ref 15–41)
Albumin: 2.9 g/dL — ABNORMAL LOW (ref 3.5–5.0)
BUN: 9 mg/dL (ref 6–20)
CALCIUM: 9 mg/dL (ref 8.9–10.3)
CHLORIDE: 94 mmol/L — AB (ref 101–111)
CO2: 27 mmol/L (ref 22–32)
CREATININE: 0.68 mg/dL (ref 0.44–1.00)
Glucose, Bld: 103 mg/dL — ABNORMAL HIGH (ref 65–99)
Potassium: 4.3 mmol/L (ref 3.5–5.1)
SODIUM: 127 mmol/L — AB (ref 135–145)
Total Bilirubin: 0.7 mg/dL (ref 0.3–1.2)
Total Protein: 7.9 g/dL (ref 6.5–8.1)

## 2017-03-15 LAB — PROTIME-INR
INR: 1.03
PROTHROMBIN TIME: 13.4 s (ref 11.4–15.2)

## 2017-03-15 LAB — CBC
HCT: 33.1 % — ABNORMAL LOW (ref 36.0–46.0)
Hemoglobin: 10.7 g/dL — ABNORMAL LOW (ref 12.0–15.0)
MCH: 26.7 pg (ref 26.0–34.0)
MCHC: 32.3 g/dL (ref 30.0–36.0)
MCV: 82.5 fL (ref 78.0–100.0)
PLATELETS: 297 10*3/uL (ref 150–400)
RBC: 4.01 MIL/uL (ref 3.87–5.11)
RDW: 13.9 % (ref 11.5–15.5)
WBC: 5.5 10*3/uL (ref 4.0–10.5)

## 2017-03-15 LAB — APTT: APTT: 33 s (ref 24–36)

## 2017-03-15 MED ORDER — SODIUM CHLORIDE 0.9 % IV SOLN
INTRAVENOUS | Status: AC
  Administered 2017-03-15 (×2): via INTRAVENOUS

## 2017-03-15 MED ORDER — TRAMADOL HCL 50 MG PO TABS: 50.0000 mg | ORAL_TABLET | Freq: Four times a day (QID) | ORAL | Status: DC | PRN

## 2017-03-15 MED ORDER — METHOCARBAMOL 500 MG PO TABS
500.0000 mg | ORAL_TABLET | Freq: Four times a day (QID) | ORAL | Status: DC | PRN
  Administered 2017-03-16 (×2): 500 mg via ORAL
  Filled 2017-03-15 (×2): qty 1

## 2017-03-15 MED ORDER — LATANOPROST 0.005 % OP SOLN
1.0000 [drp] | Freq: Every day | OPHTHALMIC | Status: DC
  Administered 2017-03-15 – 2017-03-20 (×6): 1 [drp] via OPHTHALMIC
  Filled 2017-03-15: qty 2.5

## 2017-03-15 MED ORDER — MORPHINE SULFATE (PF) 4 MG/ML IV SOLN
0.5000 mg | INTRAVENOUS | Status: DC | PRN
  Administered 2017-03-16 – 2017-03-17 (×3): 0.52 mg via INTRAVENOUS
  Filled 2017-03-15 (×3): qty 1

## 2017-03-15 MED ORDER — LOXAPINE SUCCINATE 5 MG PO CAPS: 20.0000 mg | ORAL_CAPSULE | Freq: Every day | ORAL | Status: DC

## 2017-03-15 MED ORDER — DSS 100 MG PO CAPS: 100.0000 mg | ORAL_CAPSULE | Freq: Two times a day (BID) | ORAL | Status: DC

## 2017-03-15 MED ORDER — FERROUS GLUCONATE 324 (38 FE) MG PO TABS
325.0000 mg | ORAL_TABLET | Freq: Two times a day (BID) | ORAL | Status: DC
  Administered 2017-03-16 (×2): 325 mg via ORAL
  Filled 2017-03-15 (×7): qty 1

## 2017-03-15 MED ORDER — DORZOLAMIDE HCL-TIMOLOL MAL 2-0.5 % OP SOLN
1.0000 [drp] | Freq: Two times a day (BID) | OPHTHALMIC | Status: DC
  Administered 2017-03-16 – 2017-03-21 (×11): 1 [drp] via OPHTHALMIC
  Filled 2017-03-15: qty 10

## 2017-03-15 MED ORDER — BISACODYL 10 MG RE SUPP: 10.0000 mg | Freq: Every day | RECTAL | Status: DC | PRN

## 2017-03-15 MED ORDER — METHOCARBAMOL 1000 MG/10ML IJ SOLN
500.0000 mg | Freq: Four times a day (QID) | INTRAVENOUS | Status: DC | PRN
  Administered 2017-03-17: 500 mg via INTRAVENOUS
  Filled 2017-03-15 (×3): qty 5

## 2017-03-15 MED ORDER — PANTOPRAZOLE SODIUM 40 MG PO TBEC
40.0000 mg | DELAYED_RELEASE_TABLET | Freq: Every day | ORAL | Status: DC
  Administered 2017-03-16 – 2017-03-21 (×6): 40 mg via ORAL
  Filled 2017-03-15 (×6): qty 1

## 2017-03-15 MED ORDER — QUETIAPINE FUMARATE 50 MG PO TABS
25.0000 mg | ORAL_TABLET | Freq: Every day | ORAL | Status: DC
  Administered 2017-03-15 – 2017-03-20 (×6): 25 mg via ORAL
  Filled 2017-03-15 (×6): qty 1

## 2017-03-15 MED ORDER — ENOXAPARIN SODIUM 40 MG/0.4ML ~~LOC~~ SOLN: 40.0000 mg | SUBCUTANEOUS | Status: DC

## 2017-03-15 MED ORDER — HYDROCODONE-ACETAMINOPHEN 5-325 MG PO TABS
1.0000 | ORAL_TABLET | Freq: Four times a day (QID) | ORAL | Status: DC | PRN
  Administered 2017-03-15 – 2017-03-16 (×2): 1 via ORAL
  Administered 2017-03-16: 2 via ORAL
  Administered 2017-03-16: 1 via ORAL
  Filled 2017-03-15 (×3): qty 1
  Filled 2017-03-15: qty 2

## 2017-03-15 MED ORDER — OXYBUTYNIN CHLORIDE 5 MG PO TABS
5.0000 mg | ORAL_TABLET | Freq: Two times a day (BID) | ORAL | Status: DC
  Administered 2017-03-15 – 2017-03-21 (×11): 5 mg via ORAL
  Filled 2017-03-15 (×11): qty 1

## 2017-03-15 MED ORDER — MELOXICAM 7.5 MG PO TABS
15.0000 mg | ORAL_TABLET | Freq: Every day | ORAL | Status: DC
  Administered 2017-03-16 – 2017-03-21 (×5): 15 mg via ORAL
  Filled 2017-03-15 (×6): qty 2

## 2017-03-15 MED ORDER — HEPARIN SODIUM (PORCINE) 5000 UNIT/ML IJ SOLN
5000.0000 [IU] | Freq: Three times a day (TID) | INTRAMUSCULAR | Status: DC
  Administered 2017-03-15 – 2017-03-16 (×3): 5000 [IU] via SUBCUTANEOUS
  Filled 2017-03-15 (×3): qty 1

## 2017-03-15 MED ORDER — SODIUM CHLORIDE 0.9 % IV BOLUS (SEPSIS)
500.0000 mL | Freq: Once | INTRAVENOUS | Status: AC
  Administered 2017-03-15: 500 mL via INTRAVENOUS

## 2017-03-15 MED ORDER — CITALOPRAM HYDROBROMIDE 20 MG PO TABS: 10.0000 mg | ORAL_TABLET | Freq: Every day | ORAL | Status: DC

## 2017-03-15 MED ORDER — ALPRAZOLAM 0.5 MG PO TABS: 0.5000 mg | ORAL_TABLET | Freq: Every evening | ORAL | Status: DC | PRN

## 2017-03-15 MED ORDER — AMLODIPINE BESYLATE 5 MG PO TABS: 5.0000 mg | ORAL_TABLET | Freq: Every day | ORAL | Status: DC

## 2017-03-15 MED ORDER — AMITRIPTYLINE HCL 25 MG PO TABS
25.0000 mg | ORAL_TABLET | Freq: Every day | ORAL | Status: DC
  Administered 2017-03-16 – 2017-03-21 (×5): 25 mg via ORAL
  Filled 2017-03-15 (×5): qty 1

## 2017-03-15 MED ORDER — LISINOPRIL 20 MG PO TABS
20.0000 mg | ORAL_TABLET | Freq: Every day | ORAL | Status: DC
  Administered 2017-03-15 – 2017-03-21 (×4): 20 mg via ORAL
  Filled 2017-03-15 (×5): qty 1

## 2017-03-15 MED ORDER — HEPARIN SODIUM (PORCINE) 5000 UNIT/ML IJ SOLN: 5000.0000 [IU] | Freq: Three times a day (TID) | INTRAMUSCULAR | Status: DC

## 2017-03-15 MED ORDER — DOCUSATE SODIUM 100 MG PO CAPS
100.0000 mg | ORAL_CAPSULE | Freq: Two times a day (BID) | ORAL | Status: DC
  Administered 2017-03-16: 100 mg via ORAL
  Filled 2017-03-15: qty 1

## 2017-03-15 NOTE — H&P (Signed)
History and Physical    [name] BDZ:[id number] DOB: 1934-02-06 DOA: 03/15/2017  PCP: Terrial Rhodes, MD Patient coming from: [facility]  Chief Complaint: left hip fx  HPI: [name] is a delightful 83 y.o. female with medical history significant multiple left hip fracture, diabetes, anemia, hypertension, left bundle branch block presents to [facility] room 6 on 6 N. from [place] the chief complaint of left hip fracture. [facility] are asked to admit  Information is obtained from the patient and the daughter who is at the bedside. Patient states she got up during the night to go to the bedside commode. She uses a walker for ambulation and when she went to reach for her walker she "missed" and she fell onto her left hip. She states this happened 2 days ago. Initially it did not hurt. He reports she then fell again. At that point she felt pain in her left hip and had difficulty getting up. She does report she had hip surgery in 2014 ever since in her left leg is been shorter. She denies headache dizziness syncope or near-syncope. She denies chest pain palpitation shortness of breath lower extremity edema. She denies abdominal pain nausea vomiting diarrhea constipation melena bright red blood per rectum. She denies dysuria hematuria frequency or urgency. She does report decreased oral intake due to lack of appetite.    ED Course: Upon arrival she slightly hypertensive afebrile hemodynamically stable and not hypoxic.  Review of Systems: As per HPI otherwise all other systems reviewed and are negative.   Ambulatory Status: Ambulat4ed with a walker prior to admission  Past Medical History:  Diagnosis Date  . Anemia    takes Ferrous Gluconate daily  . Arthritis   . Bell's palsy   . Depression    takes Xanax and Citalopram daily  . Diabetes mellitus without complication    type 2;takes Metformin daily  . Heart murmur   . Hypertension    takes Lisinopril and  Amlodipine daily  . Left bundle branch block     Past Surgical History:  Procedure Laterality Date  . APPENDECTOMY    . ARTHROPLASTY BIPOLAR HIP (HEMIARTHROPLASTY) Left 05/20/2012   Performed by Marybelle Killings, MD at [facility] OR  . HEMIARTHROPLASTY HIP Left 06/09/2012   ORIF revision     Dr Lorin Mercy  . TOTAL HIP REVISION Left 06/08/2012   Performed by Marybelle Killings, MD at [facility]  . TUBAL LIGATION      Social History   Socioeconomic History  . Marital status: Widowed    Spouse name: Not on file  . Number of children: Not on file  . Years of education: Not on file  . Highest education level: Not on file  Social Needs  . Financial resource strain: Not on file  . Food insecurity - worry: Not on file  . Food insecurity - inability: Not on file  . Transportation needs - medical: Not on file  . Transportation needs - non-medical: Not on file  Occupational History  . Not on file  Tobacco Use  . Smoking status: Former Smoker    Last attempt to quit: 05/20/1975    Years since quitting: 41.8  . Smokeless tobacco: Never Used  Substance and Sexual Activity  . Alcohol use: No  . Drug use: No  . Sexual activity: No  Other Topics Concern  . Not on file  Social History Narrative  . Not on file    No Known Allergies  No  family history on file. Family medical history reviewed noncontributory to the admission of this elderly lady  Prior to Admission medications   Medication Sig Start Date End Date Taking? Authorizing Provider  ALPRAZolam Duanne Moron) 0.5 MG tablet Take 0.5 mg by mouth at bedtime as needed for sleep or anxiety.     [provider]  amitriptyline (ELAVIL) 25 MG tablet Take 25-50 mg daily by mouth. 03/10/17   [provider]  amLODipine (NORVASC) 5 MG tablet Take 5 mg by mouth daily.    [provider]  aspirin EC 325 MG EC tablet Take 1 tablet (325 mg total) by mouth daily with breakfast. 06/11/12   Petrarca, Mike Craze, PA-C  bisacodyl (DULCOLAX) 10 MG  suppository Place 1 suppository (10 mg total) rectally daily as needed. 06/11/12   Petrarca, Mike Craze, PA-C  CALMOSEPTINE 0.44-20.6 % OINT Apply 1 application 2 (two) times daily as needed topically for rash. 02/01/17   [provider]  citalopram (CELEXA) 10 MG tablet Take 10 mg by mouth daily.    [provider]  docusate sodium 100 MG CAPS Take 100 mg by mouth 2 (two) times daily. 06/11/12   Cherylann Ratel, PA-C  dorzolamide-timolol (COSOPT) 22.3-6.8 MG/ML ophthalmic solution Place 1 drop 2 (two) times daily into both eyes. 03/10/17   [provider]  ferrous gluconate (FERGON) 325 MG tablet Take 325 mg by mouth 2 (two) times daily.    [provider]  ferrous sulfate 325 (65 FE) MG tablet Take 325 mg 2 (two) times daily by mouth. 02/02/17   [provider]  HYDROcodone-acetaminophen (NORCO/VICODIN) 5-325 MG per tablet Take 1 tablet by mouth every 4 (four) hours as needed for pain. 06/11/12   Cherylann Ratel, PA-C  hydrOXYzine (ATARAX/VISTARIL) 25 MG tablet Take 25 mg at bedtime as needed by mouth for sleep. 01/01/17   [provider]  KLOR-CON M20 20 MEQ tablet Take 20 mEq daily by mouth. with food 03/09/17   [provider]  latanoprost (XALATAN) 0.005 % ophthalmic solution Place 1 drop at bedtime into both eyes. 03/09/17   [provider]  lisinopril (PRINIVIL,ZESTRIL) 20 MG tablet Take 20 mg by mouth daily.    [provider]  loxapine (LOXITANE) 10 MG capsule Take 20 mg by mouth at bedtime.     [provider]  meloxicam (MOBIC) 15 MG tablet Take 15 mg daily by mouth. 02/08/17   [provider]  metFORMIN (GLUCOPHAGE-XR) 500 MG 24 hr tablet Take 500 mg by mouth daily with breakfast.    [provider]  oxybutynin (DITROPAN) 5 MG tablet Take 5 mg 2 (two) times daily by mouth. 02/27/17   [provider]  pantoprazole (PROTONIX) 40 MG tablet Take 40 mg daily before breakfast by mouth.  02/24/17   [provider]  QUEtiapine (SEROQUEL) 25 MG tablet Take 25-50 mg at bedtime by mouth. 02/18/17   [provider]  senna-docusate (SENOKOT-S) 8.6-50 MG per tablet Take 1 tablet by mouth at bedtime as needed. 06/11/12   Cherylann Ratel, PA-C  traMADol (ULTRAM) 50 MG tablet Take 50-100 mg every 6 (six) hours as needed by mouth for severe pain. 12/14/16   [provider]  zolpidem (AMBIEN) 5 MG tablet Take 5 mg at bedtime as needed by mouth for sleep. 03/05/17   [provider]    Physical Exam: Vitals:   03/15/17 1354  BP: (!) 177/72  Pulse: 96  Resp: 18  Temp: 100.2 F (  37.9 C)  TempSrc: Oral  SpO2: 94%  Weight: 53.1 kg (117 lb 1 oz)  Height: 5\' 4"  (1.626 m)     General:  Appears calm and comfortable in no acute distress Eyes:  PERRL, EOMI, normal lids, iris ENT:  grossly normal hearing, lips & tongue, because membranes of her mouth are pink somewhat dry Neck:  no LAD, masses or thyromegaly Cardiovascular:  RRR, no m/r/g. No LE edema. Pedal pulses present and palpable Respiratory:  CTA bilaterally, no w/r/r. Normal respiratory effort. Abdomen:  soft, ntnd, positive bowel sounds no guarding or rebounding Skin:  no rash or induration seen on limited exam Musculoskeletal:  Left leg externally rotated and somewhat shorter than the right. Left knee and hip tender to touch decreased range of motion due to pain Psychiatric:  grossly normal mood and affect, speech fluent and appropriate, AOx3 Neurologic:  CN 2-12 grossly intact, moves all extremities in coordinated fashion, sensation intact alert and oriented to person and place. Follows commands  Labs on Admission: I have personally reviewed following labs and imaging studies  CBC: No results for input(s): WBC, NEUTROABS, HGB, HCT, MCV, PLT in the last 168 hours. Basic Metabolic Panel: No results for input(s): NA, K, CL, CO2, GLUCOSE, BUN, CREATININE, CALCIUM, MG, PHOS in the last 168  hours. GFR: CrCl cannot be calculated (Patient's most recent lab result is older than the maximum 21 days allowed.). Liver Function Tests: No results for input(s): AST, ALT, ALKPHOS, BILITOT, PROT, ALBUMIN in the last 168 hours. No results for input(s): LIPASE, AMYLASE in the last 168 hours. No results for input(s): AMMONIA in the last 168 hours. Coagulation Profile: No results for input(s): INR, PROTIME in the last 168 hours. Cardiac Enzymes: No results for input(s): CKTOTAL, CKMB, CKMBINDEX, TROPONINI in the last 168 hours. BNP (last 3 results) No results for input(s): PROBNP in the last 8760 hours. HbA1C: No results for input(s): HGBA1C in the last 72 hours. CBG: No results for input(s): GLUCAP in the last 168 hours. Lipid Profile: No results for input(s): CHOL, HDL, LDLCALC, TRIG, CHOLHDL, LDLDIRECT in the last 72 hours. Thyroid Function Tests: No results for input(s): TSH, T4TOTAL, FREET4, T3FREE, THYROIDAB in the last 72 hours. Anemia Panel: No results for input(s): VITAMINB12, FOLATE, FERRITIN, TIBC, IRON, RETICCTPCT in the last 72 hours. Urine analysis:    Component Value Date/Time   COLORURINE YELLOW 06/08/2012 [place] 06/08/2012 1148   LABSPEC 1.008 06/08/2012 1148   PHURINE 7.0 06/08/2012 1148   GLUCOSEU NEGATIVE 06/08/2012 1148   HGBUR NEGATIVE 06/08/2012 1148   BILIRUBINUR NEGATIVE 06/08/2012 1148   KETONESUR NEGATIVE 06/08/2012 1148   PROTEINUR NEGATIVE 06/08/2012 1148   UROBILINOGEN 0.2 06/08/2012 1148   NITRITE NEGATIVE 06/08/2012 1148   LEUKOCYTESUR NEGATIVE 06/08/2012 1148    Creatinine Clearance: CrCl cannot be calculated (Patient's most recent lab result is older than the maximum 21 days allowed.).  Sepsis Labs: @LABRCNTIP (procalcitonin:4,lacticidven:4) )No results found for this or any previous visit (from the past 240 hour(s)).   Radiological Exams on Admission: No results found.  EKG: Independently reviewed. Normal sinus  rhythm, Left axis deviation Left bundle branch block   Assessment/Plan Principal Problem:   Hip fracture (HCC) Active Problems:   Hypertension   Type 2 diabetes mellitus (HCC)   Hyponatremia   Anemia   #1. Hip fracture on the left. As a result of a mechanical fall. Paperwork from [facility] reveals dislocated left hip prosthesis and fracture proximal femoral shaft. History of same. Dr.  Yates contacted and accepted requesting hospitalist admission. Of note CT head without acute abnormality.  -Admit -Obtain a chest x-ray -Complete metabolic panel CBC PT INR -EKG -Urinalysisx -chest xray -Pain control  #2. Hyponatremia. Reportedly her sodium was 126 in [place]. Likely related to decreased oral intake. Requested that she receive 1 L normal saline and route. -Follow sodium level -serum osmolality -urine osmolality -monitor  #3. Hypertension. Blood pressure high end of normal. Home medications include lisinopril. -resume home meds -monitor  #4. Diabetes. Serum glucose 106. Home meds include metformin -hold metformin -carb modified diet.  -monitor  #5. Anemia. Hemoglobin 10.4. History of iron deficiency anemia. -continue home meds -monitor     DVT prophylaxis: heparin  Code Status: full  Family Communication: daughter at bedside  Disposition Plan: snf likely  Consults called: yates  Admission status: inpatient    Radene Gunning MD [facility]  If 7PM-7AM, please contact night-coverage [email or web address] Password [email or web address]  03/15/2017, 2:43 PM

## 2017-03-15 NOTE — Progress Notes (Signed)
Orthopedic Tech Progress Note Patient Details:  [name] [birth year] [id number]  Musculoskeletal Traction Type of Traction: Bucks Skin Traction Traction Weight: 5 lbs    [name] 03/15/2017, 7:11 PM

## 2017-03-15 NOTE — Consult Note (Signed)
Patient ID: [name] MRN: [id number] DOB/AGE: October 30, 1933 83 y.o.  Admit date: 03/15/2017  Admission Diagnoses:  Principal Problem:   Hip fracture ([place]) Active Problems:   Hypertension   Type 2 diabetes mellitus (HCC)   Hyponatremia   Anemia   HPI: 83 year old female is being seen at the request of medical team for left hip dislocation and periprostatic femur fracture. Patient's daughter in room.Patient states that she's had multiple falls recently and reports another fall about 2:00 this morning. she was unable to weight-bear after this incident. Patient called her son who immediately contacted EMS.  atient is status post left hip hemiarthroplasty by Dr. Lorin Mercy 05/20/2012 and status post revision for periprosthetic fracture June 08, 2012.  Patient denies any other injuries.  Past Medical History: Past Medical History:  Diagnosis Date  . Anemia    takes Ferrous Gluconate daily  . Arthritis   . Bell's palsy   . Depression    takes Xanax and Citalopram daily  . Diabetes mellitus without complication ([place])    type 2;takes Metformin daily  . Heart murmur   . Hypertension    takes Lisinopril and Amlodipine daily  . Left bundle branch block     Surgical History: Past Surgical History:  Procedure Laterality Date  . APPENDECTOMY    . ARTHROPLASTY BIPOLAR HIP (HEMIARTHROPLASTY) Left 05/20/2012   Performed by Marybelle Killings, MD at [facility] OR  . HEMIARTHROPLASTY HIP Left 06/09/2012   ORIF revision     Dr Lorin Mercy  . TOTAL HIP REVISION Left 06/08/2012   Performed by Marybelle Killings, MD at [facility]  . TUBAL LIGATION      Family History: No family history on file.  Social History: Social History   Socioeconomic History  . Marital status: Widowed    Spouse name: Not on file  . Number of children: Not on file  . Years of education: Not on file  . Highest education level: Not on file  Social Needs  . Financial resource strain: Not on file  . Food insecurity - worry: Not on  file  . Food insecurity - inability: Not on file  . Transportation needs - medical: Not on file  . Transportation needs - non-medical: Not on file  Occupational History  . Not on file  Tobacco Use  . Smoking status: Former Smoker    Last attempt to quit: 05/20/1975    Years since quitting: 41.8  . Smokeless tobacco: Never Used  Substance and Sexual Activity  . Alcohol use: No  . Drug use: No  . Sexual activity: No  Other Topics Concern  . Not on file  Social History Narrative  . Not on file    Allergies: Patient has no known allergies.  Medications: I have reviewed the patient's current medications.  Vital Signs: Patient Vitals for the past 24 hrs:  BP Temp Temp src Pulse Resp SpO2 Height Weight  03/15/17 1354 (!) 177/72 100.2 F (37.9 C) Oral 96 18 94 % 5\' 4"  (1.626 m) 117 lb 1 oz (53.1 kg)    Radiology: Dg Pelvis 1-2 Views  Result Date: 03/15/2017 CLINICAL DATA:  Fall, multiple left hip surgeries EXAM: PELVIS - 1-2 VIEW COMPARISON:  Pelvic radiographs dated 05/20/2012 FINDINGS: Left hip hemiarthroplasty with cerclage wire revision. Prosthetic femoral head is dislocated relative to the acetabulum. Overlying heterotopic calcification. Left pelvic ring fractures are difficult to exclude. Degenerative changes of the lower lumbar spine. IMPRESSION: Left hip hemiarthroplasty with prosthetic dislocation. Left pelvic  ring fractures are difficult to exclude. Consider dedicated left hip radiographs, possibly after relocation. Electronically Signed   By: Julian Hy M.D.   On: 03/15/2017 15:14   Chest Portable 1 View  Result Date: 03/15/2017 CLINICAL DATA:  [place] from a standing position. History of diabetes, asthma, hypertension, former smoker. EXAM: PORTABLE CHEST 1 VIEW COMPARISON:  Chest x-ray of June 07, 2012 FINDINGS: The lungs are mildly hyperinflated and clear. The heart and pulmonary vascularity are normal. The mediastinum is normal in width. There is calcification  in the wall of the aortic arch. The observed bony thorax is unremarkable. IMPRESSION: Mild chronic bronchitic-smoking related changes. There is no acute cardiopulmonary disease. Thoracic aortic atherosclerosis. Electronically Signed   By: David  Martinique M.D.   On: 03/15/2017 15:09   Dg Femur 1v Left  Result Date: 03/15/2017 CLINICAL DATA:  Femoral fracture. EXAM: LEFT FEMUR 1 VIEW COMPARISON:  Radiograph of same day. FINDINGS: Left hip arthroplasty is again noted. The fracture noted around the distal portion of the femoral prosthesis does not appear to be displaced anteriorly or posteriorly. IMPRESSION: No anterior or posterior displacement is seen involving the fracture around the distal portion of femoral prosthesis. Electronically Signed   By: Marijo Conception, M.D.   On: 03/15/2017 15:45   Dg Femur 1v Left  Result Date: 03/15/2017 CLINICAL DATA:  Fall EXAM: LEFT FEMUR 1 VIEW COMPARISON:  Pelvic radiograph 03/15/2017 FINDINGS: There is a left total hip arthroplasty that is dislocated from the pelvis, more completely characterized on the dedicated pelvic radiographs. There is an oblique, mildly laterally displaced fracture of the proximal shaft of the femur, approximately 8 cm from the distal tip of the femoral prosthesis. IMPRESSION: Left total hip arthroplasty femoral component periprosthetic oblique fracture with mild lateral displacement. Electronically Signed   By: Ulyses Jarred M.D.   On: 03/15/2017 15:35    Labs: No results for input(s): WBC, RBC, HCT, PLT in the last 72 hours. No results for input(s): NA, K, CL, CO2, BUN, CREATININE, GLUCOSE, CALCIUM in the last 72 hours. No results for input(s): LABPT, INR in the last 72 hours.  Review of Systems: Review of Systems  Constitutional: Negative.   Respiratory: Negative for shortness of breath.   Cardiovascular: Negative for chest pain.  Musculoskeletal: Positive for falls and joint pain.  Psychiatric/Behavioral: Negative.     Physical  Exam:  very pleasant elderly white female alert and oriented in no acute distress. No respiratory distress. Patient laying in bed. Left hip and femur tender palpation.Shortening left lower extremity. Calves nontender. She is neurologically intact. Pedal pulses are intact.  Assessment and Plan: Left hip dislocation and periprosthetic femur fracture.Status post previous left hip hemiarthroplasty 05/20/2012 and status post revision hip hemiarthroplasty for periprosthetic fracture 06/08/2012.  Dr. Lorin Mercy reviewed x-rays. Will have ortho tech apply 5 pounds Buck's traction.  We'll possibly plan for the OR Wednesday afternoon. Dr. Lorin Mercy will discuss with patient and family.  Reviewed films and outside xrays. Discussed with daughter and patient plan for Wed  Surgery . Troch fx has some bone formation with proximal migration suggesting it may be old fracture from one of her multiple 5 or 6 falls in the last year.  Tip of Solution Depuy stem is out of the canal. Midshaft fx with require plate and cables and possibles screws distally. If trochanter can be brought down will fix with trochanteric cable plate system.   Discussed in detail plan and they agree to proceed.

## 2017-03-15 NOTE — Progress Notes (Signed)
Ortho tech made aware of the order for bucks traction.

## 2017-03-16 ENCOUNTER — Telehealth (INDEPENDENT_AMBULATORY_CARE_PROVIDER_SITE_OTHER): Payer: Self-pay | Admitting: Orthopaedic Surgery

## 2017-03-16 LAB — SURGICAL PCR SCREEN
MRSA, PCR: NEGATIVE
Staphylococcus aureus: NEGATIVE

## 2017-03-16 LAB — CBC
HCT: 27.2 % — ABNORMAL LOW (ref 36.0–46.0)
Hemoglobin: 8.8 g/dL — ABNORMAL LOW (ref 12.0–15.0)
MCH: 26.7 pg (ref 26.0–34.0)
MCHC: 32.4 g/dL (ref 30.0–36.0)
MCV: 82.4 fL (ref 78.0–100.0)
PLATELETS: 251 10*3/uL (ref 150–400)
RBC: 3.3 MIL/uL — ABNORMAL LOW (ref 3.87–5.11)
RDW: 13.6 % (ref 11.5–15.5)
WBC: 6.6 10*3/uL (ref 4.0–10.5)

## 2017-03-16 LAB — PROTIME-INR
INR: 1.04
Prothrombin Time: 13.5 seconds (ref 11.4–15.2)

## 2017-03-16 LAB — BASIC METABOLIC PANEL
Anion gap: 4 — ABNORMAL LOW (ref 5–15)
Anion gap: 6 (ref 5–15)
BUN: 15 mg/dL (ref 6–20)
BUN: 14 mg/dL (ref 6–20)
CALCIUM: 8.2 mg/dL — AB (ref 8.9–10.3)
CALCIUM: 8.4 mg/dL — AB (ref 8.9–10.3)
CHLORIDE: 98 mmol/L — AB (ref 101–111)
CO2: 25 mmol/L (ref 22–32)
CO2: 24 mmol/L (ref 22–32)
CREATININE: 0.68 mg/dL (ref 0.44–1.00)
CREATININE: 0.78 mg/dL (ref 0.44–1.00)
Chloride: 95 mmol/L — ABNORMAL LOW (ref 101–111)
GFR calc Af Amer: >60 mL/min (ref >60)
GFR calc non Af Amer: >60 mL/min (ref >60)
GLUCOSE: 120 mg/dL — AB (ref 65–99)
Glucose, Bld: 103 mg/dL — ABNORMAL HIGH (ref 65–99)
POTASSIUM: 3.8 mmol/L (ref 3.5–5.1)
Potassium: 4 mmol/L (ref 3.5–5.1)
SODIUM: 127 mmol/L — AB (ref 135–145)
SODIUM: 125 mmol/L — AB (ref 135–145)

## 2017-03-16 LAB — PREPARE RBC (CROSSMATCH)

## 2017-03-16 LAB — APTT: aPTT: 34 seconds (ref 24–36)

## 2017-03-16 MED ORDER — ENSURE ENLIVE PO LIQD
237.0000 mL | Freq: Two times a day (BID) | ORAL | Status: DC
  Administered 2017-03-16: 237 mL via ORAL

## 2017-03-16 MED ORDER — GLUCERNA SHAKE PO LIQD
237.0000 mL | Freq: Two times a day (BID) | ORAL | Status: DC
  Administered 2017-03-18 – 2017-03-21 (×6): 237 mL via ORAL

## 2017-03-16 MED ORDER — POVIDONE-IODINE 10 % EX SWAB
2.0000 "application " | Freq: Once | CUTANEOUS | Status: AC
  Administered 2017-03-17: 2 via TOPICAL

## 2017-03-16 MED ORDER — CHLORHEXIDINE GLUCONATE 4 % EX LIQD
60.0000 mL | Freq: Once | CUTANEOUS | Status: AC
  Administered 2017-03-17: 4 via TOPICAL
  Filled 2017-03-16: qty 60

## 2017-03-16 NOTE — Telephone Encounter (Signed)
error 

## 2017-03-16 NOTE — Progress Notes (Signed)
Initial Nutrition Assessment  DOCUMENTATION CODES:   Not applicable  INTERVENTION:   -Glucerna Shake po BID, each supplement provides 220 kcal and 10 grams of protein  NUTRITION DIAGNOSIS:   Increased nutrient needs related to post-op healing as evidenced by estimated needs.  GOAL:   Patient will meet greater than or equal to 90% of their needs  MONITOR:   PO intake, Supplement acceptance, Labs, Weight trends, Skin, I & O's  REASON FOR ASSESSMENT:   Consult Assessment of nutrition requirement/status  ASSESSMENT:   [name] is a delightful 83 y.o. female with medical history significant multiple left hip fracture, diabetes, anemia, hypertension, left bundle branch block presents to [facility] room 6 on 6 N. from [place] the chief complaint of left hip fracture. [facility] are asked to admit  11/19- Bucks traction placed  Per orthopedics notes, plan for surgery tomorrow.   Spoke with pt and daughter at bedside. Both confirm good appetite both currently and PTA. Pt reports pt ate very well last night and this morning, consuming almost 100% of meals. Pt reports she consumes 2 meals and one snacks daily (meals consist of meat, starch, and vegetables or multiple vegetables, snacks are usually potato chips or cheese curls).   Pt denies any wt loss; reports UBW around 120#.   Discussed importance of good meal intake to promote healing. Pt amenable to supplements, reporting she usually drinks about 1 Ensure supplement daily.   Pt with good glycemic control PTA (last Hgb A1c: 5.9). Home DM medications 500 mg metformin daily.   Labs reviewed: Na: 127, CBGS: 103  NUTRITION - FOCUSED PHYSICAL EXAM:    Most Recent Value  Orbital Region  No depletion  Upper Arm Region  No depletion  Thoracic and Lumbar Region  No depletion  Buccal Region  Mild depletion  Temple Region  Mild depletion  Clavicle Bone Region  Mild depletion  Clavicle and Acromion Bone Region  No  depletion  Scapular Bone Region  Mild depletion  Dorsal Hand  Mild depletion  Patellar Region  Unable to assess  Anterior Thigh Region  Unable to assess  Posterior Calf Region  Unable to assess  Edema (RD Assessment)  None  Hair  Reviewed  Eyes  Reviewed  Mouth  Reviewed  Skin  Reviewed  Nails  Reviewed       Diet Order:  Diet heart healthy/carb modified Room service appropriate? Yes; Fluid consistency: Thin  EDUCATION NEEDS:   Education needs have been addressed  Skin:  Skin Assessment: Reviewed RN Assessment  Last BM:  03/12/17  Height:   Ht Readings from Last 1 Encounters:  03/15/17 5\' 4"  (1.626 m)    Weight:   Wt Readings from Last 1 Encounters:  03/15/17 117 lb 1 oz (53.1 kg)    Ideal Body Weight:  54.5 kg  BMI:  Body mass index is 20.09 kg/m.  Estimated Nutritional Needs:   Kcal:  1350-1550  Protein:  55-70 grams  Fluid:  1.3-1.5 L    Mireille Lacombe A. Jimmye Norman, RD, LDN, CDE Pager: [phone] After hours Pager: [phone]

## 2017-03-16 NOTE — Progress Notes (Signed)
Patient ID: [name], female   DOB: Sep 22, 1933, 83 y.o.   MRN: [id number]   Spoke with Dr. Lorin Mercy. We will stop heparin this evening since patient is scheduled for surgery tomorrow.

## 2017-03-16 NOTE — Progress Notes (Signed)
Subjective:    Patient reports pain as moderate.    Objective: Vital signs in last 24 hours: Temp:  [98.3 F (36.8 C)-100.2 F (37.9 C)] 98.3 F (36.8 C) (11/20 0530) Pulse Rate:  [92-96] 92 (11/20 0530) Resp:  [17-18] 18 (11/20 0530) BP: (111-177)/(46-72) 112/70 (11/20 0530) SpO2:  [93 %-94 %] 94 % (11/20 0530) Weight:  [117 lb 1 oz (53.1 kg)] 117 lb 1 oz (53.1 kg) (11/19 1354)  Intake/Output from previous day: 11/19 0701 - 11/20 0700 In: 734.6 [P.O.:180; I.V.:554.6] Out: 450 [Urine:450] Intake/Output this shift: No intake/output data recorded.  Recent Labs    03/15/17 1447 03/16/17 0541  HGB 10.7* 8.8*   Recent Labs    03/15/17 1447 03/16/17 0541  WBC 5.5 6.6  RBC 4.01 3.30*  HCT 33.1* 27.2*  PLT 297 251   Recent Labs    03/15/17 1447 03/16/17 0541  NA 127* 127*  K 4.3 4.0  CL 94* 98*  CO2 27 25  BUN 9 15  CREATININE 0.68 0.68  GLUCOSE 103* 103*  CALCIUM 9.0 8.2*   Recent Labs    03/15/17 1447  INR 1.03    Neurologically intact Dg Pelvis 1-2 Views  Result Date: 03/15/2017 CLINICAL DATA:  Fall, multiple left hip surgeries EXAM: PELVIS - 1-2 VIEW COMPARISON:  Pelvic radiographs dated 05/20/2012 FINDINGS: Left hip hemiarthroplasty with cerclage wire revision. Prosthetic femoral head is dislocated relative to the acetabulum. Overlying heterotopic calcification. Left pelvic ring fractures are difficult to exclude. Degenerative changes of the lower lumbar spine. IMPRESSION: Left hip hemiarthroplasty with prosthetic dislocation. Left pelvic ring fractures are difficult to exclude. Consider dedicated left hip radiographs, possibly after relocation. Electronically Signed   By: Julian Hy M.D.   On: 03/15/2017 15:14   Chest Portable 1 View  Result Date: 03/15/2017 CLINICAL DATA:  [place] from a standing position. History of diabetes, asthma, hypertension, former smoker. EXAM: PORTABLE CHEST 1 VIEW COMPARISON:  Chest x-ray of June 07, 2012  FINDINGS: The lungs are mildly hyperinflated and clear. The heart and pulmonary vascularity are normal. The mediastinum is normal in width. There is calcification in the wall of the aortic arch. The observed bony thorax is unremarkable. IMPRESSION: Mild chronic bronchitic-smoking related changes. There is no acute cardiopulmonary disease. Thoracic aortic atherosclerosis. Electronically Signed   By: David  Martinique M.D.   On: 03/15/2017 15:09   Dg Femur 1v Left  Result Date: 03/15/2017 CLINICAL DATA:  Femoral fracture. EXAM: LEFT FEMUR 1 VIEW COMPARISON:  Radiograph of same day. FINDINGS: Left hip arthroplasty is again noted. The fracture noted around the distal portion of the femoral prosthesis does not appear to be displaced anteriorly or posteriorly. IMPRESSION: No anterior or posterior displacement is seen involving the fracture around the distal portion of femoral prosthesis. Electronically Signed   By: Marijo Conception, M.D.   On: 03/15/2017 15:45   Dg Femur 1v Left  Result Date: 03/15/2017 CLINICAL DATA:  Fall EXAM: LEFT FEMUR 1 VIEW COMPARISON:  Pelvic radiograph 03/15/2017 FINDINGS: There is a left total hip arthroplasty that is dislocated from the pelvis, more completely characterized on the dedicated pelvic radiographs. There is an oblique, mildly laterally displaced fracture of the proximal shaft of the femur, approximately 8 cm from the distal tip of the femoral prosthesis. IMPRESSION: Left total hip arthroplasty femoral component periprosthetic oblique fracture with mild lateral displacement. Electronically Signed   By: Ulyses Jarred M.D.   On: 03/15/2017 15:35    Assessment/Plan:  Plan:  Surgery Wednesday for hip reduction and ORIF plate and cable fixation of femur shaft fracture. Discussed with pt that if greater troch fracture is old that it may not mobilize down for fixation. She understands and agrees to proceed.   [name] 03/16/2017, 7:55 AM

## 2017-03-16 NOTE — Progress Notes (Signed)
PROGRESS NOTE    [name]  WCB:[id number] DOB: Jan 10, 1934 DOA: 03/15/2017 PCP: Medicine, [facility]   Brief Narrative: [name] is a 83 y.o. female with a history of diabetes, hypertension, left bundle branch block.  Patient presented after a fall and was found to have a periprosthetic femur fracture and hip dislocation.  Orthopedic surgery was consulted and is planning for surgery on 03/17/2017.   Assessment & Plan:   Principal Problem:   Hip fracture ([place]) Active Problems:   Hypertension   Diabetes mellitus with complication (HCC)   Hyponatremia   Anemia, chronic disease   Left hip dislocation Periprosthetic femur fracture Pain is controlled with analgesics. -Orthopedic surgery recommendation: Plan for surgery tomorrow, 03/17/2017  Essential hypertension Normotensive.  He -Continue  dietary  Diabetes mellitus On metformin as an outpatient. -Continue sliding scale insulin  Anemia Chronic. At baseline. Normocytic. No iron panel in history.  Hyponatremia Patient appears euvolemic on exam.  Patient on Elavil, Celexa and Seroquel. No change with IV fluids. No osmolality/sodium labs. This is chronic with baseline between 127 and 131. Possible this could be secondary to reset osmostat. -Continue IV fluids and repeat BMP this afternoon  Chronic diastolic heart failure Grade 1 diastolic.    DVT prophylaxis: Heparin Code Status: Full code Family Communication: None at bedside Disposition Plan: Pending orthopedic management of fracture   Consultants:   Orthopedic surgery  Procedures:   None  Antimicrobials:  None    Subjective: Pain managed with analgesics.  Objective: Vitals:   03/15/17 1354 03/15/17 2222 03/16/17 0530  BP: (!) 177/72 (!) 111/46 112/70  Pulse: 96 93 92  Resp: 18 17 18   Temp: 100.2 F (37.9 C) 99.8 F (37.7 C) 98.3 F (36.8 C)  TempSrc: Oral    SpO2: 94% 93% 94%  Weight: 53.1 kg (117 lb 1 oz)    Height: 5\' 4"   (1.626 m)      Intake/Output Summary (Last 24 hours) at 03/16/2017 1203 Last data filed at 03/16/2017 0531 Gross per 24 hour  Intake 734.58 ml  Output 450 ml  Net 284.58 ml   Filed Weights   03/15/17 1354  Weight: 53.1 kg (117 lb 1 oz)    Examination:  General exam: Appears calm and comfortable Respiratory system: Clear to auscultation. Respiratory effort normal. Cardiovascular system: S1 & S2 heard, RRR. No murmurs, rubs, gallops or clicks. Gastrointestinal system: Abdomen is nondistended, soft and nontender. No organomegaly or masses felt. Normal bowel sounds heard. Central nervous system: Alert and oriented. No focal neurological deficits. Extremities: No edema. No calf tenderness. Traction device. Skin: No cyanosis. No rashes Psychiatry: Judgement and insight appear normal. Mood & affect appropriate.     Data Reviewed: I have personally reviewed following labs and imaging studies  CBC: Recent Labs  Lab 03/15/17 1447 03/16/17 0541  WBC 5.5 6.6  HGB 10.7* 8.8*  HCT 33.1* 27.2*  MCV 82.5 82.4  PLT 297 616   Basic Metabolic Panel: Recent Labs  Lab 03/15/17 1447 03/16/17 0541  NA 127* 127*  K 4.3 4.0  CL 94* 98*  CO2 27 25  GLUCOSE 103* 103*  BUN 9 15  CREATININE 0.68 0.68  CALCIUM 9.0 8.2*   GFR: Estimated Creatinine Clearance: 46.2 mL/min (by C-G formula based on SCr of 0.68 mg/dL). Liver Function Tests: Recent Labs  Lab 03/15/17 1447  AST 22  ALT 14  ALKPHOS 79  BILITOT 0.7  PROT 7.9  ALBUMIN 2.9*   No results for input(s): LIPASE, AMYLASE  in the last 168 hours. No results for input(s): AMMONIA in the last 168 hours. Coagulation Profile: Recent Labs  Lab 03/15/17 1447  INR 1.03   Cardiac Enzymes: No results for input(s): CKTOTAL, CKMB, CKMBINDEX, TROPONINI in the last 168 hours. BNP (last 3 results) No results for input(s): PROBNP in the last 8760 hours. HbA1C: No results for input(s): HGBA1C in the last 72 hours. CBG: No results  for input(s): GLUCAP in the last 168 hours. Lipid Profile: No results for input(s): CHOL, HDL, LDLCALC, TRIG, CHOLHDL, LDLDIRECT in the last 72 hours. Thyroid Function Tests: No results for input(s): TSH, T4TOTAL, FREET4, T3FREE, THYROIDAB in the last 72 hours. Anemia Panel: No results for input(s): VITAMINB12, FOLATE, FERRITIN, TIBC, IRON, RETICCTPCT in the last 72 hours. Sepsis Labs: No results for input(s): PROCALCITON, LATICACIDVEN in the last 168 hours.  No results found for this or any previous visit (from the past 240 hour(s)).       Radiology Studies: Dg Pelvis 1-2 Views  Result Date: 03/15/2017 CLINICAL DATA:  Fall, multiple left hip surgeries EXAM: PELVIS - 1-2 VIEW COMPARISON:  Pelvic radiographs dated 05/20/2012 FINDINGS: Left hip hemiarthroplasty with cerclage wire revision. Prosthetic femoral head is dislocated relative to the acetabulum. Overlying heterotopic calcification. Left pelvic ring fractures are difficult to exclude. Degenerative changes of the lower lumbar spine. IMPRESSION: Left hip hemiarthroplasty with prosthetic dislocation. Left pelvic ring fractures are difficult to exclude. Consider dedicated left hip radiographs, possibly after relocation. Electronically Signed   By: Julian Hy M.D.   On: 03/15/2017 15:14   Chest Portable 1 View  Result Date: 03/15/2017 CLINICAL DATA:  [place] from a standing position. History of diabetes, asthma, hypertension, former smoker. EXAM: PORTABLE CHEST 1 VIEW COMPARISON:  Chest x-ray of June 07, 2012 FINDINGS: The lungs are mildly hyperinflated and clear. The heart and pulmonary vascularity are normal. The mediastinum is normal in width. There is calcification in the wall of the aortic arch. The observed bony thorax is unremarkable. IMPRESSION: Mild chronic bronchitic-smoking related changes. There is no acute cardiopulmonary disease. Thoracic aortic atherosclerosis. Electronically Signed   By: David  Martinique M.D.   On:  03/15/2017 15:09   Dg Femur 1v Left  Result Date: 03/15/2017 CLINICAL DATA:  Femoral fracture. EXAM: LEFT FEMUR 1 VIEW COMPARISON:  Radiograph of same day. FINDINGS: Left hip arthroplasty is again noted. The fracture noted around the distal portion of the femoral prosthesis does not appear to be displaced anteriorly or posteriorly. IMPRESSION: No anterior or posterior displacement is seen involving the fracture around the distal portion of femoral prosthesis. Electronically Signed   By: Marijo Conception, M.D.   On: 03/15/2017 15:45   Dg Femur 1v Left  Result Date: 03/15/2017 CLINICAL DATA:  Fall EXAM: LEFT FEMUR 1 VIEW COMPARISON:  Pelvic radiograph 03/15/2017 FINDINGS: There is a left total hip arthroplasty that is dislocated from the pelvis, more completely characterized on the dedicated pelvic radiographs. There is an oblique, mildly laterally displaced fracture of the proximal shaft of the femur, approximately 8 cm from the distal tip of the femoral prosthesis. IMPRESSION: Left total hip arthroplasty femoral component periprosthetic oblique fracture with mild lateral displacement. Electronically Signed   By: Ulyses Jarred M.D.   On: 03/15/2017 15:35        Scheduled Meds: . amitriptyline  25 mg Oral Daily  . docusate sodium  100 mg Oral BID  . dorzolamide-timolol  1 drop Both Eyes BID  . ferrous gluconate  325 mg Oral BID  WC  . heparin  5,000 Units Subcutaneous Q8H  . latanoprost  1 drop Both Eyes QHS  . lisinopril  20 mg Oral Daily  . meloxicam  15 mg Oral Daily  . oxybutynin  5 mg Oral BID  . pantoprazole  40 mg Oral QAC breakfast  . QUEtiapine  25 mg Oral QHS   Continuous Infusions: . sodium chloride 75 mL/hr at 03/15/17 2125  . methocarbamol (ROBAXIN)  IV       LOS: 1 day     Cordelia Poche, MD [facility] 03/16/2017, 12:03 PM Pager: [phone]  If 7PM-7AM, please contact night-coverage [email or web address] Password [email or web address] 03/16/2017, 12:03 PM

## 2017-03-17 ENCOUNTER — Inpatient Hospital Stay (HOSPITAL_COMMUNITY): Payer: Medicare Other | Admitting: Certified Registered Nurse Anesthetist

## 2017-03-17 ENCOUNTER — Inpatient Hospital Stay (HOSPITAL_COMMUNITY): Payer: Medicare Other

## 2017-03-17 ENCOUNTER — Encounter (HOSPITAL_COMMUNITY): Payer: Self-pay

## 2017-03-17 ENCOUNTER — Encounter (HOSPITAL_COMMUNITY)
Admission: AD | Disposition: A | Discharge status: Skilled Nursing Facility | Payer: Self-pay | Source: Other Acute Inpatient Hospital | Attending: Family Medicine

## 2017-03-17 DIAGNOSIS — I10 Essential (primary) hypertension: Secondary | ICD-10-CM

## 2017-03-17 DIAGNOSIS — S72332A Displaced oblique fracture of shaft of left femur, initial encounter for closed fracture: Secondary | ICD-10-CM

## 2017-03-17 DIAGNOSIS — S72112A Displaced fracture of greater trochanter of left femur, initial encounter for closed fracture: Secondary | ICD-10-CM

## 2017-03-17 DIAGNOSIS — S72009A Fracture of unspecified part of neck of unspecified femur, initial encounter for closed fracture: Secondary | ICD-10-CM

## 2017-03-17 DIAGNOSIS — I700 Atherosclerosis of aorta: Secondary | ICD-10-CM

## 2017-03-17 DIAGNOSIS — L8990 Pressure ulcer of unspecified site, unspecified stage: Secondary | ICD-10-CM

## 2017-03-17 HISTORY — DX: Atherosclerosis of aorta: I70.0

## 2017-03-17 HISTORY — PX: ORIF FEMUR FRACTURE: SHX2119

## 2017-03-17 LAB — COMPREHENSIVE METABOLIC PANEL
ALT: 14 U/L (ref 14–54)
ANION GAP: 7 (ref 5–15)
AST: 23 U/L (ref 15–41)
Albumin: 2.3 g/dL — ABNORMAL LOW (ref 3.5–5.0)
Alkaline Phosphatase: 68 U/L (ref 38–126)
BUN: 10 mg/dL (ref 6–20)
CHLORIDE: 96 mmol/L — AB (ref 101–111)
CO2: 26 mmol/L (ref 22–32)
Calcium: 8.5 mg/dL — ABNORMAL LOW (ref 8.9–10.3)
Creatinine, Ser: 0.57 mg/dL (ref 0.44–1.00)
Glucose, Bld: 90 mg/dL (ref 65–99)
POTASSIUM: 3.8 mmol/L (ref 3.5–5.1)
Sodium: 129 mmol/L — ABNORMAL LOW (ref 135–145)
TOTAL PROTEIN: 6.5 g/dL (ref 6.5–8.1)
Total Bilirubin: 0.8 mg/dL (ref 0.3–1.2)

## 2017-03-17 LAB — CBC
HCT: 27.7 % — ABNORMAL LOW (ref 36.0–46.0)
Hemoglobin: 9 g/dL — ABNORMAL LOW (ref 12.0–15.0)
MCH: 26.9 pg (ref 26.0–34.0)
MCHC: 32.5 g/dL (ref 30.0–36.0)
MCV: 82.9 fL (ref 78.0–100.0)
PLATELETS: 281 10*3/uL (ref 150–400)
RBC: 3.34 MIL/uL — ABNORMAL LOW (ref 3.87–5.11)
RDW: 13.9 % (ref 11.5–15.5)
WBC: 5.7 10*3/uL (ref 4.0–10.5)

## 2017-03-17 LAB — GLUCOSE, CAPILLARY
GLUCOSE-CAPILLARY: 85 mg/dL (ref 65–99)
Glucose-Capillary: 82 mg/dL (ref 65–99)
Glucose-Capillary: 105 mg/dL — ABNORMAL HIGH (ref 65–99)
Glucose-Capillary: 176 mg/dL — ABNORMAL HIGH (ref 65–99)

## 2017-03-17 LAB — PREPARE RBC (CROSSMATCH)

## 2017-03-17 LAB — POCT I-STAT 4, (NA,K, GLUC, HGB,HCT)
GLUCOSE: 96 mg/dL (ref 65–99)
HEMATOCRIT: 47 % — AB (ref 36.0–46.0)
HEMOGLOBIN: 16 g/dL — AB (ref 12.0–15.0)
Potassium: 4.1 mmol/L (ref 3.5–5.1)
SODIUM: 131 mmol/L — AB (ref 135–145)

## 2017-03-17 SURGERY — OPEN REDUCTION INTERNAL FIXATION FEMORAL SHAFT FRACTURE
Anesthesia: General | Laterality: Left

## 2017-03-17 MED ORDER — MENTHOL 3 MG MT LOZG: 1.0000 | LOZENGE | OROMUCOSAL | Status: DC | PRN

## 2017-03-17 MED ORDER — FENTANYL CITRATE (PF) 100 MCG/2ML IJ SOLN
25.0000 ug | INTRAMUSCULAR | Status: DC | PRN
  Administered 2017-03-17 (×2): 25 ug via INTRAVENOUS

## 2017-03-17 MED ORDER — ONDANSETRON HCL 4 MG/2ML IJ SOLN
4.0000 mg | Freq: Four times a day (QID) | INTRAMUSCULAR | Status: DC | PRN
  Administered 2017-03-17: 4 mg via INTRAVENOUS
  Filled 2017-03-17: qty 2

## 2017-03-17 MED ORDER — LACTATED RINGERS IV SOLN
INTRAVENOUS | Status: DC
  Administered 2017-03-17: 50 mL/h via INTRAVENOUS
  Administered 2017-03-17: 13:00:00 via INTRAVENOUS

## 2017-03-17 MED ORDER — ROCURONIUM BROMIDE 10 MG/ML (PF) SYRINGE: PREFILLED_SYRINGE | INTRAVENOUS | Status: DC | PRN

## 2017-03-17 MED ORDER — INSULIN ASPART 100 UNIT/ML ~~LOC~~ SOLN
0.0000 [IU] | Freq: Three times a day (TID) | SUBCUTANEOUS | Status: DC
  Administered 2017-03-18 (×2): 1 [IU] via SUBCUTANEOUS

## 2017-03-17 MED ORDER — BUPIVACAINE HCL (PF) 0.25 % IJ SOLN
INTRAMUSCULAR | Status: AC
  Filled 2017-03-17: qty 30

## 2017-03-17 MED ORDER — ONDANSETRON HCL 4 MG PO TABS: 4.0000 mg | ORAL_TABLET | Freq: Four times a day (QID) | ORAL | Status: DC | PRN

## 2017-03-17 MED ORDER — FENTANYL CITRATE (PF) 250 MCG/5ML IJ SOLN
INTRAMUSCULAR | Status: AC
  Filled 2017-03-17: qty 5

## 2017-03-17 MED ORDER — MIDAZOLAM HCL 2 MG/2ML IJ SOLN
INTRAMUSCULAR | Status: AC
  Filled 2017-03-17: qty 2

## 2017-03-17 MED ORDER — FENTANYL CITRATE (PF) 250 MCG/5ML IJ SOLN
INTRAMUSCULAR | Status: DC | PRN
  Administered 2017-03-17: 25 ug via INTRAVENOUS
  Administered 2017-03-17: 50 ug via INTRAVENOUS
  Administered 2017-03-17: 25 ug via INTRAVENOUS

## 2017-03-17 MED ORDER — ACETAMINOPHEN 325 MG PO TABS
650.0000 mg | ORAL_TABLET | Freq: Four times a day (QID) | ORAL | Status: DC | PRN
  Administered 2017-03-21: 650 mg via ORAL
  Filled 2017-03-17: qty 2

## 2017-03-17 MED ORDER — VECURONIUM BROMIDE 10 MG IV SOLR
INTRAVENOUS | Status: DC | PRN
  Administered 2017-03-17 (×2): 1 mg via INTRAVENOUS

## 2017-03-17 MED ORDER — ASPIRIN EC 325 MG PO TBEC
325.0000 mg | DELAYED_RELEASE_TABLET | Freq: Every day | ORAL | Status: DC
  Administered 2017-03-18 – 2017-03-21 (×4): 325 mg via ORAL
  Filled 2017-03-17 (×4): qty 1

## 2017-03-17 MED ORDER — PHENYLEPHRINE HCL 10 MG/ML IJ SOLN
INTRAMUSCULAR | Status: DC | PRN
  Administered 2017-03-17: 20 ug/min via INTRAVENOUS

## 2017-03-17 MED ORDER — LIDOCAINE 2% (20 MG/ML) 5 ML SYRINGE
INTRAMUSCULAR | Status: AC
  Filled 2017-03-17: qty 5

## 2017-03-17 MED ORDER — PROPOFOL 10 MG/ML IV BOLUS
INTRAVENOUS | Status: AC
  Filled 2017-03-17: qty 20

## 2017-03-17 MED ORDER — DEXAMETHASONE SODIUM PHOSPHATE 10 MG/ML IJ SOLN
INTRAMUSCULAR | Status: DC | PRN
  Administered 2017-03-17: 5 mg via INTRAVENOUS

## 2017-03-17 MED ORDER — LACTATED RINGERS IV SOLN
INTRAVENOUS | Status: DC | PRN
  Administered 2017-03-17: 14:00:00 via INTRAVENOUS

## 2017-03-17 MED ORDER — CEFAZOLIN SODIUM-DEXTROSE 2-4 GM/100ML-% IV SOLN
2.0000 g | INTRAVENOUS | Status: AC
  Administered 2017-03-17: 2 g via INTRAVENOUS
  Filled 2017-03-17: qty 100

## 2017-03-17 MED ORDER — SODIUM CHLORIDE 0.9 % IV SOLN: Freq: Once | INTRAVENOUS | Status: DC

## 2017-03-17 MED ORDER — ROCURONIUM BROMIDE 100 MG/10ML IV SOLN
INTRAVENOUS | Status: DC | PRN
  Administered 2017-03-17: 40 mg via INTRAVENOUS

## 2017-03-17 MED ORDER — DEXAMETHASONE SODIUM PHOSPHATE 10 MG/ML IJ SOLN
INTRAMUSCULAR | Status: AC
Start: 2017-03-17 — End: 2017-03-17
  Filled 2017-03-17: qty 1

## 2017-03-17 MED ORDER — PHENOL 1.4 % MT LIQD: 1.0000 | OROMUCOSAL | Status: DC | PRN

## 2017-03-17 MED ORDER — ONDANSETRON HCL 4 MG/2ML IJ SOLN: 4.0000 mg | Freq: Once | INTRAMUSCULAR | Status: DC | PRN

## 2017-03-17 MED ORDER — PHENYLEPHRINE 40 MCG/ML (10ML) SYRINGE FOR IV PUSH (FOR BLOOD PRESSURE SUPPORT)
PREFILLED_SYRINGE | INTRAVENOUS | Status: AC
Start: 2017-03-17 — End: 2017-03-17
  Filled 2017-03-17: qty 10

## 2017-03-17 MED ORDER — SODIUM CHLORIDE 0.9 % IV SOLN
INTRAVENOUS | Status: DC | PRN
  Administered 2017-03-17: 14:00:00 via INTRAVENOUS

## 2017-03-17 MED ORDER — SODIUM CHLORIDE 0.9 % IV SOLN
INTRAVENOUS | Status: DC
  Administered 2017-03-17 – 2017-03-19 (×4): via INTRAVENOUS

## 2017-03-17 MED ORDER — LACTATED RINGERS IV SOLN: INTRAVENOUS | Status: DC

## 2017-03-17 MED ORDER — 0.9 % SODIUM CHLORIDE (POUR BTL) OPTIME
TOPICAL | Status: DC | PRN
  Administered 2017-03-17: 1000 mL

## 2017-03-17 MED ORDER — PHENYLEPHRINE 40 MCG/ML (10ML) SYRINGE FOR IV PUSH (FOR BLOOD PRESSURE SUPPORT)
PREFILLED_SYRINGE | INTRAVENOUS | Status: AC
  Filled 2017-03-17: qty 30

## 2017-03-17 MED ORDER — DOCUSATE SODIUM 100 MG PO CAPS
100.0000 mg | ORAL_CAPSULE | Freq: Two times a day (BID) | ORAL | Status: DC
  Administered 2017-03-17 – 2017-03-21 (×7): 100 mg via ORAL
  Filled 2017-03-17 (×8): qty 1

## 2017-03-17 MED ORDER — METOCLOPRAMIDE HCL 5 MG/ML IJ SOLN: 5.0000 mg | Freq: Three times a day (TID) | INTRAMUSCULAR | Status: DC | PRN

## 2017-03-17 MED ORDER — METOCLOPRAMIDE HCL 5 MG PO TABS: 5.0000 mg | ORAL_TABLET | Freq: Three times a day (TID) | ORAL | Status: DC | PRN

## 2017-03-17 MED ORDER — FERROUS GLUCONATE 324 (38 FE) MG PO TABS
324.0000 mg | ORAL_TABLET | Freq: Two times a day (BID) | ORAL | Status: DC
  Administered 2017-03-18 – 2017-03-21 (×6): 324 mg via ORAL
  Filled 2017-03-17 (×8): qty 1

## 2017-03-17 MED ORDER — ONDANSETRON HCL 4 MG/2ML IJ SOLN
INTRAMUSCULAR | Status: DC | PRN
  Administered 2017-03-17: 4 mg via INTRAVENOUS

## 2017-03-17 MED ORDER — PHENYLEPHRINE HCL 10 MG/ML IJ SOLN
INTRAMUSCULAR | Status: DC | PRN
  Administered 2017-03-17: 80 ug via INTRAVENOUS

## 2017-03-17 MED ORDER — CEFAZOLIN SODIUM-DEXTROSE 1-4 GM/50ML-% IV SOLN
1.0000 g | Freq: Three times a day (TID) | INTRAVENOUS | Status: AC
  Administered 2017-03-17 – 2017-03-18 (×2): 1 g via INTRAVENOUS
  Filled 2017-03-17 (×3): qty 50

## 2017-03-17 MED ORDER — FENTANYL CITRATE (PF) 100 MCG/2ML IJ SOLN
INTRAMUSCULAR | Status: AC
  Filled 2017-03-17: qty 2

## 2017-03-17 MED ORDER — PROPOFOL 10 MG/ML IV BOLUS
INTRAVENOUS | Status: DC | PRN
  Administered 2017-03-17: 50 mg via INTRAVENOUS

## 2017-03-17 MED ORDER — LIDOCAINE 2% (20 MG/ML) 5 ML SYRINGE
INTRAMUSCULAR | Status: DC | PRN
  Administered 2017-03-17: 50 mg via INTRAVENOUS

## 2017-03-17 MED ORDER — ROCURONIUM BROMIDE 10 MG/ML (PF) SYRINGE
PREFILLED_SYRINGE | INTRAVENOUS | Status: AC
  Filled 2017-03-17: qty 5

## 2017-03-17 MED ORDER — ACETAMINOPHEN 650 MG RE SUPP: 650.0000 mg | Freq: Four times a day (QID) | RECTAL | Status: DC | PRN

## 2017-03-17 MED ORDER — SUGAMMADEX SODIUM 200 MG/2ML IV SOLN
INTRAVENOUS | Status: AC
  Filled 2017-03-17: qty 2

## 2017-03-17 MED ORDER — SODIUM CHLORIDE 0.9 % IV SOLN
10.0000 mL/h | Freq: Once | INTRAVENOUS | Status: DC
Start: 2017-03-17 — End: 2017-03-19

## 2017-03-17 MED ORDER — HYDROCODONE-ACETAMINOPHEN 5-325 MG PO TABS
1.0000 | ORAL_TABLET | Freq: Four times a day (QID) | ORAL | Status: DC | PRN
  Administered 2017-03-17 – 2017-03-18 (×3): 2 via ORAL
  Administered 2017-03-18 (×2): 1 via ORAL
  Administered 2017-03-19 – 2017-03-20 (×4): 2 via ORAL
  Administered 2017-03-20: 1 via ORAL
  Filled 2017-03-17 (×5): qty 2
  Filled 2017-03-17: qty 1
  Filled 2017-03-17 (×3): qty 2
  Filled 2017-03-17: qty 1
  Filled 2017-03-17: qty 2

## 2017-03-17 MED ORDER — ONDANSETRON HCL 4 MG/2ML IJ SOLN
INTRAMUSCULAR | Status: AC
  Filled 2017-03-17: qty 2

## 2017-03-17 MED ORDER — SUGAMMADEX SODIUM 200 MG/2ML IV SOLN
INTRAVENOUS | Status: DC | PRN
  Administered 2017-03-17: 106.2 mg via INTRAVENOUS

## 2017-03-17 SURGICAL SUPPLY — 86 items
BANDAGE ACE 4X5 VEL STRL LF (GAUZE/BANDAGES/DRESSINGS) IMPLANT
BANDAGE ACE 6X5 VEL STRL LF (GAUZE/BANDAGES/DRESSINGS) IMPLANT
BIT DRILL 110X2.5XQCK CNCT (BIT) ×1 IMPLANT
BIT DRILL 2.5 (BIT) ×2
BIT DRILL 2.5X2.75 QC CALB (BIT) ×3 IMPLANT
BIT DRL 110X2.5XQCK CNCT (BIT) ×1
BLADE CLIPPER SURG (BLADE) IMPLANT
BLADE SURG 10 STRL SS (BLADE) ×3 IMPLANT
BNDG GAUZE ELAST 4 BULKY (GAUZE/BANDAGES/DRESSINGS) IMPLANT
CABLE CERLAGE W/CRIMP 1.8 (Cable) ×10 IMPLANT
CABLE CERLAGE W/CRIMP 1.8MM (Cable) ×5 IMPLANT
CLEANER TIP ELECTROSURG 2X2 (MISCELLANEOUS) ×3 IMPLANT
COVER MAYO STAND STRL (DRAPES) ×3 IMPLANT
COVER SURGICAL LIGHT HANDLE (MISCELLANEOUS) ×3 IMPLANT
CUFF TOURNIQUET SINGLE 34IN LL (TOURNIQUET CUFF) IMPLANT
DRAPE C-ARM 42X72 X-RAY (DRAPES) IMPLANT
DRAPE IMP U-DRAPE 54X76 (DRAPES) ×3 IMPLANT
DRAPE INCISE IOBAN 66X45 STRL (DRAPES) ×3 IMPLANT
DRAPE ORTHO SPLIT 77X108 STRL (DRAPES) ×4
DRAPE SURG ORHT 6 SPLT 77X108 (DRAPES) ×2 IMPLANT
DRAPE U-SHAPE 47X51 STRL (DRAPES) ×3 IMPLANT
DRSG ADAPTIC 3X8 NADH LF (GAUZE/BANDAGES/DRESSINGS) IMPLANT
DRSG MEPILEX BORDER 4X12 (GAUZE/BANDAGES/DRESSINGS) ×3 IMPLANT
DRSG MEPILEX BORDER 4X8 (GAUZE/BANDAGES/DRESSINGS) ×6 IMPLANT
DRSG PAD ABDOMINAL 8X10 ST (GAUZE/BANDAGES/DRESSINGS) IMPLANT
DURAPREP 26ML APPLICATOR (WOUND CARE) ×3 IMPLANT
ELECT BLADE 4.0 EZ CLEAN MEGAD (MISCELLANEOUS) ×3
ELECT REM PT RETURN 9FT ADLT (ELECTROSURGICAL) ×3
ELECTRODE BLDE 4.0 EZ CLN MEGD (MISCELLANEOUS) ×1 IMPLANT
ELECTRODE REM PT RTRN 9FT ADLT (ELECTROSURGICAL) ×1 IMPLANT
EVACUATOR 1/8 PVC DRAIN (DRAIN) IMPLANT
GAUZE SPONGE 4X4 12PLY STRL (GAUZE/BANDAGES/DRESSINGS) IMPLANT
GLOVE BIOGEL PI IND STRL 7.5 (GLOVE) ×1 IMPLANT
GLOVE BIOGEL PI IND STRL 8 (GLOVE) ×1 IMPLANT
GLOVE BIOGEL PI INDICATOR 7.5 (GLOVE) ×2
GLOVE BIOGEL PI INDICATOR 8 (GLOVE) ×2
GLOVE ECLIPSE 7.0 STRL STRAW (GLOVE) ×3 IMPLANT
GLOVE ORTHO TXT STRL SZ7.5 (GLOVE) ×3 IMPLANT
GOWN STRL REUS W/ TWL LRG LVL3 (GOWN DISPOSABLE) ×2 IMPLANT
GOWN STRL REUS W/ TWL XL LVL3 (GOWN DISPOSABLE) ×1 IMPLANT
GOWN STRL REUS W/TWL LRG LVL3 (GOWN DISPOSABLE) ×4
GOWN STRL REUS W/TWL XL LVL3 (GOWN DISPOSABLE) ×2
KIT BASIN OR (CUSTOM PROCEDURE TRAY) ×3 IMPLANT
KIT ROOM TURNOVER OR (KITS) ×3 IMPLANT
LOCKPLATE CABLE BUTTON NCP HIP (Orthopedic Implant) ×12 IMPLANT
MANIFOLD NEPTUNE II (INSTRUMENTS) ×3 IMPLANT
NEEDLE 22X1 1/2 (OR ONLY) (NEEDLE) ×3 IMPLANT
NS IRRIG 1000ML POUR BTL (IV SOLUTION) ×3 IMPLANT
PACK ORTHO EXTREMITY (CUSTOM PROCEDURE TRAY) ×3 IMPLANT
PACK UNIVERSAL I (CUSTOM PROCEDURE TRAY) ×3 IMPLANT
PAD ARMBOARD 7.5X6 YLW CONV (MISCELLANEOUS) ×6 IMPLANT
PAD CAST 4YDX4 CTTN HI CHSV (CAST SUPPLIES) IMPLANT
PADDING CAST COTTON 4X4 STRL (CAST SUPPLIES)
PADDING CAST COTTON 6X4 STRL (CAST SUPPLIES) IMPLANT
PLATE PROXIMAL FEMUR 12H RT (Plate) ×3 IMPLANT
PLATE TROCHANTER 64 LEFT NARRW (Plate) ×3 IMPLANT
SCREW CORT FT 32X3.5XNONLOCK (Screw) ×1 IMPLANT
SCREW CORTICAL 3.5MM  20MM (Screw) ×2 IMPLANT
SCREW CORTICAL 3.5MM  28MM (Screw) ×2 IMPLANT
SCREW CORTICAL 3.5MM  32MM (Screw) ×2 IMPLANT
SCREW CORTICAL 3.5MM 20MM (Screw) ×1 IMPLANT
SCREW CORTICAL 3.5MM 28MM (Screw) ×1 IMPLANT
SCREW NCB 5.0MMX24 (Screw) ×6 IMPLANT
SCREW NCB 5.0X36MM (Screw) ×12 IMPLANT
SCREW NCB 5.0X38 (Screw) ×3 IMPLANT
SPONGE LAP 18X18 X RAY DECT (DISPOSABLE) ×9 IMPLANT
STAPLER VISISTAT 35W (STAPLE) IMPLANT
STOCKINETTE IMPERVIOUS LG (DRAPES) ×3 IMPLANT
SUCTION FRAZIER HANDLE 10FR (MISCELLANEOUS) ×2
SUCTION TUBE FRAZIER 10FR DISP (MISCELLANEOUS) ×1 IMPLANT
SUT ETHIBOND 2 0 V5 (SUTURE) ×3 IMPLANT
SUT VIC AB 0 CT1 27 (SUTURE) ×6
SUT VIC AB 0 CT1 27XBRD ANBCTR (SUTURE) ×3 IMPLANT
SUT VIC AB 1 CT1 27 (SUTURE) ×2
SUT VIC AB 1 CT1 27XBRD ANBCTR (SUTURE) ×1 IMPLANT
SUT VIC AB 1 CTX 36 (SUTURE) ×6
SUT VIC AB 1 CTX36XBRD ANBCTR (SUTURE) ×3 IMPLANT
SUT VIC AB 2-0 CT1 27 (SUTURE) ×2
SUT VIC AB 2-0 CT1 TAPERPNT 27 (SUTURE) ×1 IMPLANT
SYR CONTROL 10ML LL (SYRINGE) ×3 IMPLANT
TOWEL OR 17X24 6PK STRL BLUE (TOWEL DISPOSABLE) ×3 IMPLANT
TOWEL OR 17X26 10 PK STRL BLUE (TOWEL DISPOSABLE) ×3 IMPLANT
TUBE CONNECTING 12'X1/4 (SUCTIONS) ×1
TUBE CONNECTING 12X1/4 (SUCTIONS) ×2 IMPLANT
WATER STERILE IRR 1000ML POUR (IV SOLUTION) ×6 IMPLANT
YANKAUER SUCT BULB TIP NO VENT (SUCTIONS) ×3 IMPLANT

## 2017-03-17 NOTE — Progress Notes (Signed)
   Subjective:   Procedure(s) (LRB): Open Reduction Internal Fixation, Plate Left Femur Fracture, Zimmer Cable Plate, Open Hip Reduction, Possible Hemiarthroplasty Revision Long Stem Depuy (Left) Patient reports pain as moderate.    Objective: Vital signs in last 24 hours: Temp:  [98.6 F (37 C)-99.1 F (37.3 C)] 98.6 F (37 C) (11/21 0610) Pulse Rate:  [76-88] 88 (11/21 0610) Resp:  [18] 18 (11/21 0610) BP: (117-147)/(49-57) 139/56 (11/21 0610) SpO2:  [90 %-94 %] 92 % (11/21 0610)  Intake/Output from previous day: 11/20 0701 - 11/21 0700 In: 1571.3 [P.O.:360; I.V.:1211.3] Out: 1500 [Urine:1500] Intake/Output this shift: No intake/output data recorded.  Recent Labs    03/15/17 1447 03/16/17 0541  HGB 10.7* 8.8*   Recent Labs    03/15/17 1447 03/16/17 0541  WBC 5.5 6.6  RBC 4.01 3.30*  HCT 33.1* 27.2*  PLT 297 251   Recent Labs    03/16/17 0541 03/16/17 1529  NA 127* 125*  K 4.0 3.8  CL 98* 95*  CO2 25 24  BUN 15 14  CREATININE 0.68 0.78  GLUCOSE 103* 120*  CALCIUM 8.2* 8.4*   Recent Labs    03/15/17 1447 03/16/17 1807  INR 1.03 1.04    Neurologically intact No results found.  Assessment/Plan:   Procedure(s) (LRB): Open Reduction Internal Fixation, Plate Left Femur Fracture, Zimmer Cable Plate, Open Hip Reduction, Possible Hemiarthroplasty Revision Long Stem Depuy (Left) Plan:  Surgery today. NPO  [name] 03/17/2017, 7:48 AM

## 2017-03-17 NOTE — Progress Notes (Signed)
Orthopedic Tech Progress Note Patient Details:  [name] 05/11/1933 [id number]  Patient ID: [name], female   DOB: 24-Sep-1933, 83 y.o.   MRN: [id number] Pt cant have ohf due to age restrictions  [name] 03/17/2017, 11:08 PM

## 2017-03-17 NOTE — Op Note (Signed)
Preop diagnosis: Left shaft fracture, closed.  Old greater trochanteric fracture 7 cm subsidence longstem Hemi- arthroplasty.  Postop diagnosis: same  Procedure right hip Girdlestone procedure.  Removal of old subsided hemiarthroplasty cables.  Adduction internal fixation of trochanteric fracture femoral shaft fracture the long plate multiple screws and cables  Surgeon: Rodell Perna MD  Assistant: Benjiman Core PA-C necessary and present for the entire procedure  Anesthesia: General  EBL: See anesthetic record  Brief history: This 83 year old female had a femoral neck fracture 4 years ago knee arthroplasty placed.  He lives alone has had multiple falls admitted after original procedure fracture below the hemiarthroplasty and had revision with a 8 inch 12 mm Depuy fully porous-coated revision stem that fit tightly into the canal.  Since that time she has had multiple falls every month still lives alone is transferred down from [place] after x-rays taken in the interval showed that she had a trochanteric fracture that had migrated more than 8 cm proximally and extended below the lesser trochanteric region.  The prosthesis has settled down 7-8 cm down to the level of the cables pushing the 3 cables together with the erosion of all the proximal bone along the medial calcar and midshaft femur fracture with the stem out the canal this was an 8 inch stem.  Femur fracture was closed and angulated.  It appeared that the trochanter had been off for a long period of time that stem which was loose had slowly been eroding down through the bone centimeters.  Had several other orthopedic surgeons look at the films preoperative planning and plan was for attempt at revision with a 10 inch stem plate fixation fracture possibly the greater trochanter if it could be brought down.  Extensive discussion was held with the family about all surgical options for fracture or perform the hemiarthroplasty due to extensive bone loss  proximally migrated greater trochanter.  Procedure: Induction of general anesthesia patient was placed in the lateral position with an axillary roll.  Marked 7 frame was used for lateral positioning.  Drapes and sheets Steri-Drape x2 were applied and impervious stockinette Coban prepped all the way down below the knee.  Old incision had been marked and timeout procedure was completed preoperative antibiotics given.  Old incision was opened where to been marked with a skin marker extended down almost to the level of the patella.  Vastus lateralis was split anteriorly elevated off the femur the femur fracture was reduced and held with self-retaining clamps requiring Ricard Dillon PA-C for assistance and aligning the fracture fragment which had significantly shortened.  Good length of the fracture and reduction.  The greater trochanter was proximal but have almost completely filled with scar tissue difficult to identify.  C-arm had to be used to identify the acetabulum scar tissue was resected out of the acetabulum.  Trochanter was mobilized and brought down a large long Biomet plate had the trochanteric attachment and attached to it proximally adjusted under C arm screws were placed in the trochanter.  There was no calcar in the proximal cavity was completely filled with scar tissue soft bone.  The distal femur was soft as well multiple cables were passed attaching it to the plate with the small blue screw cable attachments placed into the screws of the plate passing the cables tightening and then down holding the plate lateral cortex of the femur.  Prior to placing the long plate the hemiarthoplastem stem was removed that had  rotated 90 degrees and was  removed  using extractor and a hammer and although it would rotate it still was difficult to remove from the bone.  There was no intramedullary cavity of the proximal portion of the bone.  Long plate was applied plasty was removed clamped cabled as described above.  It  was difficult to visualize the bone due to severe osteopenia despite having the sterilely draped C-arm directly up against the femur.  Multiple bicortical screws were placed through the plate measurements and were 36 mm.  Combination of 3.5 screws were used in the trochanter as well as some 5 mm screws and 5 mm screws through the plate.  Mobilizing the trochanter down to and cleaning out the acetabulum of scar tissue there was insufficient cortical bone for placement of a new stem proximal tube of the femur above the fracture revision stem placement in the distal femoral shaft was soft with gentle application of bone clamps cortex tended to crumble.  She falls with at least 6 falls in the last month reported felt that revision stem placed the proximal half of the femur mushy bone.  Girdlestone arthroplasty was felt to be the best choice with stabilization of the fracture with multiple cables and screws solid with plate placed with distal screws in the bone holes cortical screws in the midportion of the plate and cables wound was copiously irrigated vastus lateralis was replaced back with #1 Vicryl sutures tensor fascia was closed 2-0 Vicryl subtendinous tissue and skin staple closure.  Allowed to scar in with a Girdlestone procedure.  Discussed with the patient's two daughters the procedure that was performed after the surgery was completed.  6 weeks and then gradual progressive weightbearing.  Bone quality is too poor for earlier weightbearing and she is at significant risk for falling due to balance problems and weakness.  Patient was initially lateral position at the beginning of the case she had a sacral pad present that have been applied by nursing staff this was removed was a 1-2 cm pressure ulcer that extended down the sacrum small amount of bleeding this was cleaned with Betadine sacral pad applied at the end of the case.  Wound care nursing consulted postoperatively for  Care of her pressure ulcer.

## 2017-03-17 NOTE — Interval H&P Note (Signed)
History and Physical Interval Note:  03/17/2017 12:24 PM  [name]  has presented today for surgery, with the diagnosis of Left Femur Fracture, Hip Dislocation  The various methods of treatment have been discussed with the patient and family. After consideration of risks, benefits and other options for treatment, the patient has consented to  Procedure(s): Open Reduction Internal Fixation, Plate Left Femur Fracture, Zimmer Cable Plate, Open Hip Reduction, Possible Hemiarthroplasty Revision Long Stem Depuy (Left) as a surgical intervention .  The patient's history has been reviewed, patient examined, no change in status, stable for surgery.  I have reviewed the patient's chart and labs.  Questions were answered to the patient's satisfaction.     Marybelle Killings

## 2017-03-17 NOTE — Transfer of Care (Signed)
Immediate Anesthesia Transfer of Care Note  Patient: [name]  Procedure(s) Performed: REMOVAL OF HEMIARTHROPLASTY, GIRDLESTONE, OPEN REDUCTION INTERNAL FIXATION OF FEMUR FRACTURE, PLATING OF FEMUR FRACTURE (Left )  Patient Location: PACU  Anesthesia Type:General  Level of Consciousness: awake and alert   Airway & Oxygen Therapy: Patient Spontanous Breathing and Patient connected to nasal cannula oxygen  Post-op Assessment: Report given to RN and Post -op Vital signs reviewed and stable  Post vital signs: Reviewed and stable  Last Vitals:  Vitals:   03/17/17 0610 03/17/17 1708  BP: (!) 139/56 (!) 156/72  Pulse: 88 82  Resp: 18 19  Temp: 37 C (!) 36.2 C  SpO2: 92% 96%    Last Pain:  Vitals:   03/17/17 1708  TempSrc:   PainSc: 0-No pain      Patients Stated Pain Goal: 3 (75/88/32 5498)  Complications: No apparent anesthesia complications

## 2017-03-17 NOTE — Progress Notes (Addendum)
1135 Report given to Manuela Schwartz in short stay. Patient accompanied by her daughter.  1800 Received patient from PACU. Alert and oriented x4. L hip dressing dry and intact, LLE externally rotated and shorter.  Stage 3 pressure sacral ulcer noted to the coccyx, measured and wet to dry dressing applied. Repositioned patient to right side. Per patient, she has had the pressure ulcer for a long time.

## 2017-03-17 NOTE — Anesthesia Preprocedure Evaluation (Addendum)
Anesthesia Evaluation  Patient identified by MRN, date of birth, ID band Patient awake    Reviewed: Allergy & Precautions, H&P , Patient's Chart, lab work & pertinent test results, reviewed documented beta blocker date and time   Airway Mallampati: II  TM Distance: >3 FB Neck ROM: full    Dental no notable dental hx.    Pulmonary former smoker,    Pulmonary exam normal breath sounds clear to auscultation       Cardiovascular hypertension,  Rhythm:regular Rate:Normal     Neuro/Psych    GI/Hepatic   Endo/Other  diabetes  Renal/GU      Musculoskeletal   Abdominal   Peds  Hematology  (+) anemia ,   Anesthesia Other Findings HTN Low Hb No Sx of SOB or CP; limited exercise capacity Chest clear  Echo 1-14  - Left ventricle: The cavity size was normal. Wall thickness was increased in a pattern of moderate LVH. The estimated  ejection fraction was 50%. - Aortic valve: There was very mild stenosis.  Reproductive/Obstetrics                           Anesthesia Physical Anesthesia Plan  ASA: II  Anesthesia Plan: General   Post-op Pain Management:    Induction: Intravenous  PONV Risk Score and Plan:   Airway Management Planned: Oral ETT  Additional Equipment:   Intra-op Plan:   Post-operative Plan: Extubation in OR  Informed Consent: I have reviewed the patients History and Physical, chart, labs and discussed the procedure including the risks, benefits and alternatives for the proposed anesthesia with the patient or authorized representative who has indicated his/her understanding and acceptance.   Dental Advisory Given  Plan Discussed with: CRNA and Surgeon  Anesthesia Plan Comments: (  )        Anesthesia Quick Evaluation

## 2017-03-17 NOTE — Progress Notes (Signed)
Patient ID: [name], female   DOB: [birth year], 83 y.o.   MRN: [id number]   In the OR while positioning for surgery we noticed that patient had a large bleeding sacral ulcer.  Patient did have a dressing on and new one was applied immediately before surgery and again after.  We were not aware of this ulcer.  After reviewing notes in chart this had not been documented.  Postop I did order stat wound care RN consult.  Spoke with patient's nurse Mendel Ryder on [place] along with charge nurse Remo Lipps. Advised them of the problem.  I also stated that safety portal should be done.  Mendel Ryder RN advised me that she spoke with patient and daughter and they said the ulcer had been a problem for a long time.  Wound care RN not available this evening. Lindsey advised wet to dry dressing applied and wound care RN should evaluate patient tomorrow.

## 2017-03-17 NOTE — Progress Notes (Signed)
Called Dr Lorin Mercy regarding external rotation with shortening to LLE- he is aware and due to nature of surgery there is no concern.

## 2017-03-17 NOTE — H&P (View-Only) (Signed)
   Subjective:   Procedure(s) (LRB): Open Reduction Internal Fixation, Plate Left Femur Fracture, Zimmer Cable Plate, Open Hip Reduction, Possible Hemiarthroplasty Revision Long Stem Depuy (Left) Patient reports pain as moderate.    Objective: Vital signs in last 24 hours: Temp:  [98.6 F (37 C)-99.1 F (37.3 C)] 98.6 F (37 C) (11/21 0610) Pulse Rate:  [76-88] 88 (11/21 0610) Resp:  [18] 18 (11/21 0610) BP: (117-147)/(49-57) 139/56 (11/21 0610) SpO2:  [90 %-94 %] 92 % (11/21 0610)  Intake/Output from previous day: 11/20 0701 - 11/21 0700 In: 1571.3 [P.O.:360; I.V.:1211.3] Out: 1500 [Urine:1500] Intake/Output this shift: No intake/output data recorded.  Recent Labs    03/15/17 1447 03/16/17 0541  HGB 10.7* 8.8*   Recent Labs    03/15/17 1447 03/16/17 0541  WBC 5.5 6.6  RBC 4.01 3.30*  HCT 33.1* 27.2*  PLT 297 251   Recent Labs    03/16/17 0541 03/16/17 1529  NA 127* 125*  K 4.0 3.8  CL 98* 95*  CO2 25 24  BUN 15 14  CREATININE 0.68 0.78  GLUCOSE 103* 120*  CALCIUM 8.2* 8.4*   Recent Labs    03/15/17 1447 03/16/17 1807  INR 1.03 1.04    Neurologically intact No results found.  Assessment/Plan:   Procedure(s) (LRB): Open Reduction Internal Fixation, Plate Left Femur Fracture, Zimmer Cable Plate, Open Hip Reduction, Possible Hemiarthroplasty Revision Long Stem Depuy (Left) Plan:  Surgery today. NPO  Marybelle Killings 03/17/2017, 7:48 AM

## 2017-03-17 NOTE — Progress Notes (Signed)
  PROGRESS NOTE  [name] GYK:[id number] DOB: January 05, 1934 DOA: 03/15/2017 PCP: Medicine, [facility]  Brief Narrative: 55yow transferred from [facility] for treatment of left hip fracture s/p mechanical fall at home.  Assessment/Plan Left hip fracture/dislocated left hip prosthesis with fracture of proximal femoral shaft. - s/p surgery 11/21. Management per orthopedics  Hyponatremia, chronic to at least 2014, on Elavil, Celexa, Seroquel. - no further evaluation or treatment recommended  DM type 2 - CBG stable, continue SSI, resume metformin on discharge  Essential hypertension - stable, continue lisinopril  Chronic normocytic anemia at baseline - CBC in AM  Thoracic aortic atherosclerosis  - no treatment recommended at this time  DVT prophylaxis: per orthopedics Code Status: full Family Communication:  Disposition Plan: pending PT evaluation    [name], MD  [facility] Direct contact: [phone] --Via amion app OR  --[email or web address]; password [email or web address]  7PM-7AM contact night coverage as above 03/17/2017, 6:33 PM  LOS: 2 days   Consultants:  orthopedics  Procedures:    Antimicrobials:    Interval history/Subjective: Feels ok. Breathing ok.  Objective: Vitals:  Vitals:   03/17/17 1723 03/17/17 1732  BP: 125/63 106/82  Pulse: 75 72  Resp: (!) 30 13  Temp:  97.7 F (36.5 C)  SpO2: 99% 98%    Exam:  Constitutional:  . Appears calm and comfortable Respiratory:  . CTA bilaterally, no w/r/r.  . Respiratory effort normal.  Cardiovascular:  . RRR, no m/r/g Psychiatric:  . Mental status o Mood, affect appropriate   I have personally reviewed the following:   Labs:  Na 125 >> 129. Remainder CMP unremarkable  Hgb stable 9.0  Scheduled Meds: . amitriptyline  25 mg Oral Daily  . [START ON 03/18/2017] aspirin EC  325 mg Oral Q breakfast  . docusate sodium  100 mg Oral BID  . dorzolamide-timolol  1 drop Both Eyes BID  .  feeding supplement (GLUCERNA SHAKE)  237 mL Oral BID BM  . fentaNYL      . ferrous gluconate  325 mg Oral BID WC  . [START ON 03/18/2017] insulin aspart  0-9 Units Subcutaneous TID WC  . latanoprost  1 drop Both Eyes QHS  . lisinopril  20 mg Oral Daily  . meloxicam  15 mg Oral Daily  . oxybutynin  5 mg Oral BID  . pantoprazole  40 mg Oral QAC breakfast  . QUEtiapine  25 mg Oral QHS   Continuous Infusions: . sodium chloride    . sodium chloride    . sodium chloride 85 mL/hr at 03/17/17 1808  .  ceFAZolin (ANCEF) IV    . lactated ringers 50 mL/hr (03/17/17 1202)  . methocarbamol (ROBAXIN)  IV      Principal Problem:   Hip fracture (HCC) Active Problems:   Hypertension   Diabetes mellitus with complication (HCC)   Hyponatremia   Anemia, chronic disease   Thoracic aortic atherosclerosis ([place])   LOS: 2 days

## 2017-03-17 NOTE — Progress Notes (Signed)
Pt complaining of mouth being dry. Mouth care provided. Educated pt on NPO status at this time.

## 2017-03-17 NOTE — Anesthesia Procedure Notes (Signed)
Procedure Name: Intubation Date/Time: 03/17/2017 1:23 PM Performed by: Scheryl Darter, CRNA Pre-anesthesia Checklist: Patient identified, Emergency Drugs available, Suction available and Patient being monitored Patient Re-evaluated:Patient Re-evaluated prior to induction Oxygen Delivery Method: Circle System Utilized Preoxygenation: Pre-oxygenation with 100% oxygen Induction Type: IV induction Ventilation: Mask ventilation without difficulty Laryngoscope Size: Miller and 3 Grade View: Grade I Tube type: Oral Tube size: 7.0 mm Number of attempts: 1 Airway Equipment and Method: Stylet and Oral airway Placement Confirmation: ETT inserted through vocal cords under direct vision,  positive ETCO2 and breath sounds checked- equal and bilateral Tube secured with: Tape Dental Injury: Teeth and Oropharynx as per pre-operative assessment

## 2017-03-18 DIAGNOSIS — S72002A Fracture of unspecified part of neck of left femur, initial encounter for closed fracture: Secondary | ICD-10-CM

## 2017-03-18 LAB — CBC
HCT: 26.9 % — ABNORMAL LOW (ref 36.0–46.0)
Hemoglobin: 9.1 g/dL — ABNORMAL LOW (ref 12.0–15.0)
MCH: 28.7 pg (ref 26.0–34.0)
MCHC: 34.2 g/dL (ref 30.0–36.0)
MCV: 83.8 fL (ref 78.0–100.0)
PLATELETS: 211 10*3/uL (ref 150–400)
RBC: 3.21 MIL/uL — ABNORMAL LOW (ref 3.87–5.11)
RDW: 13.5 % (ref 11.5–15.5)
WBC: 6.5 10*3/uL (ref 4.0–10.5)

## 2017-03-18 LAB — BASIC METABOLIC PANEL
Anion gap: 6 (ref 5–15)
BUN: 13 mg/dL (ref 6–20)
CO2: 26 mmol/L (ref 22–32)
CREATININE: 0.59 mg/dL (ref 0.44–1.00)
Calcium: 7.5 mg/dL — ABNORMAL LOW (ref 8.9–10.3)
Chloride: 95 mmol/L — ABNORMAL LOW (ref 101–111)
GFR calc Af Amer: >60 mL/min (ref >60)
GLUCOSE: 118 mg/dL — AB (ref 65–99)
Potassium: 4.1 mmol/L (ref 3.5–5.1)
SODIUM: 127 mmol/L — AB (ref 135–145)

## 2017-03-18 LAB — GLUCOSE, CAPILLARY
GLUCOSE-CAPILLARY: 127 mg/dL — AB (ref 65–99)
GLUCOSE-CAPILLARY: 121 mg/dL — AB (ref 65–99)
GLUCOSE-CAPILLARY: 129 mg/dL — AB (ref 65–99)
GLUCOSE-CAPILLARY: 115 mg/dL — AB (ref 65–99)

## 2017-03-18 NOTE — Progress Notes (Signed)
1100 paged [facility] RN, no response.  Switched regular bed to  [employer]. Wet to dry dressing applied to stage 3 pressure ulcer to coccyx. Repositioned pt to sides every 2 hrs.

## 2017-03-18 NOTE — Progress Notes (Signed)
   Subjective:  Patient reports pain as moderate.  Stable.  Objective:   VITALS:   Vitals:   03/17/17 1800 03/17/17 2137 03/17/17 2352 03/18/17 0458  BP: (!) 159/54 124/64 (!) 101/50 (!) 98/35  Pulse: 83 84 86 68  Resp: 16 17 18 19   Temp: 98.2 F (36.8 C) 97.8 F (36.6 C) 98.3 F (36.8 C) 98.4 F (36.9 C)  TempSrc: Oral Oral Oral Oral  SpO2: 96% 99% 95% 98%  Weight:      Height:        Neurologically intact Neurovascular intact Sensation intact distally Intact pulses distally Dorsiflexion/Plantar flexion intact Incision: dressing C/D/I and no drainage No cellulitis present Compartment soft   Lab Results  Component Value Date   WBC 6.5 03/18/2017   HGB 9.1 (L) 03/18/2017   HCT 26.9 (L) 03/18/2017   MCV 83.8 03/18/2017   PLT 211 03/18/2017     Assessment/Plan:  1 Day Post-Op   - Expected postop acute blood loss anemia - will monitor for symptoms - Up with PT/OT - DVT ppx - SCDs, ambulation, aspirin - NWB operative extremity - Pain control - will need SNF  Eduard Roux 03/18/2017, 9:22 AM [phone]

## 2017-03-18 NOTE — Evaluation (Addendum)
Physical Therapy Evaluation Patient Details Name: [name] MRN: [id number] DOB: 1934-01-06 Today's Date: 03/18/2017   History of Present Illness  Pt is an 83 y/o female admitted secondary to L hip fracture from fall. S/p ORIF of L femoral shaft on 11/21. Pt also found to have sacral pressure wound. PMH includes heart murmurs, HTN, DM, bells palsy, depression, and L hemi hip arthroplasty X 2.   Clinical Impression  Pt is s/p surgery above with deficits below. PTA, pt was using RW, however, daughters report pt was limited to transferring to potty chair and back to lift chair. Upon eval, pt limited by weakness, post op pain, and decreased balance. Unable to fully stand with RW with max A +2 and attempted X 2. Transferred to recliner using squat pivot with total A +2 and a 3rd person to maintain NWB on LLE. Pt currently high fall risk. Recommending SNF at d/c to increase independence and safety with functional mobility.     Follow Up Recommendations SNF;Supervision/Assistance - 24 hour    Equipment Recommendations  None recommended by PT    Recommendations for Other Services       Precautions / Restrictions Precautions Precautions: Fall Precaution Comments: Multiple falls.  Restrictions Weight Bearing Restrictions: Yes LLE Weight Bearing: Non weight bearing(strict. )      Mobility  Bed Mobility Overal bed mobility: Needs Assistance Bed Mobility: Supine to Sit     Supine to sit: Max assist;+2 for physical assistance     General bed mobility comments: Max A +2 for bed mobility. Required assist with trunk elevation and scooting hips to EOB. Pt with posterior lean upon sitting and required cues and assist for upright.   Transfers Overall transfer level: Needs assistance Equipment used: Rolling walker (2 wheeled);2 person hand held assist Transfers: Sit to/from [employer] Sit to Stand: Max assist;+2 physical assistance   Squat pivot transfers: Total assist;+2  physical assistance     General transfer comment: Max A +2 to attempt standing. Attempted standing X 2 this session. Requried PTs foot under LLE to ensure NWB. Pt unable to stand upright with RLE only. Performed squat pivot to recliner and had 3rd person assist with LLE management to ensure NWB was maintained. Total A +2 to maintain.   Ambulation/Gait             General Gait Details: Unsafe to attempt   Stairs            Wheelchair Mobility    Modified Rankin (Stroke Patients Only)       Balance Overall balance assessment: Needs assistance Sitting-balance support: Bilateral upper extremity supported;Feet supported Sitting balance-Leahy Scale: Poor Sitting balance - Comments: Posterior lean in sitting. Required at least min assist to maintain sitting balance.  Postural control: Posterior lean Standing balance support: Bilateral upper extremity supported;During functional activity Standing balance-Leahy Scale: Zero Standing balance comment: Total assist +2 to transfer to chair.                              Pertinent Vitals/Pain Pain Assessment: Faces Faces Pain Scale: Hurts whole lot Pain Location: LLE  Pain Descriptors / Indicators: Operative site guarding;Sharp Pain Intervention(s): Limited activity within patient's tolerance;Monitored during session;Repositioned    Home Living Family/patient expects to be discharged to:: Skilled nursing facility                      Prior Function Level of  Independence: Needs assistance   Gait / Transfers Assistance Needed: Family reports she only went to potty chair and back to lift chair.            Hand Dominance        Extremity/Trunk Assessment   Upper Extremity Assessment Upper Extremity Assessment: Generalized weakness    Lower Extremity Assessment Lower Extremity Assessment: Generalized weakness;LLE deficits/detail LLE Deficits / Details: Externally rotated and abducted at baseline.  Per RN this is normal as they did not address hip joint this admission. Very painful with movement.     Cervical / Trunk Assessment Cervical / Trunk Assessment: Kyphotic  Communication   Communication: No difficulties  Cognition Arousal/Alertness: Awake/alert Behavior During Therapy: WFL for tasks assessed/performed Overall Cognitive Status: Within Functional Limits for tasks assessed                                        General Comments General comments (skin integrity, edema, etc.): Pt's daughters present in room. Educated about need for SNF given current limitations and pt agreeable.     Exercises     Assessment/Plan    PT Assessment Patient needs continued PT services  PT Problem List Decreased strength;Decreased range of motion;Decreased activity tolerance;Decreased balance;Decreased mobility;Decreased coordination;Decreased knowledge of use of DME;Decreased knowledge of precautions;Pain       PT Treatment Interventions Functional mobility training;Therapeutic activities;Therapeutic exercise;Balance training;Neuromuscular re-education;Patient/family education    PT Goals (Current goals can be found in the Care Plan section)  Acute Rehab PT Goals Patient Stated Goal: to get better  PT Goal Formulation: With patient Time For Goal Achievement: 04/01/17 Potential to Achieve Goals: Fair    Frequency Min 3X/week   Barriers to discharge        Co-evaluation               AM-PAC PT "6 Clicks" Daily Activity  Outcome Measure Difficulty turning over in bed (including adjusting bedclothes, sheets and blankets)?: Unable Difficulty moving from lying on back to sitting on the side of the bed? : Unable Difficulty sitting down on and standing up from a chair with arms (e.g., wheelchair, bedside commode, etc,.)?: Unable Help needed moving to and from a bed to chair (including a wheelchair)?: Total Help needed walking in hospital room?: Total Help needed  climbing 3-5 steps with a railing? : Total 6 Click Score: 6    End of Session Equipment Utilized During Treatment: Gait belt Activity Tolerance: Patient tolerated treatment well Patient left: in chair;with call bell/phone within reach;with family/visitor present Nurse Communication: Mobility status PT Visit Diagnosis: Unsteadiness on feet (R26.81);Other abnormalities of gait and mobility (R26.89);Repeated falls (R29.6);Muscle weakness (generalized) (M62.81);Pain Pain - Right/Left: Left Pain - part of body: Leg    Time: 8127-5170 PT Time Calculation (min) (ACUTE ONLY): 28 min   Charges:   PT Evaluation $PT Eval Moderate Complexity: 1 Mod PT Treatments $Therapeutic Activity: 8-22 mins   PT G Codes:        Leighton Ruff, PT, DPT  Acute Rehabilitation Services  Pager: [phone]   Rudean Hitt 03/18/2017, 2:49 PM

## 2017-03-18 NOTE — Progress Notes (Signed)
  PROGRESS NOTE  [name] ERD:[id number] DOB: Jul 07, 1933 DOA: 03/15/2017 PCP: Medicine, [facility]  Brief Narrative: 64yow transferred from [facility] for treatment of left hip fracture s/p mechanical fall at home.  Assessment/Plan Left hip fracture/dislocated left hip prosthesis with fracture of proximal femoral shaft. - s/p surgery 11/21. Management per orthopedics  Hyponatremia, chronic to at least 2014, on Elavil, Celexa, Seroquel. - stable. No further evaluation or treatment recommended  DM type 2 - CBG stable, continue SSI 11/22, resume metformin on discharge  Essential hypertension - stable 11/22, continue lisinopril  Chronic normocytic anemia at baseline - stable  Thoracic aortic atherosclerosis  - no treatment recommended at this time  DVT prophylaxis: per orthopedics, ASA, SCDs Code Status: full Family Communication:  Disposition Plan: SNF   [name], MD  [facility] Direct contact: [phone] --Via amion app OR  --[email or web address]; password [email or web address]  7PM-7AM contact night coverage as above 03/18/2017, 2:52 PM  LOS: 3 days   Consultants:  Orthopedics   Procedures:    Antimicrobials:    Interval history/Subjective: Feels ok today.  Objective: Vitals:  Vitals:   03/17/17 2352 03/18/17 0458  BP: (!) 101/50 (!) 98/35  Pulse: 86 68  Resp: 18 19  Temp: 98.3 F (36.8 C) 98.4 F (36.9 C)  SpO2: 95% 98%    Exam:  Constitutional:   . Appears calm and comfortable Respiratory:  . CTA bilaterally, no w/r/r.  . Respiratory effort normal.  Cardiovascular:  . RRR, no m/r/g Psychiatric:  . Mental status o Mood, affect appropriate  I have personally reviewed the following:   Labs:  CBG stable  Na 125 >> 129 >> 131 >> 127  Hgb stable 9.0 >> 9.1  Scheduled Meds: . amitriptyline  25 mg Oral Daily  . aspirin EC  325 mg Oral Q breakfast  . docusate sodium  100 mg Oral BID  . dorzolamide-timolol  1 drop Both Eyes BID  .  feeding supplement (GLUCERNA SHAKE)  237 mL Oral BID BM  . ferrous gluconate  324 mg Oral BID WC  . insulin aspart  0-9 Units Subcutaneous TID WC  . latanoprost  1 drop Both Eyes QHS  . lisinopril  20 mg Oral Daily  . meloxicam  15 mg Oral Daily  . oxybutynin  5 mg Oral BID  . pantoprazole  40 mg Oral QAC breakfast  . QUEtiapine  25 mg Oral QHS   Continuous Infusions: . sodium chloride    . sodium chloride    . sodium chloride 85 mL/hr at 03/18/17 0520  . lactated ringers 50 mL/hr (03/17/17 1202)  . methocarbamol (ROBAXIN)  IV Stopped (03/18/17 0021)    Principal Problem:   Hip fracture (HCC) Active Problems:   Hypertension   Diabetes mellitus with complication (HCC)   Hyponatremia   Anemia, chronic disease   Thoracic aortic atherosclerosis (HCC)   Pressure injury of skin   LOS: 3 days

## 2017-03-19 DIAGNOSIS — S7292XA Unspecified fracture of left femur, initial encounter for closed fracture: Secondary | ICD-10-CM

## 2017-03-19 DIAGNOSIS — D62 Acute posthemorrhagic anemia: Secondary | ICD-10-CM

## 2017-03-19 DIAGNOSIS — E119 Type 2 diabetes mellitus without complications: Secondary | ICD-10-CM

## 2017-03-19 DIAGNOSIS — S73005A Unspecified dislocation of left hip, initial encounter: Secondary | ICD-10-CM

## 2017-03-19 LAB — BASIC METABOLIC PANEL
Anion gap: 5 (ref 5–15)
BUN: 11 mg/dL (ref 6–20)
CHLORIDE: 97 mmol/L — AB (ref 101–111)
CO2: 24 mmol/L (ref 22–32)
CREATININE: 0.57 mg/dL (ref 0.44–1.00)
Calcium: 7.7 mg/dL — ABNORMAL LOW (ref 8.9–10.3)
GFR calc non Af Amer: >60 mL/min (ref >60)
Glucose, Bld: 92 mg/dL (ref 65–99)
POTASSIUM: 3.9 mmol/L (ref 3.5–5.1)
Sodium: 126 mmol/L — ABNORMAL LOW (ref 135–145)

## 2017-03-19 LAB — CBC
HEMATOCRIT: 22.1 % — AB (ref 36.0–46.0)
Hemoglobin: 7.4 g/dL — ABNORMAL LOW (ref 12.0–15.0)
MCH: 28.4 pg (ref 26.0–34.0)
MCHC: 33.5 g/dL (ref 30.0–36.0)
MCV: 84.7 fL (ref 78.0–100.0)
PLATELETS: 215 10*3/uL (ref 150–400)
RBC: 2.61 MIL/uL — ABNORMAL LOW (ref 3.87–5.11)
RDW: 14.1 % (ref 11.5–15.5)
WBC: 9 10*3/uL (ref 4.0–10.5)

## 2017-03-19 LAB — BPAM RBC
BLOOD PRODUCT EXPIRATION DATE: 201811282359
BLOOD PRODUCT EXPIRATION DATE: 201812022359
BLOOD PRODUCT EXPIRATION DATE: 201812162359
Blood Product Expiration Date: 201811292359
Blood Product Expiration Date: 201812152359
ISSUE DATE / TIME: 201811211304
ISSUE DATE / TIME: 201811211529
ISSUE DATE / TIME: 201811211529
ISSUE DATE / TIME: 201811211304
ISSUE DATE / TIME: 201811211434
UNIT TYPE AND RH: 1700
UNIT TYPE AND RH: 1700
UNIT TYPE AND RH: 7300
Unit Type and Rh: 1700
Unit Type and Rh: 7300

## 2017-03-19 LAB — TYPE AND SCREEN
ABO/RH(D): B NEG
ANTIBODY SCREEN: NEGATIVE
UNIT DIVISION: 0
UNIT DIVISION: 0
Unit division: 0
Unit division: 0
Unit division: 0

## 2017-03-19 LAB — GLUCOSE, CAPILLARY
GLUCOSE-CAPILLARY: 114 mg/dL — AB (ref 65–99)
Glucose-Capillary: 91 mg/dL (ref 65–99)
Glucose-Capillary: 104 mg/dL — ABNORMAL HIGH (ref 65–99)
Glucose-Capillary: 149 mg/dL — ABNORMAL HIGH (ref 65–99)

## 2017-03-19 MED ORDER — POLYETHYLENE GLYCOL 3350 17 G PO PACK
17.0000 g | PACK | Freq: Two times a day (BID) | ORAL | Status: DC
  Administered 2017-03-19 – 2017-03-20 (×3): 17 g via ORAL
  Filled 2017-03-19 (×4): qty 1

## 2017-03-19 MED ORDER — SENNA 8.6 MG PO TABS
1.0000 | ORAL_TABLET | Freq: Every day | ORAL | Status: DC
  Administered 2017-03-19: 8.6 mg via ORAL
  Filled 2017-03-19 (×2): qty 1

## 2017-03-19 NOTE — Care Management Note (Signed)
Case Management Note  Patient Details  Name: [name] MRN: [id number] Date of Birth: 12-Feb-1934  Subjective/Objective:                    Action/Plan:   Expected Discharge Date:                  Expected Discharge Plan:  Skilled Nursing Facility  In-House Referral:  Clinical Social Work  Discharge planning Services     Post Acute Care Choice:    Choice offered to:     DME Arranged:    DME Agency:     HH Arranged:    [place] Agency:     Status of Service:  In process, will continue to follow  If discussed at Long Length of Stay Meetings, dates discussed:    Additional Comments:  Marilu Favre, RN 03/19/2017, 10:55 AM

## 2017-03-19 NOTE — Clinical Social Work Note (Signed)
Clinical Social Work Assessment  Patient Details  Name: [name] MRN: [id number] Date of Birth: 20-Jul-1933  Date of referral:  03/19/17               Reason for consult:  Facility Placement                Permission sought to share information with:  Facility Sport and exercise psychologist, Family Supports Permission granted to share information::  Yes, Verbal Permission Granted  Name::     [name]   Agency::  SNF- [name]  Relationship::  daughter  Contact Information:  [phone]  Housing/Transportation Living arrangements for the past 2 months:  [name] of Information:  Patient, Adult Children Patient Interpreter Needed:  None Criminal Activity/Legal Involvement Pertinent to Current Situation/Hospitalization:  No - Comment as needed Significant Relationships:  Adult Children Lives with:  Self Do you feel safe going back to the place where you live?  Yes Need for family participation in patient care:  Yes (Comment)  Care giving concerns: Patient lives alone in a single family home, has mobility concerns and need supervision which family is not able to provide 24/7 (daughter in [place] works, and other daughter is in [name]). For continued rehab and supervision to improve strength and mobility, SNF placement is recommended.    Social Worker assessment / plan:  CSW met with patient and daughter who lives in [name]. Patient was oriented x4, patient deferred most questions to daughter [name] in [name]. Patient did verbally state that she understood SNF placement was recommended and she would like to return for short term rehab care in [name], as she is from [name]. CSW called daughter [name] and spoke with her to confirm that SNF placement, in [name], was preferred. Patient daughter confirmed, and requested a preference for [name] in [place]. CSW will follow up with [name] for potential SNF placement.  Employment status:  Retired Radiation protection practitioner:  Medicare PT Recommendations:  [name] / Referral to community resources:  [name]  Patient/Family's Response to care:  Patient and family are amenable to SNF placement to support patient in regaining strength and mobility, and receive appropriate supervision. Patient and family would like care to be in [name] closer to patient's home in [name].  Patient/Family's Understanding of and Emotional Response to Diagnosis, Current Treatment, and Prognosis:  Patient and family both understand the patients diagnosis, states she has been to [name] before for another hip surgery and verbally expressed understanding that "short term rehab is the best place." Patient and family understand that treatment and supervision is not available 24 hours at patient home, and that the treatment at a SNF would be best for patient care needs. Patient and family understand prognosis, and daughter in [name] is available if needed for patient. Patient daughter stated that she understand that care coordination and additional support may be needed after patient discharge.   Emotional Assessment Appearance:  Appears stated age Attitude/Demeanor/Rapport:  (Cooperative, Calm) Affect (typically observed):  Accepting, Pleasant, Appropriate, Calm Orientation:  Oriented to Self, Oriented to Place, Oriented to  Time, Oriented to Situation Alcohol / Substance use:   None Psych involvement (Current and /or in the community):  No (Comment)  Discharge Needs  Concerns to be addressed:  Care Coordination, Discharge Planning Concerns Readmission within the last 30 days:  No Current discharge risk:  Lives alone, Dependent with Mobility, Physical Impairment Barriers to Discharge:  Continued Medical Work up, [employer]  H Pranay Hilbun, LCSWA 03/19/2017, 12:19 PM

## 2017-03-19 NOTE — Progress Notes (Signed)
  PROGRESS NOTE  Afrika Brick IWP:[id number] DOB: 1934-02-07 DOA: 03/15/2017 PCP: Medicine, [facility]  Brief Narrative: 50yow transferred from [facility] for treatment of left hip fracture s/p mechanical fall at home.  Assessment/Plan Dislocated left hip prosthesis with fracture of proximal femoral shaft. S/p surgery 11/21.  - clear for discharge per Dr. Lorin Mercy. NWB left leg. He requests ASA 325 mg daily for DVT prophylaxis.  ABLA secondary to surgery, superimposed on anemia of chronic disease with baseline Hgb ~9 - Hgb down today. Asymptomatic. Will repeat CBC in AM. No indication for PRBC at this time.  Hyponatremia, chronic to at least 2014, on Elavil, Celexa, Seroquel. - overall stable, asymptomatic. No further evaluation or treatment recommended  DM type 2 - CBG remains stable, continue SSI 11/23, resume metformin on discharge  Essential hypertension - stable 11/23, continue lisinopril  Thoracic aortic atherosclerosis  - no treatment recommended at this time  Sacral decubitis, present on admission - wound care RN consult  DVT prophylaxis: per orthopedics, ASA, SCDs Code Status: full Family Communication:  Disposition Plan: SNF   [name], MD  [facility] Direct contact: [phone] --Via amion app OR  --[email or web address]; password [email or web address]  7PM-7AM contact night coverage as above 03/19/2017, 12:32 PM  LOS: 4 days   Consultants:  Orthopedics   Procedures:    Antimicrobials:    Interval history/Subjective: Reports constipation, otherwise doing well. Wants to keep foley as she has a sacral decubitus.  Objective: Vitals:  Vitals:   03/19/17 0623 03/19/17 0815  BP: (!) 97/44 139/61  Pulse: 75 86  Resp: 15   Temp: 98.4 F (36.9 C) 98.5 F (36.9 C)  SpO2: 97% 99%    Exam:  Constitutional:   . Appears calm and comfortable Respiratory:  . CTA bilaterally, no w/r/r.  . Respiratory effort normal. Cardiovascular:  . RRR, no  m/r/g Psychiatric:  . Mental status o Mood, affect appropriate  I have personally reviewed the following:   Labs:  CBG stable  Na 125 >> 129 >> 131 >> 127 >> 126  Hgb stable 9.0 >> 9.1 >> 7.4  Scheduled Meds: . amitriptyline  25 mg Oral Daily  . aspirin EC  325 mg Oral Q breakfast  . docusate sodium  100 mg Oral BID  . dorzolamide-timolol  1 drop Both Eyes BID  . feeding supplement (GLUCERNA SHAKE)  237 mL Oral BID BM  . ferrous gluconate  324 mg Oral BID WC  . insulin aspart  0-9 Units Subcutaneous TID WC  . latanoprost  1 drop Both Eyes QHS  . lisinopril  20 mg Oral Daily  . meloxicam  15 mg Oral Daily  . oxybutynin  5 mg Oral BID  . pantoprazole  40 mg Oral QAC breakfast  . polyethylene glycol  17 g Oral BID  . QUEtiapine  25 mg Oral QHS  . senna  1 tablet Oral QHS   Continuous Infusions: . sodium chloride 85 mL/hr at 03/19/17 0438  . methocarbamol (ROBAXIN)  IV Stopped (03/18/17 0021)    Principal Problem:   Femur fracture, left (HCC) Active Problems:   Hip fracture (HCC)   Hypertension   Hyponatremia   Anemia, chronic disease   Acute blood loss anemia   Thoracic aortic atherosclerosis (HCC)   Pressure injury of skin   Dislocated hip, left, initial encounter ([place])   LOS: 4 days

## 2017-03-19 NOTE — Social Work (Addendum)
11:12 am: CSW spoke with patient daughter Lattie Haw about SNF placement and discharge today.   Daughter was apprehensive about her discharging today stating "Dr. Lorin Mercy said he would see her on Monday, and she has a wound that needs taking care of."  CSW will follow up with Dr. Sarajane Jews to see if discharge plan is still for today and touch base about daughter's concerns.   12:44pm: CSW called patient daughter Lattie Haw to follow up about discharge concerns. CSW explained that Dr. Sarajane Jews had followed up with Dr. Lorin Mercy and that patient had been cleared for discharge today per Dr. Lorin Mercy, and that the patient wound was not preventing MD from being able to discharge patient. CSW informed daughter that they would follow up with [facility] in [place] if that was still their placement preference. Daughter again expressed concern about wound not being properly taken care of at [facility], but that it was still family preference for facility placement. CSW will keep daughter informed about [facility]'s response.   CSW followed up with [facility] about placement request sent through the Hub. CSW will continue to follow and support family with any discharge concerns.  2:34pm: CSW spoke with patient's daughter about discharge plans set for tomorrow tentatively. CSW followed up with [facility] and confirmed placement and that they would hold the bed for tomorrow. CSW will update handoff with [facility] on call number and contact name.   [place], [place] Work [phone]

## 2017-03-19 NOTE — NC FL2 (Signed)
[place] LEVEL OF CARE SCREENING TOOL     IDENTIFICATION  Patient Name: [name] Birthdate: 12-Apr-1934 Sex: female Admission Date (Current Location): 03/15/2017  [place] and [place] Number:   [place], [place] and Address:  [facility] [place], [place] [address], [place], [place] [address]      Provider Number: [id number]  Attending Physician Name and Address:  Samuella Cota, MD  Relative Name and Phone Number:  Yael Coppess, daughter, [phone]    Current Level of Care: Hospital Recommended Level of Care: [place] Prior Approval Number:    Date Approved/Denied:   PASRR Number:    Discharge Plan: SNF    Current Diagnoses: Patient Active Problem List   Diagnosis Date Noted  . Thoracic aortic atherosclerosis ([place]) 03/17/2017  . Pressure injury of skin 03/17/2017  . Femur fracture, left ([place]) 06/09/2012  . Acute blood loss anemia 05/24/2012  . Hyponatremia 05/20/2012  . Anemia, chronic disease 05/20/2012  . Left bundle branch block 05/20/2012  . Hip fracture ([place]) 05/19/2012  . Hypertension 05/19/2012  . Diabetes mellitus with complication ([place]) 02/21/2535    Orientation RESPIRATION BLADDER Height & Weight     Self, Time, Situation, Place  O2(nasal canula 2L) Indwelling catheter(Foley) Weight: 117 lb (53.1 kg) Height:  5\' 4"  (162.6 cm)  BEHAVIORAL SYMPTOMS/MOOD NEUROLOGICAL BOWEL NUTRITION STATUS      Continent Diet  AMBULATORY STATUS COMMUNICATION OF NEEDS Skin   Extensive Assist Verbally Surgical wounds                       Personal Care Assistance Level of Assistance  Bathing, Feeding, Dressing Bathing Assistance: Maximum assistance Feeding assistance: Limited assistance Dressing Assistance: Maximum assistance     Functional Limitations Info  Sight, Hearing, Speech Sight Info: Adequate Hearing Info: Adequate Speech Info: Adequate    SPECIAL CARE FACTORS FREQUENCY  OT (By licensed  OT), PT (By licensed PT)     PT Frequency: 3x week OT Frequency: 3x week            Contractures Contractures Info: Not present    Additional Factors Info  Code Status  Psychotropic Medication Code Status Info: Full Code    Psychotropic Medication Info: Seroquel, 25mg , daily at bedtime         Current Medications (03/19/2017):  This is the current hospital active medication list Current Facility-Administered Medications  Medication Dose Route Frequency Provider Last Rate Last Dose  . 0.9 %  sodium chloride infusion  10 mL/hr Intravenous Once Sammie Bench, CRNA      . 0.9 %  sodium chloride infusion   Intravenous Once [place]ell, Tyrone Nine, CRNA      . 0.9 %  sodium chloride infusion   Intravenous Continuous Lanae Crumbly, PA-C 85 mL/hr at 03/19/17 6440    . acetaminophen (TYLENOL) tablet 650 mg  650 mg Oral Q6H PRN Lanae Crumbly, PA-C       Or  . acetaminophen (TYLENOL) suppository 650 mg  650 mg Rectal Q6H PRN Lanae Crumbly, PA-C      . amitriptyline (ELAVIL) tablet 25 mg  25 mg Oral Daily Radene Gunning, NP   25 mg at 03/19/17 1047  . aspirin EC tablet 325 mg  325 mg Oral Q breakfast Lanae Crumbly, PA-C   325 mg at 03/19/17 3474  . bisacodyl (DULCOLAX) suppository 10 mg  10 mg Rectal Daily PRN Radene Gunning, NP      .  docusate sodium (COLACE) capsule 100 mg  100 mg Oral BID Lanae Crumbly, PA-C   100 mg at 03/19/17 1047  . dorzolamide-timolol (COSOPT) 22.3-6.8 MG/ML ophthalmic solution 1 drop  1 drop Both Eyes BID Radene Gunning, NP   1 drop at 03/19/17 1047  . feeding supplement (GLUCERNA SHAKE) (GLUCERNA SHAKE) liquid 237 mL  237 mL Oral BID BM Mariel Aloe, MD   237 mL at 03/19/17 1047  . ferrous gluconate (FERGON) tablet 324 mg  324 mg Oral BID WC Samuella Cota, MD   324 mg at 03/19/17 0853  . HYDROcodone-acetaminophen (NORCO/VICODIN) 5-325 MG per tablet 1-2 tablet  1-2 tablet Oral Q6H PRN Lanae Crumbly, PA-C   2 tablet at 03/19/17 5916  . insulin aspart  (novoLOG) injection 0-9 Units  0-9 Units Subcutaneous TID WC Samuella Cota, MD   1 Units at 03/18/17 1709  . latanoprost (XALATAN) 0.005 % ophthalmic solution 1 drop  1 drop Both Eyes QHS Radene Gunning, NP   1 drop at 03/18/17 2131  . lisinopril (PRINIVIL,ZESTRIL) tablet 20 mg  20 mg Oral Daily Radene Gunning, NP   20 mg at 03/19/17 1047  . meloxicam (MOBIC) tablet 15 mg  15 mg Oral Daily Radene Gunning, NP   15 mg at 03/19/17 1047  . menthol-cetylpyridinium (CEPACOL) lozenge 3 mg  1 lozenge Oral PRN Lanae Crumbly, PA-C       Or  . phenol (CHLORASEPTIC) mouth spray 1 spray  1 spray Mouth/Throat PRN Lanae Crumbly, PA-C      . methocarbamol (ROBAXIN) tablet 500 mg  500 mg Oral Q6H PRN Radene Gunning, NP   500 mg at 03/16/17 2103   Or  . methocarbamol (ROBAXIN) 500 mg in dextrose 5 % 50 mL IVPB  500 mg Intravenous Q6H PRN Radene Gunning, NP   Stopped at 03/18/17 0021  . morphine 4 MG/ML injection 0.52 mg  0.52 mg Intravenous Q2H PRN Radene Gunning, NP   0.52 mg at 03/17/17 2025  . ondansetron (ZOFRAN) tablet 4 mg  4 mg Oral Q6H PRN Lanae Crumbly, PA-C       Or  . ondansetron [facility]) injection 4 mg  4 mg Intravenous Q6H PRN Lanae Crumbly, PA-C   4 mg at 03/17/17 2025  . oxybutynin (DITROPAN) tablet 5 mg  5 mg Oral BID Radene Gunning, NP   5 mg at 03/19/17 1047  . pantoprazole (PROTONIX) EC tablet 40 mg  40 mg Oral QAC breakfast Radene Gunning, NP   40 mg at 03/19/17 0853  . polyethylene glycol (MIRALAX / GLYCOLAX) packet 17 g  17 g Oral BID Samuella Cota, MD   17 g at 03/19/17 1047  . QUEtiapine (SEROQUEL) tablet 25 mg  25 mg Oral QHS Radene Gunning, NP   25 mg at 03/18/17 2130  . senna (SENOKOT) tablet 8.6 mg  1 tablet Oral QHS Samuella Cota, MD         Discharge Medications: Please see discharge summary for a list of discharge medications.  Relevant Imaging Results:  Relevant Lab Results:   Additional Information SS# [place], [place]

## 2017-03-19 NOTE — Progress Notes (Signed)
Patient ID: [name], female   DOB: 1933/09/24, 83 y.o.   MRN: [id number] Trace drainage . Reviewed c- arm images with daughter. NWB left LE.  Sacral decub tx.  SNF

## 2017-03-19 NOTE — Evaluation (Signed)
Occupational Therapy Evaluation Patient Details Name: [name] MRN: [id number] DOB: May 29, 1933 Today's Date: 03/19/2017    History of Present Illness Pt is an 83 y/o female admitted secondary to L hip fracture from fall. S/p ORIF of L femoral shaft on 11/21. Pt also found to have sacral pressure wound. PMH includes heart murmurs, HTN, DM, bells palsy, depression, and L hemi hip arthroplasty X 2.    Clinical Impression   Pt admitted with the above diagnoses and presents with below problem list. Pt will benefit from continued acute OT to address the below listed deficits and maximize independence with basic ADLs prior to d/c to next venue. PTA pt was needing some assistance with bathing/dressing, +1 assist for functional transfers. Pt unable to fully stand this session. Pt +2 max A bed mobility, total A for LB ADLs.       Follow Up Recommendations  SNF    Equipment Recommendations  Other (comment)(defer to next venue)    Recommendations for Other Services       Precautions / Restrictions Precautions Precautions: Fall Precaution Comments: Multiple falls.  Restrictions Weight Bearing Restrictions: Yes LLE Weight Bearing: Non weight bearing      Mobility Bed Mobility Overal bed mobility: Needs Assistance Bed Mobility: Supine to Sit;Sit to Supine     Supine to sit: Max assist;+2 for physical assistance Sit to supine: Max assist;+2 for physical assistance   General bed mobility comments: Max A +2 for bed mobility. Required assist with trunk elevation and scooting hips to EOB. Pt with posterior lean upon sitting and required cues and assist for upright.   Transfers Overall transfer level: Needs assistance               General transfer comment: attempted to stand but did not complete due to pt not being able to powerup with only RLE and BUE.     Balance Overall balance assessment: Needs assistance Sitting-balance support: Bilateral upper extremity supported;Feet  supported Sitting balance-Leahy Scale: Poor Sitting balance - Comments: Posterior lean in sitting. Required at least min assist to maintain sitting balance.  Postural control: Posterior lean                                 ADL either performed or assessed with clinical judgement   ADL Overall ADL's : Needs assistance/impaired Eating/Feeding: Set up;Bed level   Grooming: Set up;Bed level   Upper Body Bathing: Maximal assistance;Sitting;Bed level   Lower Body Bathing: Total assistance;Sitting/lateral leans;+2 for physical assistance;Maximal assistance   Upper Body Dressing : Maximal assistance;Sitting;Bed level   Lower Body Dressing: Maximal assistance;Total assistance;+2 for physical assistance;Sitting/lateral leans;Bed level                 General ADL Comments: Pt completed bed mobility, sat EOB with assist for about 3 minutes.      Vision         Perception     Praxis      Pertinent Vitals/Pain Pain Assessment: Faces Faces Pain Scale: Hurts whole lot Pain Location: LLE  Pain Descriptors / Indicators: Operative site guarding;Sharp;Grimacing;Moaning Pain Intervention(s): Limited activity within patient's tolerance;Monitored during session;Repositioned     Hand Dominance     Extremity/Trunk Assessment Upper Extremity Assessment Upper Extremity Assessment: Generalized weakness   Lower Extremity Assessment Lower Extremity Assessment: Defer to PT evaluation   Cervical / Trunk Assessment Cervical / Trunk Assessment: Kyphotic   Communication  Cognition Arousal/Alertness: Awake/alert Behavior During Therapy: WFL for tasks assessed/performed Overall Cognitive Status: Within Functional Limits for tasks assessed                                     General Comments       Exercises     Shoulder Instructions      Home Living Family/patient expects to be discharged to:: Skilled nursing facility Living Arrangements:  Spouse/significant other                                      Prior Functioning/Environment Level of Independence: Needs assistance  Gait / Transfers Assistance Needed: Family reports she only went to potty chair and back to lift chair.  ADL's / Homemaking Assistance Needed: assistance with bathing/dressing at baseline.             OT Problem List: Decreased strength;Decreased activity tolerance;Impaired balance (sitting and/or standing);Decreased knowledge of use of DME or AE;Decreased knowledge of precautions;Pain;Impaired UE functional use      OT Treatment/Interventions: Self-care/ADL training;Energy conservation;DME and/or AE instruction;Therapeutic activities;Patient/family education;Balance training    OT Goals(Current goals can be found in the care plan section) Acute Rehab OT Goals Patient Stated Goal: to get better  OT Goal Formulation: With patient/family Time For Goal Achievement: 03/26/17 Potential to Achieve Goals: Good ADL Goals Pt Will Perform Grooming: with min guard assist;sitting Pt Will Transfer to Toilet: with max assist;with +2 assist;squat pivot transfer;stand pivot transfer Pt Will Perform Toileting - Clothing Manipulation and hygiene: with max assist;sitting/lateral leans Additional ADL Goal #1: Pt will complete bed mobility with min +2 assist to prepare for OOB/EOB ADLs. Additional ADL Goal #2: Pt will sit EOB for 5 minutes to engage in ADL task with min guard assist for sitting balance.  OT Frequency: Min 2X/week   Barriers to D/C:            Co-evaluation              AM-PAC PT "6 Clicks" Daily Activity     Outcome Measure Help from another person eating meals?: A Little Help from another person taking care of personal grooming?: A Lot Help from another person toileting, which includes using toliet, bedpan, or urinal?: Total Help from another person bathing (including washing, rinsing, drying)?: Total Help from another  person to put on and taking off regular upper body clothing?: A Lot Help from another person to put on and taking off regular lower body clothing?: Total 6 Click Score: 10   End of Session Equipment Utilized During Treatment: Oxygen Nurse Communication: Other (comment)(NT assisted during session. )  Activity Tolerance: Patient limited by fatigue;Patient limited by pain Patient left: in bed;with call bell/phone within reach;with SCD's reapplied;with family/visitor present  OT Visit Diagnosis: Muscle weakness (generalized) (M62.81);Pain Pain - Right/Left: Left Pain - part of body: Leg                Time: 1140-1200 OT Time Calculation (min): 20 min Charges:  OT General Charges $OT Visit: 1 Visit OT Evaluation $OT Eval Low Complexity: 1 Low G-Codes:       Hortencia Pilar 03/19/2017, 12:19 PM

## 2017-03-19 NOTE — Consult Note (Signed)
[facility] Nurse wound consult note Reason for Consult: sacral pressure injury Wound type:Stage 3 Pressure injury  Pressure Injury POA: Yes Measurement:2.5cm x 1.0cm x 1.0cm with undermining distally 0.5cm  Wound DQQ:IWLN, pink, moist Drainage (amount, consistency, odor) minimal, no odor Periwound: chronic non healing appearance with sever epibole of the wound edges  Dressing procedure/placement/frequency: Pack with silver hydrofiber, cover with foam dressing. Change packing daily, change foam every 3 days and PRN soilage.   Discussed POC with patient and bedside nurse.  Re consult if needed, will not follow at this time. Thanks  Katalin Colledge [employer], RN,CWOCN, CNS, [place] [phone])

## 2017-03-20 DIAGNOSIS — S73005A Unspecified dislocation of left hip, initial encounter: Secondary | ICD-10-CM

## 2017-03-20 LAB — CBC
HEMATOCRIT: 22 % — AB (ref 36.0–46.0)
HEMOGLOBIN: 7.3 g/dL — AB (ref 12.0–15.0)
MCH: 28.3 pg (ref 26.0–34.0)
MCHC: 33.2 g/dL (ref 30.0–36.0)
MCV: 85.3 fL (ref 78.0–100.0)
Platelets: 258 10*3/uL (ref 150–400)
RBC: 2.58 MIL/uL — AB (ref 3.87–5.11)
RDW: 14.6 % (ref 11.5–15.5)
WBC: 8.2 10*3/uL (ref 4.0–10.5)

## 2017-03-20 LAB — PREPARE RBC (CROSSMATCH)

## 2017-03-20 LAB — GLUCOSE, CAPILLARY
GLUCOSE-CAPILLARY: 112 mg/dL — AB (ref 65–99)
Glucose-Capillary: 88 mg/dL (ref 65–99)
Glucose-Capillary: 84 mg/dL (ref 65–99)
Glucose-Capillary: 122 mg/dL — ABNORMAL HIGH (ref 65–99)

## 2017-03-20 MED ORDER — SODIUM CHLORIDE 0.9 % IV SOLN
Freq: Once | INTRAVENOUS | Status: AC
  Administered 2017-03-20: 1 mL via INTRAVENOUS

## 2017-03-20 MED ORDER — SODIUM CHLORIDE 0.9 % IV SOLN
Freq: Once | INTRAVENOUS | Status: AC
  Administered 2017-03-20: 14:00:00 via INTRAVENOUS

## 2017-03-20 NOTE — Progress Notes (Signed)
Ms [name] [phone] from SNF asking about her discharge I told her pt will need to get blood transfusion today.

## 2017-03-20 NOTE — Progress Notes (Signed)
Pt hgb 7.3 ordered one unit of PRBC given verified by Barrett Henle, RN completed no s/s of blood transfusion reaction noted.

## 2017-03-20 NOTE — Progress Notes (Signed)
PROGRESS NOTE  [name] KKX:[id number] DOB: [birth year] DOA: 03/15/2017 PCP: Medicine, [facility]  Brief Narrative: 75yow transferred from [facility] for treatment of left hip fracture s/p mechanical fall at home.  Assessment/Plan Dislocated left hip prosthesis with fracture of proximal femoral shaft. S/p surgery 11/21.  - no change today: NWB left leg. ASA 325 mg daily for DVT prophylaxis per Dr Lorin Mercy.  ABLA secondary to surgery, superimposed on anemia of chronic disease with baseline Hgb ~9 - Hgb stable but borderline and patient symptomatic. Discussed risk/benefit with patient, rec 1 unit PRBC, she consents.  Hyponatremia, chronic to at least 2014, on Elavil, Celexa, Seroquel. - Asymptomatic. No further evaluation or treatment recommended  DM type 2 - CBG remains stable 11/24, continue SSI 11/24, resume metformin on discharge  Essential hypertension - stable 11/24, continue lisinopril  Thoracic aortic atherosclerosis  - no treatment recommended at this time  Sacral decubitis, present on admission - wound care RN consult appreciated  Will transfuse 1 unit PRBC. Anticipate transfer to [facility] 11/25.  DVT prophylaxis: per orthopedics, ASA, SCDs Code Status: full Family Communication: at bedside Disposition Plan: SNF   [name], MD  [facility] Direct contact: [phone] --[place] [place]  --[email or web address]; password [email or web address]  7PM-7AM contact night coverage as above 03/20/2017, 2:45 PM  LOS: 5 days   Consultants:  Orthopedics   Procedures:    Antimicrobials:    Interval history/Subjective: Feels ok except for pain on bottom. Lightheaded when sitting up.  Objective: Vitals:  Vitals:   03/20/17 0506 03/20/17 1400  BP: (!) 108/42 (!) 127/47  Pulse: 82 94  Resp: 16 16  Temp: 97.9 F (36.6 C) 97.9 F (36.6 C)  SpO2: 92% 98%    Exam:   Constitutional:   . Appears calm and comfortable Respiratory:  . CTA bilaterally, no w/r/r.   . Respiratory effort normal. Cardiovascular:  . RRR, no m/r/g Musculoskeletal:  . RLE, LLE. Moves both to command Psychiatric:  . Mental status o Mood, affect appropriate . Judgement and insight appear normal      I have personally reviewed the following:   Labs:  CBG stable 11/24  Na 125 >> 129 >> 131 >> 127 >> 126  Hgb stable 9.0 >> 9.1 >> 7.4 >> 7.3  Scheduled Meds: . amitriptyline  25 mg Oral Daily  . aspirin EC  325 mg Oral Q breakfast  . docusate sodium  100 mg Oral BID  . dorzolamide-timolol  1 drop Both Eyes BID  . feeding supplement (GLUCERNA SHAKE)  237 mL Oral BID BM  . ferrous gluconate  324 mg Oral BID WC  . insulin aspart  0-9 Units Subcutaneous TID WC  . latanoprost  1 drop Both Eyes QHS  . lisinopril  20 mg Oral Daily  . meloxicam  15 mg Oral Daily  . oxybutynin  5 mg Oral BID  . pantoprazole  40 mg Oral QAC breakfast  . polyethylene glycol  17 g Oral BID  . QUEtiapine  25 mg Oral QHS  . senna  1 tablet Oral QHS   Continuous Infusions: . sodium chloride 85 mL/hr at 03/19/17 0438  . methocarbamol (ROBAXIN)  IV Stopped (03/18/17 0021)    Principal Problem:   Femur fracture, left (HCC) Active Problems:   Hip fracture (HCC)   Hypertension   Hyponatremia   Anemia, chronic disease   Acute blood loss anemia   Thoracic aortic atherosclerosis (HCC)   Pressure injury of skin   Dislocated hip, left, initial  encounter ([place])   LOS: 5 days

## 2017-03-21 ENCOUNTER — Encounter (HOSPITAL_COMMUNITY): Payer: Self-pay | Admitting: Family Medicine

## 2017-03-21 DIAGNOSIS — S73006A Unspecified dislocation of unspecified hip, initial encounter: Secondary | ICD-10-CM

## 2017-03-21 DIAGNOSIS — L89153 Pressure ulcer of sacral region, stage 3: Secondary | ICD-10-CM

## 2017-03-21 DIAGNOSIS — L8990 Pressure ulcer of unspecified site, unspecified stage: Secondary | ICD-10-CM

## 2017-03-21 DIAGNOSIS — S7290XA Unspecified fracture of unspecified femur, initial encounter for closed fracture: Secondary | ICD-10-CM

## 2017-03-21 HISTORY — DX: Pressure ulcer of sacral region, stage 3: L89.153

## 2017-03-21 LAB — TYPE AND SCREEN
ABO/RH(D): B NEG
Antibody Screen: NEGATIVE
UNIT DIVISION: 0

## 2017-03-21 LAB — BPAM RBC
BLOOD PRODUCT EXPIRATION DATE: 201812072359
ISSUE DATE / TIME: 201811241350
Unit Type and Rh: 1700

## 2017-03-21 LAB — CBC
HEMATOCRIT: 26.5 % — AB (ref 36.0–46.0)
HEMOGLOBIN: 9.1 g/dL — AB (ref 12.0–15.0)
MCH: 28.8 pg (ref 26.0–34.0)
MCHC: 34.3 g/dL (ref 30.0–36.0)
MCV: 83.9 fL (ref 78.0–100.0)
Platelets: 319 10*3/uL (ref 150–400)
RBC: 3.16 MIL/uL — ABNORMAL LOW (ref 3.87–5.11)
RDW: 14.4 % (ref 11.5–15.5)
WBC: 8 10*3/uL (ref 4.0–10.5)

## 2017-03-21 LAB — GLUCOSE, CAPILLARY
GLUCOSE-CAPILLARY: 116 mg/dL — AB (ref 65–99)
GLUCOSE-CAPILLARY: 92 mg/dL (ref 65–99)

## 2017-03-21 MED ORDER — HYDROCODONE-ACETAMINOPHEN 5-325 MG PO TABS: 1.0000 | ORAL_TABLET | Freq: Four times a day (QID) | ORAL | 0 refills | Status: DC | PRN

## 2017-03-21 MED ORDER — GLUCERNA SHAKE PO LIQD: 237.0000 mL | Freq: Two times a day (BID) | ORAL | 0 refills | Status: DC

## 2017-03-21 MED ORDER — ACETAMINOPHEN 325 MG PO TABS: 650.0000 mg | ORAL_TABLET | Freq: Four times a day (QID) | ORAL | Status: DC | PRN

## 2017-03-21 NOTE — Progress Notes (Signed)
Report called to [facility] in [place]. S/W [place]. Susie Cassette RN

## 2017-03-21 NOTE — Progress Notes (Addendum)
CSW met with pt and confirmed pt's plan to be discharged to [facility] to for rehab at discharge.  CSW provided active listening and validated pt's concerns.   CSW will send out D/C summary and D/C order out to SNF facility via the hub per pt's request.  Pt has been living independently prior to being admitted to [facility].  Packet for [facility] given to RN.  [facility] called.  Please reconsult if future social work needs arise.  CSW signing off, as social work intervention is no longer needed.  Alphonse Guild. Allisha Harter, LCSW, LCAS, CSI Clinical Social Worker Ph: [phone]

## 2017-03-21 NOTE — Clinical Social Work Placement (Signed)
   CLINICAL SOCIAL WORK PLACEMENT  NOTE  Date:  03/21/2017  Patient Details  Name: [name] MRN: [id number] Date of Birth: 1933-12-14  Clinical Social Work is seeking post-discharge placement for this patient at the [place] level of care (*CSW will initial, date and re-position this form in  chart as items are completed):  Yes   Patient/family provided with [place] Work Department's list of facilities offering this level of care within the geographic area requested by the patient (or if unable, by the patient's family).  Yes   Patient/family informed of their freedom to choose among providers that offer the needed level of care, that participate in Medicare, Medicaid or managed care program needed by the patient, have an available bed and are willing to accept the patient.  Yes   Patient/family informed of [facility]'s ownership interest in [facility] and [facility], as well as of the fact that they are under no obligation to receive care at these facilities.  PASRR submitted to EDS on       PASRR number received on       Existing PASRR number confirmed on       FL2 transmitted to all facilities in geographic area requested by pt/family on 03/19/17     FL2 transmitted to all facilities within larger geographic area on       Patient informed that his/her managed care company has contracts with or will negotiate with certain facilities, including the following:            Patient/family informed of bed offers received.  Patient chooses bed at   [facility] recommends and patient chooses bed at     [facility] Patient to be transferred to   on  .  Patient to be transferred to facility by     [facility]  Patient family notified on   of transfer.  Name of family member notified:        PHYSICIAN       Additional Comment:    _______________________________________________ Claudine Mouton, [place] 03/21/2017, 4:25  PM

## 2017-03-21 NOTE — Discharge Summary (Addendum)
Physician Discharge Summary  Morrisa Aldaba MPN:[id number] DOB: 1933/05/28 DOA: 03/15/2017  PCP: [facility]  Admit date: 03/15/2017 Discharge date: 03/21/2017  Recommendations for Outpatient Follow-up:  1. Dislocated left hip prosthesis with fracture of proximal femoral shaft. S/p surgery 11/21. NWB left leg. ASA 325 mg daily for DVT prophylaxis. Contact Dr. Lorin Mercy office for post-op instructions and follow-up arrangements. 2. Hyponatremia, chronic to at least 2014 3. Sacral decubitis, present on admission.  Pack with silver hydrofiber, cover with foam dressing. Change packing daily, change foam every 3 days and PRN soilage.       Contact information for follow-up providers    Marybelle Killings, MD. Schedule an appointment as soon as possible for a visit in 2 week(s).   Specialty:  Orthopedic Surgery Why:  call office to make appointment Contact information: [place] [id number] [phone]            Contact information for after-discharge care    [facility] Follow up.   Service:  Skilled Nursing Contact information: [address] [place] [phone]                   Discharge Diagnoses:  1. Dislocated left hip prosthesis with fracture of proximal femoral shaft 2. ABLA  3. Hyponatremia, chronic 4. DM type 2 5. Essential hypertension 6. Thoracic aortic atherosclerosis  7. Sacral decubitis, present on admission  Discharge Condition: improved Disposition: SNF  Diet recommendation: diabetic diet  Filed Weights   03/15/17 1354 03/17/17 1218  Weight: 53.1 kg (117 lb 1 oz) 53.1 kg (117 lb)    History of present illness:  83yow transferred from [facility] for treatment of left hip dislocation and left femur fracture s/p mechanical fall at home.  Hospital Course:  Patient under operative management per orthopedics without complication except for ABLA  requiring PRBC transfusion and resulting in delay in hospitalization. Individual issues as below.  Dislocated left hip prosthesis with fracture of proximal femoral shaft. S/p surgery 11/21.  - stable, cleared by orthopedics for discharge - NWB left leg. ASA 325 mg daily for DVT prophylaxis per Dr Lorin Mercy.  ABLA secondary to surgery, superimposed on anemia of chronic disease with baseline Hgb ~9 - Hgb improved s/p transfusion  Hyponatremia, chronic to at least 2014, on Elavil, Celexa, Seroquel. - Asymptomatic. No further evaluation or treatment recommended  DM type 2 - CBG remains stable 11/25, treated with SSI, resume metformin on discharge  Essential hypertension - stable 11/25, continue lisinopril  Thoracic aortic atherosclerosis  - no treatment recommended at this time  Sacral decubitis, present on admission - wound care RN consult appreciated  Consultants:  Orthopedics   Procedures:  Procedure right hip Girdlestone procedure.  Removal of old subsided hemiarthroplasty cables.  Adduction internal fixation of trochanteric fracture femoral shaft fracture the long plate multiple screws and cables   Transfusion total 3 units PRBC   Today's assessment: S: feels better today, brief anxiety episode earlier today. Pain mostly on bottom rather than hip. O: Vitals:  Vitals:   03/20/17 2104 03/21/17 0506  BP: (!) 143/58 140/60  Pulse: 87 86  Resp: 18 14  Temp: 98.3 F (36.8 C) 98.3 F (36.8 C)  SpO2: 96% 97%    Constitutional:  . Appears calm and comfortable Respiratory:  . CTA bilaterally, no w/r/r.  . Respiratory effort normal.  Cardiovascular:  . RRR, no m/r/g . No LE extremity edema   Musculoskeletal:  .  Moves BLE to command Psychiatric:  . Mental status o Mood, affect appropriate  Discharge Instructions   Allergies as of 03/21/2017   No Known Allergies     Medication List    TAKE these medications   acetaminophen 325 MG tablet Commonly  known as:  TYLENOL Take 2 tablets (650 mg total) by mouth every 6 (six) hours as needed for mild pain.   amitriptyline 25 MG tablet Commonly known as:  ELAVIL Take 25-50 mg daily by mouth.   aspirin 325 MG EC tablet Take 1 tablet (325 mg total) by mouth daily with breakfast.   CALMOSEPTINE 0.44-20.6 % Oint Generic drug:  Menthol-Zinc Oxide Apply 1 application 2 (two) times daily as needed topically for rash.   dorzolamide-timolol 22.3-6.8 MG/ML ophthalmic solution Commonly known as:  COSOPT Place 1 drop 2 (two) times daily into both eyes.   feeding supplement (GLUCERNA SHAKE) Liqd Take 237 mLs by mouth 2 (two) times daily between meals.   ferrous sulfate 325 (65 FE) MG tablet Take 325 mg 2 (two) times daily by mouth.   HYDROcodone-acetaminophen 5-325 MG tablet Commonly known as:  NORCO/VICODIN Take 1-2 tablets by mouth every 6 (six) hours as needed for moderate pain.   hydrOXYzine 25 MG tablet Commonly known as:  ATARAX/VISTARIL Take 25 mg at bedtime as needed by mouth for sleep.   KLOR-CON M20 20 MEQ tablet Generic drug:  potassium chloride SA Take 20 mEq daily by mouth. with food   latanoprost 0.005 % ophthalmic solution Commonly known as:  XALATAN Place 1 drop at bedtime into both eyes.   lisinopril 20 MG tablet Commonly known as:  PRINIVIL,ZESTRIL Take 20 mg by mouth daily.   meloxicam 15 MG tablet Commonly known as:  MOBIC Take 15 mg daily by mouth.   oxybutynin 5 MG tablet Commonly known as:  DITROPAN Take 5 mg 2 (two) times daily by mouth.   pantoprazole 40 MG tablet Commonly known as:  PROTONIX Take 40 mg daily before breakfast by mouth.   QUEtiapine 25 MG tablet Commonly known as:  SEROQUEL Take 25-50 mg at bedtime by mouth.   senna-docusate 8.6-50 MG tablet Commonly known as:  Senokot-S Take 1 tablet by mouth at bedtime as needed.      No Known Allergies  The results of significant diagnostics from this hospitalization (including imaging,  microbiology, ancillary and laboratory) are listed below for reference.    Significant Diagnostic Studies: Dg Pelvis 1-2 Views  Result Date: 03/15/2017 CLINICAL DATA:  Fall, multiple left hip surgeries EXAM: PELVIS - 1-2 VIEW COMPARISON:  Pelvic radiographs dated 05/20/2012 FINDINGS: Left hip hemiarthroplasty with cerclage wire revision. Prosthetic femoral head is dislocated relative to the acetabulum. Overlying heterotopic calcification. Left pelvic ring fractures are difficult to exclude. Degenerative changes of the lower lumbar spine. IMPRESSION: Left hip hemiarthroplasty with prosthetic dislocation. Left pelvic ring fractures are difficult to exclude. Consider dedicated left hip radiographs, possibly after relocation. Electronically Signed   By: Julian Hy M.D.   On: 03/15/2017 15:14   Chest Portable 1 View  Result Date: 03/15/2017 CLINICAL DATA:  [place] from a standing position. History of diabetes, asthma, hypertension, former smoker. EXAM: PORTABLE CHEST 1 VIEW COMPARISON:  Chest x-ray of June 07, 2012 FINDINGS: The lungs are mildly hyperinflated and clear. The heart and pulmonary vascularity are normal. The mediastinum is normal in width. There is calcification in the wall of the aortic arch. The observed bony thorax is unremarkable. IMPRESSION: Mild chronic bronchitic-smoking related changes. There is  no acute cardiopulmonary disease. Thoracic aortic atherosclerosis. Electronically Signed   By: David  Martinique M.D.   On: 03/15/2017 15:09   Dg C-arm 61-120 Min  Result Date: 03/17/2017 CLINICAL DATA:  ORIF of left femoral fracture with removal of arthroplasty. EXAM: DG C-ARM 61-120 MIN; LEFT FEMUR 2 VIEWS COMPARISON:  03/15/2017 preoperative radiographs. FINDINGS: Six C-arm intraoperative views of the left femur status post removal of indwelling uncemented left hip arthroplasty and placement of new lateral plate and screw fixation across an oblique fracture of the femoral diaphysis is  demonstrated. New cerclage wires are also seen about the diaphysis of the femur. Fine bony detail is limited by the C-arm fluoroscopic technique. Shallow appearing left acetabulum. IMPRESSION: Cerclage, plate and screw fixation across a left femoral diaphyseal fracture and interval removal of pre-existing uncemented left hip arthroplasty. No immediate complications are apparent. Electronically Signed   By: Ashley Royalty M.D.   On: 03/17/2017 16:40   Dg Femur 1v Left  Result Date: 03/15/2017 CLINICAL DATA:  Femoral fracture. EXAM: LEFT FEMUR 1 VIEW COMPARISON:  Radiograph of same day. FINDINGS: Left hip arthroplasty is again noted. The fracture noted around the distal portion of the femoral prosthesis does not appear to be displaced anteriorly or posteriorly. IMPRESSION: No anterior or posterior displacement is seen involving the fracture around the distal portion of femoral prosthesis. Electronically Signed   By: Marijo Conception, M.D.   On: 03/15/2017 15:45   Dg Femur 1v Left  Result Date: 03/15/2017 CLINICAL DATA:  Fall EXAM: LEFT FEMUR 1 VIEW COMPARISON:  Pelvic radiograph 03/15/2017 FINDINGS: There is a left total hip arthroplasty that is dislocated from the pelvis, more completely characterized on the dedicated pelvic radiographs. There is an oblique, mildly laterally displaced fracture of the proximal shaft of the femur, approximately 8 cm from the distal tip of the femoral prosthesis. IMPRESSION: Left total hip arthroplasty femoral component periprosthetic oblique fracture with mild lateral displacement. Electronically Signed   By: Ulyses Jarred M.D.   On: 03/15/2017 15:35   Dg Femur Min 2 Views Left  Result Date: 03/17/2017 CLINICAL DATA:  ORIF of left femoral fracture with removal of arthroplasty. EXAM: DG C-ARM 61-120 MIN; LEFT FEMUR 2 VIEWS COMPARISON:  03/15/2017 preoperative radiographs. FINDINGS: Six C-arm intraoperative views of the left femur status post removal of indwelling uncemented  left hip arthroplasty and placement of new lateral plate and screw fixation across an oblique fracture of the femoral diaphysis is demonstrated. New cerclage wires are also seen about the diaphysis of the femur. Fine bony detail is limited by the C-arm fluoroscopic technique. Shallow appearing left acetabulum. IMPRESSION: Cerclage, plate and screw fixation across a left femoral diaphyseal fracture and interval removal of pre-existing uncemented left hip arthroplasty. No immediate complications are apparent. Electronically Signed   By: Ashley Royalty M.D.   On: 03/17/2017 16:40    Microbiology: Recent Results (from the past 240 hour(s))  Surgical PCR screen     Status: None   Collection Time: 03/16/17  3:57 PM  Result Value Ref Range Status   MRSA, PCR NEGATIVE NEGATIVE Final   Staphylococcus aureus NEGATIVE NEGATIVE Final    Comment: (NOTE) The Xpert SA Assay (FDA approved for NASAL specimens in patients 78 years of age and older), is one component of a comprehensive surveillance program. It is not intended to diagnose infection nor to guide or monitor treatment.      Labs: Basic Metabolic Panel: Recent Labs  Lab 03/16/17 0541 03/16/17 1529 03/17/17 0800  03/17/17 1543 03/18/17 0223 03/19/17 0558  NA 127* 125* 129* 131* 127* 126*  K 4.0 3.8 3.8 4.1 4.1 3.9  CL 98* 95* 96*  --  95* 97*  CO2 25 24 26   --  26 24  GLUCOSE 103* 120* 90 96 118* 92  BUN 15 14 10   --  13 11  CREATININE 0.68 0.78 0.57  --  0.59 0.57  CALCIUM 8.2* 8.4* 8.5*  --  7.5* 7.7*   Liver Function Tests: Recent Labs  Lab 03/15/17 1447 03/17/17 0800  AST 22 23  ALT 14 14  ALKPHOS 79 68  BILITOT 0.7 0.8  PROT 7.9 6.5  ALBUMIN 2.9* 2.3*   CBC: Recent Labs  Lab 03/17/17 0732 03/17/17 1543 03/18/17 0223 03/19/17 0558 03/20/17 0546 03/21/17 0621  WBC 5.7  --  6.5 9.0 8.2 8.0  HGB 9.0* 16.0* 9.1* 7.4* 7.3* 9.1*  HCT 27.7* 47.0* 26.9* 22.1* 22.0* 26.5*  MCV 82.9  --  83.8 84.7 85.3 83.9  PLT 281  --   211 215 258 319    CBG: Recent Labs  Lab 03/20/17 0832 03/20/17 1202 03/20/17 1706 03/20/17 2155 03/21/17 0802  GLUCAP 88 84 112* 122* 116*    Principal Problem:   Femur fracture, left (HCC) Active Problems:   Hip fracture (HCC)   Hypertension   Hyponatremia   Anemia, chronic disease   Acute blood loss anemia   Thoracic aortic atherosclerosis (HCC)   Pressure injury of skin   Dislocated hip, left, initial encounter ([place])   Pressure injury of sacral region, stage 3 ([place])   Time coordinating discharge: 35 minutes  Signed:  [name], MD [facility] 03/21/2017, 10:53 AM

## 2017-03-22 ENCOUNTER — Encounter (INDEPENDENT_AMBULATORY_CARE_PROVIDER_SITE_OTHER): Payer: Self-pay

## 2017-03-22 ENCOUNTER — Encounter (HOSPITAL_COMMUNITY): Payer: Self-pay | Admitting: Orthopaedic Surgery

## 2017-03-22 ENCOUNTER — Telehealth (INDEPENDENT_AMBULATORY_CARE_PROVIDER_SITE_OTHER): Payer: Self-pay | Admitting: Orthopaedic Surgery

## 2017-03-22 NOTE — Telephone Encounter (Signed)
Please see note.

## 2017-03-22 NOTE — Telephone Encounter (Signed)
Percell Locus PT with [facility] called for patients weight bearing status. She stated they did not receive much information of patient when she was admitted. CB # for [employer]   [phone]

## 2017-03-22 NOTE — Telephone Encounter (Signed)
I called and left her message NWB times  2 months. FYI

## 2017-03-22 NOTE — Anesthesia Postprocedure Evaluation (Signed)
Anesthesia Post Note  Patient: [name]  Procedure(s) Performed: REMOVAL OF HEMIARTHROPLASTY, GIRDLESTONE, OPEN REDUCTION INTERNAL FIXATION OF FEMUR FRACTURE, PLATING OF FEMUR FRACTURE (Left )     Patient location during evaluation: PACU Anesthesia Type: General Level of consciousness: awake and alert Pain management: pain level controlled Vital Signs Assessment: post-procedure vital signs reviewed and stable Respiratory status: spontaneous breathing, nonlabored ventilation, respiratory function stable and patient connected to nasal cannula oxygen Cardiovascular status: blood pressure returned to baseline and stable Postop Assessment: no apparent nausea or vomiting Anesthetic complications: no    Last Vitals:  Vitals:   03/20/17 2104 03/21/17 0506  BP: (!) 143/58 140/60  Pulse: 87 86  Resp: 18 14  Temp: 36.8 C 36.8 C  SpO2: 96% 97%    Last Pain:  Vitals:   03/21/17 0106  TempSrc:   PainSc: Tyler Deis

## 2017-03-22 NOTE — Telephone Encounter (Signed)
[name] at [facility] called to let Dr. Lorin Mercy know that patients prosthesis popped/came out and they are readmitting her to hospital. She is going to wait to schedule a follow up from her surgery on 11/21 once they decide what to do. Her cell # [phone]

## 2017-03-23 ENCOUNTER — Encounter (INDEPENDENT_AMBULATORY_CARE_PROVIDER_SITE_OTHER): Payer: Self-pay | Admitting: Radiology

## 2017-04-06 ENCOUNTER — Telehealth (INDEPENDENT_AMBULATORY_CARE_PROVIDER_SITE_OTHER): Payer: Self-pay | Admitting: Orthopaedic Surgery

## 2017-04-06 NOTE — Telephone Encounter (Signed)
Patients daughter called on behalf of patient requesting an order for a new wheel chair that has a moveable arm so patient can slide over to a toilet. Janice's CB # [phone]

## 2017-04-07 NOTE — Telephone Encounter (Signed)
Ok for script

## 2017-04-07 NOTE — Telephone Encounter (Signed)
Yes. Ok    thanks

## 2017-04-08 ENCOUNTER — Ambulatory Visit (INDEPENDENT_AMBULATORY_CARE_PROVIDER_SITE_OTHER): Payer: No Typology Code available for payment source

## 2017-04-08 ENCOUNTER — Ambulatory Visit (INDEPENDENT_AMBULATORY_CARE_PROVIDER_SITE_OTHER): Payer: No Typology Code available for payment source | Admitting: Orthopaedic Surgery

## 2017-04-08 ENCOUNTER — Encounter (INDEPENDENT_AMBULATORY_CARE_PROVIDER_SITE_OTHER): Payer: Self-pay | Admitting: Orthopaedic Surgery

## 2017-04-08 VITALS — BP 138/72 | HR 73

## 2017-04-08 DIAGNOSIS — S7292XD Unspecified fracture of left femur, subsequent encounter for closed fracture with routine healing: Secondary | ICD-10-CM

## 2017-04-08 DIAGNOSIS — Z9889 Other specified postprocedural states: Secondary | ICD-10-CM

## 2017-04-08 NOTE — Telephone Encounter (Signed)
Pt came in to office. Rx given for wheelchair given at appt

## 2017-04-08 NOTE — Progress Notes (Signed)
Post-Op Visit Note   Patient: [name]           Date of Birth: 22-Jul-1933           MRN: [id number] Visit Date: 04/08/2017 PCP: [facility]   Assessment & Plan:  Patient will continue to be nonweightbearing until January 15.  At that time she can begin weightbearing as tolerated with home health PT using a walker to get up.  Otherwise she will just work on transfers in and out of the wheelchair with a wheelchair with removable side arm.  Hospital bed prescription given to her daughter as well as 3 in 1 bedside commode. Chief Complaint:  Chief Complaint  Patient presents with  . Left Leg - Routine Post Op   Visit Diagnoses:  1. Closed fracture of left femur with routine healing, unspecified fracture morphology, unspecified portion of femur, subsequent encounter   2. S/P Girdlestone procedure   Post Girdlestone arthroplasty with femur fracture plating with screws and cables.  Plan: Proximal screws of the greater trochanter is pulled loose with plate separated from proximal portion of the femur.  Prescription given for hospital bed at home.  Wheelchair with removable side arms for transfer and 3 and 1 commode.  Incision looks good and staples were removed today.  Follow-Up Instructions: Return in about 2 months (around 06/09/2017).   Orders:  Orders Placed This Encounter  Procedures  . XR FEMUR MIN 2 VIEWS LEFT   No orders of the defined types were placed in this encounter.   Imaging: Xr Femur Min 2 Views Left  Result Date: 04/08/2017 AP lateral left femur x-rays obtained and reviewed.  This shows long trochanteric attachment attached to long lateral femoral plate with screws and cables.  Proximal trochanteric portion is pulled loose with some loose screws.  Mid shaft fracture alignment looks good. Impression status post ORIF greater trochanter and femoral shaft fracture.  Screws and proximal portion of the plate has separated from the greater trochanter.   Midshaft femur remains in good position.   PMFS History: Patient Active Problem List   Diagnosis Date Noted  . Pressure injury of sacral region, stage 3 ([place]) 03/21/2017  . Dislocated hip, left, initial encounter ([place]) 03/19/2017  . Thoracic aortic atherosclerosis ([place]) 03/17/2017  . Pressure injury of skin 03/17/2017  . Femur fracture, left ([place]) 06/09/2012  . Acute blood loss anemia 05/24/2012  . Hyponatremia 05/20/2012  . Anemia, chronic disease 05/20/2012  . Left bundle branch block 05/20/2012  . Hip fracture ([place]) 05/19/2012  . Hypertension 05/19/2012   Past Medical History:  Diagnosis Date  . Anemia    takes Ferrous Gluconate daily  . Anxiety   . Arthritis    "hands, feet, bad arthritis all over" (03/15/2017)  . Bell's palsy   . Depression   . Fall 03/15/2017   resulting in left hip dislocation and periprostatic femur fracture./notes 03/15/2017  . Falls frequently    "couple times here recently" (03/15/2017)  . GERD (gastroesophageal reflux disease)   . Heart murmur   . Hypertension    takes Lisinopril and Amlodipine daily  . Left bundle branch block   . Periprosthetic fracture of femur following total replacement of hip    Left  . Pressure injury of sacral region, stage 3 ([place]) 03/21/2017  . Thoracic aortic atherosclerosis ([place]) 03/17/2017  . Type II diabetes mellitus ([place])     History reviewed. No pertinent family history.  Past Surgical History:  Procedure Laterality Date  .  APPENDECTOMY    . FRACTURE SURGERY    . HIP ARTHROPLASTY  05/20/2012   Procedure: ARTHROPLASTY BIPOLAR HIP;  Surgeon: Marybelle Killings, MD;  Location: [facility];  Service: Orthopedics;  Laterality: Left;  Left monopolar hemiarthroplasty  . ORIF FEMUR FRACTURE Left 03/17/2017   Procedure: REMOVAL OF HEMIARTHROPLASTY, GIRDLESTONE, OPEN REDUCTION INTERNAL FIXATION OF FEMUR FRACTURE, PLATING OF FEMUR FRACTURE;  Surgeon: Marybelle Killings, MD;  Location: [facility];  Service: Orthopedics;  Laterality: Left;    . TOTAL HIP REVISION Left 06/08/2012   Procedure: TOTAL HIP REVISION;  Surgeon: Marybelle Killings, MD;  Location: [facility];  Service: Orthopedics;  Laterality: Left;  Revision left hip hemiarthroplasty for periprosthetic fracture  . TUBAL LIGATION     Social History   Occupational History  . Not on file  Tobacco Use  . Smoking status: Former Smoker    Packs/day: 3.00    Years: 19.00    Pack years: 57.00    Types: Cigarettes    Last attempt to quit: 05/20/1975    Years since quitting: 41.9  . Smokeless tobacco: Never Used  Substance and Sexual Activity  . Alcohol use: No  . Drug use: No  . Sexual activity: No

## 2017-06-15 ENCOUNTER — Encounter (INDEPENDENT_AMBULATORY_CARE_PROVIDER_SITE_OTHER): Payer: Self-pay | Admitting: Orthopaedic Surgery

## 2017-06-15 ENCOUNTER — Ambulatory Visit (INDEPENDENT_AMBULATORY_CARE_PROVIDER_SITE_OTHER): Payer: Medicare Other | Admitting: Orthopaedic Surgery

## 2017-06-15 ENCOUNTER — Ambulatory Visit (INDEPENDENT_AMBULATORY_CARE_PROVIDER_SITE_OTHER): Payer: Medicare Other

## 2017-06-15 VITALS — Ht 64.0 in | Wt 117.0 lb

## 2017-06-15 DIAGNOSIS — S7292XD Unspecified fracture of left femur, subsequent encounter for closed fracture with routine healing: Secondary | ICD-10-CM

## 2017-06-15 NOTE — Progress Notes (Signed)
Post-Op Visit Note   Patient: [name]           Date of Birth: June 20, 1933           MRN: [id number] Visit Date: 06/15/2017 PCP: [facility], [facility]   Assessment & Plan: She will work on standing with a walker try to increase the length of time that she is able to stand.  She will try to work on taking a few steps.  X-rays were reviewed.  Chief Complaint:  Chief Complaint  Patient presents with  . Left Hip - Follow-up, Fracture   Visit Diagnoses:  1. Closed fracture of left femur with routine healing, unspecified fracture morphology, unspecified portion of femur, subsequent encounter     Plan: Patient can stand and try to increase the length of time she can stand or walk few steps with a walker.  She has a Girdlestone procedure with a short left femur.  Return as needed.  X-rays results were discussed with patient and family members as well as the patient.  The long femoral plate is somewhat prominent due to her Girdlestone procedure on the buttocks.  There is no skin erosion and she has a wheelchair seat pad cushion which is very soft.  Follow-Up Instructions: No Follow-up on file.   Orders:  Orders Placed This Encounter  Procedures  . XR FEMUR MIN 2 VIEWS LEFT   No orders of the defined types were placed in this encounter.   Imaging: No results found.  PMFS History: Patient Active Problem List   Diagnosis Date Noted  . Pressure injury of sacral region, stage 3 ([place]) 03/21/2017  . Dislocated hip, left, initial encounter ([place]) 03/19/2017  . Thoracic aortic atherosclerosis ([place]) 03/17/2017  . Pressure injury of skin 03/17/2017  . Femur fracture, left ([place]) 06/09/2012  . Acute blood loss anemia 05/24/2012  . Hyponatremia 05/20/2012  . Anemia, chronic disease 05/20/2012  . Left bundle branch block 05/20/2012  . Hip fracture ([place]) 05/19/2012  . Hypertension 05/19/2012   Past Medical History:  Diagnosis Date  . Anemia    takes Ferrous Gluconate daily  .  Anxiety   . Arthritis    "hands, feet, bad arthritis all over" (03/15/2017)  . Bell's palsy   . Depression   . Fall 03/15/2017   resulting in left hip dislocation and periprostatic femur fracture./notes 03/15/2017  . Falls frequently    "couple times here recently" (03/15/2017)  . GERD (gastroesophageal reflux disease)   . Heart murmur   . Hypertension    takes Lisinopril and Amlodipine daily  . Left bundle branch block   . Periprosthetic fracture of femur following total replacement of hip    Left  . Pressure injury of sacral region, stage 3 ([place]) 03/21/2017  . Thoracic aortic atherosclerosis ([place]) 03/17/2017  . Type II diabetes mellitus ([place])     History reviewed. No pertinent family history.  Past Surgical History:  Procedure Laterality Date  . APPENDECTOMY    . FRACTURE SURGERY    . HIP ARTHROPLASTY  05/20/2012   Procedure: ARTHROPLASTY BIPOLAR HIP;  Surgeon: Marybelle Killings, MD;  Location: [facility];  Service: Orthopedics;  Laterality: Left;  Left monopolar hemiarthroplasty  . ORIF FEMUR FRACTURE Left 03/17/2017   Procedure: REMOVAL OF HEMIARTHROPLASTY, GIRDLESTONE, OPEN REDUCTION INTERNAL FIXATION OF FEMUR FRACTURE, PLATING OF FEMUR FRACTURE;  Surgeon: Marybelle Killings, MD;  Location: [facility];  Service: Orthopedics;  Laterality: Left;  . TOTAL HIP REVISION Left 06/08/2012   Procedure: TOTAL  HIP REVISION;  Surgeon: Marybelle Killings, MD;  Location: [facility];  Service: Orthopedics;  Laterality: Left;  Revision left hip hemiarthroplasty for periprosthetic fracture  . TUBAL LIGATION     Social History   Occupational History  . Not on file  Tobacco Use  . Smoking status: Former Smoker    Packs/day: 3.00    Years: 19.00    Pack years: 57.00    Types: Cigarettes    Last attempt to quit: 05/20/1975    Years since quitting: 42.1  . Smokeless tobacco: Never Used  Substance and Sexual Activity  . Alcohol use: No  . Drug use: No  . Sexual activity: No

## 2017-06-20 ENCOUNTER — Emergency Department (HOSPITAL_COMMUNITY): Payer: Medicare Other

## 2017-06-20 ENCOUNTER — Encounter (HOSPITAL_COMMUNITY): Payer: Self-pay | Admitting: Emergency Medicine

## 2017-06-20 ENCOUNTER — Other Ambulatory Visit: Payer: Self-pay

## 2017-06-20 ENCOUNTER — Inpatient Hospital Stay (HOSPITAL_COMMUNITY)
Admission: EM | Admit: 2017-06-20 | Discharge: 2017-06-22 | DRG: 195 | Disposition: A | Discharge status: Home Health | Payer: Medicare Other | Attending: Internal Medicine | Admitting: Internal Medicine

## 2017-06-20 DIAGNOSIS — Z87891 Personal history of nicotine dependence: Secondary | ICD-10-CM

## 2017-06-20 DIAGNOSIS — K219 Gastro-esophageal reflux disease without esophagitis: Secondary | ICD-10-CM | POA: Diagnosis present

## 2017-06-20 DIAGNOSIS — Z7982 Long term (current) use of aspirin: Secondary | ICD-10-CM

## 2017-06-20 DIAGNOSIS — E119 Type 2 diabetes mellitus without complications: Secondary | ICD-10-CM | POA: Diagnosis present

## 2017-06-20 DIAGNOSIS — D649 Anemia, unspecified: Secondary | ICD-10-CM | POA: Diagnosis present

## 2017-06-20 DIAGNOSIS — I10 Essential (primary) hypertension: Secondary | ICD-10-CM | POA: Diagnosis present

## 2017-06-20 DIAGNOSIS — Z7189 Other specified counseling: Secondary | ICD-10-CM | POA: Diagnosis not present

## 2017-06-20 DIAGNOSIS — Z515 Encounter for palliative care: Secondary | ICD-10-CM | POA: Diagnosis present

## 2017-06-20 DIAGNOSIS — R0789 Other chest pain: Secondary | ICD-10-CM

## 2017-06-20 DIAGNOSIS — Z96643 Presence of artificial hip joint, bilateral: Secondary | ICD-10-CM | POA: Diagnosis present

## 2017-06-20 DIAGNOSIS — M1990 Unspecified osteoarthritis, unspecified site: Secondary | ICD-10-CM | POA: Diagnosis present

## 2017-06-20 DIAGNOSIS — R54 Age-related physical debility: Secondary | ICD-10-CM | POA: Diagnosis present

## 2017-06-20 DIAGNOSIS — Z79899 Other long term (current) drug therapy: Secondary | ICD-10-CM | POA: Diagnosis not present

## 2017-06-20 DIAGNOSIS — Y95 Nosocomial condition: Secondary | ICD-10-CM | POA: Diagnosis present

## 2017-06-20 DIAGNOSIS — F419 Anxiety disorder, unspecified: Secondary | ICD-10-CM | POA: Diagnosis present

## 2017-06-20 DIAGNOSIS — F329 Major depressive disorder, single episode, unspecified: Secondary | ICD-10-CM | POA: Diagnosis present

## 2017-06-20 DIAGNOSIS — J181 Lobar pneumonia, unspecified organism: Principal | ICD-10-CM | POA: Diagnosis present

## 2017-06-20 DIAGNOSIS — J189 Pneumonia, unspecified organism: Secondary | ICD-10-CM | POA: Diagnosis not present

## 2017-06-20 DIAGNOSIS — R079 Chest pain, unspecified: Secondary | ICD-10-CM | POA: Diagnosis present

## 2017-06-20 DIAGNOSIS — R296 Repeated falls: Secondary | ICD-10-CM | POA: Diagnosis present

## 2017-06-20 DIAGNOSIS — I340 Nonrheumatic mitral (valve) insufficiency: Secondary | ICD-10-CM | POA: Diagnosis not present

## 2017-06-20 LAB — COMPREHENSIVE METABOLIC PANEL
ALK PHOS: 95 U/L (ref 38–126)
ALT: 20 U/L (ref 14–54)
ANION GAP: 12 (ref 5–15)
AST: 24 U/L (ref 15–41)
Albumin: 3.5 g/dL (ref 3.5–5.0)
BILIRUBIN TOTAL: 0.7 mg/dL (ref 0.3–1.2)
BUN: 9 mg/dL (ref 6–20)
CO2: 23 mmol/L (ref 22–32)
Calcium: 10 mg/dL (ref 8.9–10.3)
Chloride: 94 mmol/L — ABNORMAL LOW (ref 101–111)
Creatinine, Ser: 0.46 mg/dL (ref 0.44–1.00)
GFR calc non Af Amer: >60 mL/min (ref >60)
Glucose, Bld: 111 mg/dL — ABNORMAL HIGH (ref 65–99)
Potassium: 3.9 mmol/L (ref 3.5–5.1)
SODIUM: 129 mmol/L — AB (ref 135–145)
TOTAL PROTEIN: 8.9 g/dL — AB (ref 6.5–8.1)

## 2017-06-20 LAB — URINALYSIS, ROUTINE W REFLEX MICROSCOPIC
Bilirubin Urine: NEGATIVE
Glucose, UA: NEGATIVE mg/dL
KETONES UR: NEGATIVE mg/dL
NITRITE: POSITIVE — AB
PROTEIN: 100 mg/dL — AB
Specific Gravity, Urine: >1.03 — ABNORMAL HIGH (ref 1.005–1.030)
pH: 6 (ref 5.0–8.0)

## 2017-06-20 LAB — URINALYSIS, MICROSCOPIC (REFLEX)

## 2017-06-20 LAB — TROPONIN I
Troponin I: <0.03 ng/mL (ref <0.03)
Troponin I: <0.03 ng/mL (ref <0.03)

## 2017-06-20 LAB — CBC WITH DIFFERENTIAL/PLATELET
BASOS PCT: 0 %
Basophils Absolute: 0 10*3/uL (ref 0.0–0.1)
EOS PCT: 2 %
Eosinophils Absolute: 0.1 10*3/uL (ref 0.0–0.7)
HCT: 37.7 % (ref 36.0–46.0)
Hemoglobin: 12.3 g/dL (ref 12.0–15.0)
Lymphocytes Relative: 17 %
Lymphs Abs: 0.9 10*3/uL (ref 0.7–4.0)
MCH: 26.9 pg (ref 26.0–34.0)
MCHC: 32.6 g/dL (ref 30.0–36.0)
MCV: 82.5 fL (ref 78.0–100.0)
MONO ABS: 0.3 10*3/uL (ref 0.1–1.0)
MONOS PCT: 5 %
Neutro Abs: 4.1 10*3/uL (ref 1.7–7.7)
Neutrophils Relative %: 76 %
Platelets: 299 10*3/uL (ref 150–400)
RBC: 4.57 MIL/uL (ref 3.87–5.11)
RDW: 15.2 % (ref 11.5–15.5)
WBC: 5.4 10*3/uL (ref 4.0–10.5)

## 2017-06-20 LAB — INFLUENZA PANEL BY PCR (TYPE A & B)
INFLAPCR: NEGATIVE
INFLBPCR: NEGATIVE

## 2017-06-20 LAB — MRSA PCR SCREENING: MRSA by PCR: POSITIVE — AB

## 2017-06-20 MED ORDER — POTASSIUM CHLORIDE CRYS ER 20 MEQ PO TBCR
20.0000 meq | EXTENDED_RELEASE_TABLET | Freq: Every day | ORAL | Status: DC
  Administered 2017-06-21 – 2017-06-22 (×2): 20 meq via ORAL
  Filled 2017-06-20 (×2): qty 1

## 2017-06-20 MED ORDER — ONDANSETRON HCL 4 MG/2ML IJ SOLN: 4.0000 mg | Freq: Four times a day (QID) | INTRAMUSCULAR | Status: DC | PRN

## 2017-06-20 MED ORDER — SODIUM CHLORIDE 0.9 % IV SOLN
INTRAVENOUS | Status: DC
  Administered 2017-06-20: 19:00:00 via INTRAVENOUS

## 2017-06-20 MED ORDER — VANCOMYCIN HCL IN DEXTROSE 1-5 GM/200ML-% IV SOLN
1000.0000 mg | Freq: Once | INTRAVENOUS | Status: AC
  Administered 2017-06-21: 1000 mg via INTRAVENOUS
  Filled 2017-06-20: qty 200

## 2017-06-20 MED ORDER — PANTOPRAZOLE SODIUM 40 MG PO TBEC
40.0000 mg | DELAYED_RELEASE_TABLET | Freq: Every day | ORAL | Status: DC
  Administered 2017-06-21 – 2017-06-22 (×2): 40 mg via ORAL
  Filled 2017-06-20 (×2): qty 1

## 2017-06-20 MED ORDER — MUPIROCIN 2 % EX OINT
1.0000 "application " | TOPICAL_OINTMENT | Freq: Two times a day (BID) | CUTANEOUS | Status: DC
  Administered 2017-06-21 – 2017-06-22 (×3): 1 via NASAL
  Filled 2017-06-20: qty 22

## 2017-06-20 MED ORDER — OXYBUTYNIN CHLORIDE 5 MG PO TABS
5.0000 mg | ORAL_TABLET | Freq: Two times a day (BID) | ORAL | Status: DC
  Administered 2017-06-20 – 2017-06-22 (×4): 5 mg via ORAL
  Filled 2017-06-20 (×4): qty 1

## 2017-06-20 MED ORDER — SODIUM CHLORIDE 0.9 % IV SOLN
2.0000 g | INTRAVENOUS | Status: DC
  Administered 2017-06-20 – 2017-06-21 (×2): 2 g via INTRAVENOUS
  Filled 2017-06-20 (×4): qty 2

## 2017-06-20 MED ORDER — MORPHINE SULFATE (PF) 2 MG/ML IV SOLN
2.0000 mg | Freq: Once | INTRAVENOUS | Status: AC
  Administered 2017-06-20: 2 mg via INTRAVENOUS
  Filled 2017-06-20: qty 1

## 2017-06-20 MED ORDER — ENOXAPARIN SODIUM 40 MG/0.4ML ~~LOC~~ SOLN
40.0000 mg | SUBCUTANEOUS | Status: DC
  Administered 2017-06-20 – 2017-06-21 (×2): 40 mg via SUBCUTANEOUS
  Filled 2017-06-20 (×2): qty 0.4

## 2017-06-20 MED ORDER — ACETAMINOPHEN 650 MG RE SUPP: 650.0000 mg | Freq: Four times a day (QID) | RECTAL | Status: DC | PRN

## 2017-06-20 MED ORDER — ACETAMINOPHEN 325 MG PO TABS: 650.0000 mg | ORAL_TABLET | Freq: Four times a day (QID) | ORAL | Status: DC | PRN

## 2017-06-20 MED ORDER — QUETIAPINE FUMARATE 25 MG PO TABS
25.0000 mg | ORAL_TABLET | Freq: Every day | ORAL | Status: DC
  Administered 2017-06-20 – 2017-06-21 (×2): 25 mg via ORAL
  Filled 2017-06-20 (×2): qty 1

## 2017-06-20 MED ORDER — AMITRIPTYLINE HCL 25 MG PO TABS
25.0000 mg | ORAL_TABLET | Freq: Every day | ORAL | Status: DC
  Administered 2017-06-20 – 2017-06-22 (×3): 25 mg via ORAL
  Filled 2017-06-20 (×3): qty 1

## 2017-06-20 MED ORDER — CHLORHEXIDINE GLUCONATE CLOTH 2 % EX PADS
6.0000 | MEDICATED_PAD | Freq: Every day | CUTANEOUS | Status: DC
  Administered 2017-06-21 – 2017-06-22 (×2): 6 via TOPICAL

## 2017-06-20 MED ORDER — ASPIRIN 81 MG PO CHEW
81.0000 mg | CHEWABLE_TABLET | Freq: Every day | ORAL | Status: DC
  Administered 2017-06-20 – 2017-06-22 (×3): 81 mg via ORAL
  Filled 2017-06-20 (×3): qty 1

## 2017-06-20 MED ORDER — LATANOPROST 0.005 % OP SOLN
1.0000 [drp] | Freq: Every day | OPHTHALMIC | Status: DC
  Administered 2017-06-21: 1 [drp] via OPHTHALMIC
  Filled 2017-06-20: qty 2.5

## 2017-06-20 MED ORDER — ONDANSETRON HCL 4 MG PO TABS: 4.0000 mg | ORAL_TABLET | Freq: Four times a day (QID) | ORAL | Status: DC | PRN

## 2017-06-20 MED ORDER — SENNOSIDES-DOCUSATE SODIUM 8.6-50 MG PO TABS
1.0000 | ORAL_TABLET | Freq: Every evening | ORAL | Status: DC | PRN
  Filled 2017-06-20: qty 1

## 2017-06-20 MED ORDER — NITROGLYCERIN 0.4 MG SL SUBL: 0.4000 mg | SUBLINGUAL_TABLET | SUBLINGUAL | Status: DC | PRN

## 2017-06-20 MED ORDER — DORZOLAMIDE HCL-TIMOLOL MAL 2-0.5 % OP SOLN
1.0000 [drp] | Freq: Two times a day (BID) | OPHTHALMIC | Status: DC
  Administered 2017-06-20 – 2017-06-21 (×2): 1 [drp] via OPHTHALMIC
  Filled 2017-06-20: qty 10

## 2017-06-20 MED ORDER — FERROUS SULFATE 325 (65 FE) MG PO TABS
325.0000 mg | ORAL_TABLET | Freq: Two times a day (BID) | ORAL | Status: DC
Start: 2017-06-20 — End: 2017-06-22
  Administered 2017-06-20 – 2017-06-22 (×4): 325 mg via ORAL
  Filled 2017-06-20 (×4): qty 1

## 2017-06-20 MED ORDER — LISINOPRIL 10 MG PO TABS
20.0000 mg | ORAL_TABLET | Freq: Every day | ORAL | Status: DC
  Administered 2017-06-21 – 2017-06-22 (×2): 20 mg via ORAL
  Filled 2017-06-20 (×2): qty 2

## 2017-06-20 NOTE — ED Notes (Signed)
Awaiting room assignment  

## 2017-06-20 NOTE — Progress Notes (Signed)
Pharmacy Note:  Initial antibiotic(s) regimen of Vancomycin ordered by EDP to treat pna.  Estimated Creatinine Clearance: 46.2 mL/min (by C-G formula based on SCr of 0.46 mg/dL).   No Known Allergies  Vitals:   06/20/17 1700 06/20/17 1730  BP: 132/68 123/76  Pulse: 84 95  Resp: 19 17  SpO2: 93% 93%    Anti-infectives (From admission, onward)   Start     Dose/Rate Route Frequency Ordered Stop   06/20/17 2000  ceFEPIme (MAXIPIME) 2 g in sodium chloride 0.9 % 100 mL IVPB     2 g 200 mL/hr over 30 Minutes Intravenous Every 24 hours 06/20/17 1800 06/28/17 1959   06/20/17 1830  vancomycin (VANCOCIN) IVPB 1000 mg/200 mL premix     1,000 mg 200 mL/hr over 60 Minutes Intravenous  Once 06/20/17 1825       Plan: Initial dose(s) of Vancomycin 1000mg  X 1 ordered. F/U admission orders for further dosing if therapy continued.  Ena Dawley, [facility] 06/20/2017 6:26 PM

## 2017-06-20 NOTE — H&P (Signed)
History and Physical    [name] WCH:[id number] DOB: 01-Dec-1933 DOA: 06/20/2017  Referring MD/NP/PA: Milton Ferguson, EDP PCP: Medicine, [facility] Internal  Patient coming from: Home  Chief Complaint: Cough, chest pain  HPI: [name] is a 84 y.o. female with history significant for fall in November with resultant hip fracture who has been nonambulatory since and spends most of her day either in bed or in her lift chair, hypertension, chronic indwelling Foley catheter due to her lack of mobility.  She presents to the hospital today with a 2-3-day history of worsening shortness of breath, cough productive of yellow sputum, subjective fever and chills although she has not actually taken her temperature as well as chest pain.  When I asked her to point to the one location on her chest that bothers her the most she points to her entire chest and is unable to localize it.  She does state that the pain radiates around her rib cage on both sides and up to her shoulders and is especially worse with coughing.  Vital signs are stable other than an oxygen saturation of 89% on room air upon arrival, labs are significant for a sodium of 129 otherwise unremarkable, UA shows too numerous to count WBCs with many bacteria and positive nitrite, chest x-ray shows a new patchy opacity in the mid to upper right lung suggestive of pneumonia.  Admission has been requested.  Past Medical/Surgical History: Past Medical History:  Diagnosis Date  . Anemia    takes Ferrous Gluconate daily  . Anxiety   . Arthritis    "hands, feet, bad arthritis all over" (03/15/2017)  . Bell's palsy   . Depression   . Fall 03/15/2017   resulting in left hip dislocation and periprostatic femur fracture./notes 03/15/2017  . Falls frequently    "couple times here recently" (03/15/2017)  . GERD (gastroesophageal reflux disease)   . Heart murmur   . Hypertension    takes Lisinopril and Amlodipine daily  . Left bundle branch block     . Periprosthetic fracture of femur following total replacement of hip    Left  . Pressure injury of sacral region, stage 3 ([place]) 03/21/2017  . Thoracic aortic atherosclerosis ([place]) 03/17/2017  . Type II diabetes mellitus ([place])     Past Surgical History:  Procedure Laterality Date  . APPENDECTOMY    . FRACTURE SURGERY    . HIP ARTHROPLASTY  05/20/2012   Procedure: ARTHROPLASTY BIPOLAR HIP;  Surgeon: Marybelle Killings, MD;  Location: [place];  Service: Orthopedics;  Laterality: Left;  Left monopolar hemiarthroplasty  . ORIF FEMUR FRACTURE Left 03/17/2017   Procedure: REMOVAL OF HEMIARTHROPLASTY, GIRDLESTONE, OPEN REDUCTION INTERNAL FIXATION OF FEMUR FRACTURE, PLATING OF FEMUR FRACTURE;  Surgeon: Marybelle Killings, MD;  Location: [place];  Service: Orthopedics;  Laterality: Left;  . TOTAL HIP REVISION Left 06/08/2012   Procedure: TOTAL HIP REVISION;  Surgeon: Marybelle Killings, MD;  Location: [place];  Service: Orthopedics;  Laterality: Left;  Revision left hip hemiarthroplasty for periprosthetic fracture  . TUBAL LIGATION      Social History:  reports that she quit smoking about 42 years ago. Her smoking use included cigarettes. She has a 57.00 pack-year smoking history. she has never used smokeless tobacco. She reports that she does not drink alcohol or use drugs.  Allergies: No Known Allergies  Family History:  No history of coronary artery disease, stroke, hypertension or diabetes in any family members that patient or patient's daughter are aware of.  Prior to Admission medications   Medication Sig Start Date End Date Taking? Authorizing Provider  acetaminophen (TYLENOL) 325 MG tablet Take 2 tablets (650 mg total) by mouth every 6 (six) hours as needed for mild pain. 03/21/17  Yes Samuella Cota, MD  amitriptyline (ELAVIL) 25 MG tablet Take 25-50 mg daily by mouth. 03/10/17  Yes [provider]  aspirin 81 MG chewable tablet Chew 81 mg by mouth daily.   Yes [provider]   dorzolamide-timolol (COSOPT) 22.3-6.8 MG/ML ophthalmic solution Place 1 drop 2 (two) times daily into both eyes. 03/10/17  Yes [provider]  ferrous sulfate 325 (65 FE) MG tablet Take 325 mg 2 (two) times daily by mouth. 02/02/17  Yes [provider]  KLOR-CON M20 20 MEQ tablet Take 20 mEq daily by mouth. with food 03/09/17  Yes [provider]  latanoprost (XALATAN) 0.005 % ophthalmic solution Place 1 drop at bedtime into both eyes. 03/09/17  Yes [provider]  lisinopril (PRINIVIL,ZESTRIL) 20 MG tablet Take 20 mg by mouth daily.   Yes [provider]  oxybutynin (DITROPAN) 5 MG tablet Take 5 mg 2 (two) times daily by mouth. 02/27/17  Yes [provider]  pantoprazole (PROTONIX) 40 MG tablet Take 40 mg daily before breakfast by mouth. 02/24/17  Yes [provider]  QUEtiapine (SEROQUEL) 25 MG tablet Take 25-50 mg at bedtime by mouth. 02/18/17  Yes [provider]    Review of Systems:  Constitutional: Denies  diaphoresis, appetite change and fatigue.  HEENT: Denies photophobia, eye pain, redness, hearing loss, ear pain, congestion, sore throat, rhinorrhea, sneezing, mouth sores, trouble swallowing, neck pain, neck stiffness and tinnitus.   Respiratory: Denies , chest tightness,  and wheezing.   Cardiovascular: Denies chest pain, palpitations and leg swelling.  Gastrointestinal: Denies nausea, vomiting, abdominal pain, diarrhea, constipation, blood in stool and abdominal distention.  Genitourinary: Denies dysuria, urgency, frequency, hematuria, flank pain and difficulty urinating.  Endocrine: Denies: hot or cold intolerance, sweats, changes in hair or nails, polyuria, polydipsia. Musculoskeletal: Denies myalgias, back pain, joint swelling, arthralgias and gait problem.  Skin: Denies pallor, rash and wound.  Neurological: Denies dizziness, seizures, syncope, weakness, light-headedness, numbness and headaches.   Hematological: Denies adenopathy. Easy bruising, personal or family bleeding history  Psychiatric/Behavioral: Denies suicidal ideation, mood changes, confusion, nervousness, sleep disturbance and agitation    Physical Exam: Vitals:   06/20/17 1600 06/20/17 1630 06/20/17 1700 06/20/17 1730  BP: 129/76 126/71 132/68 123/76  Pulse: 92 87 84 95  Resp: 20 19 19 17   SpO2: (!) 89% 91% 93% 93%  Weight:      Height:         Constitutional: NAD, calm, comfortable, cachectic, appears malnourished Eyes: PERRL, lids and conjunctivae normal ENMT: Mucous membranes are dry . Posterior pharynx clear of any exudate or lesions.Normal dentition.  Neck: normal, supple, no masses, no thyromegaly Respiratory: clear to auscultation bilaterally, no wheezing, no crackles. Normal respiratory effort. No accessory muscle use.  Cardiovascular: Regular rate and rhythm, no murmurs / rubs / gallops. No extremity edema. 2+ pedal pulses. No carotid bruits.  Abdomen: no tenderness, no masses palpated. No hepatosplenomegaly. Bowel sounds positive.  Musculoskeletal: Left leg is internally rotated and significantly shortened with a protuberance at the hip area that daughter states is a remaining metal fragment from her hip surgery.   Skin: no rashes, lesions, ulcers. No induration Neurologic: CN 2-12 grossly intact. Sensation intact, DTR normal. Strength 5/5 in all 4.  Psychiatric:  Normal judgment and insight. Alert and oriented x 3. Normal mood.    Labs on Admission: I have personally reviewed the following labs and imaging studies  CBC: Recent Labs  Lab 06/20/17 1020  WBC 5.4  NEUTROABS 4.1  HGB 12.3  HCT 37.7  MCV 82.5  PLT 458   Basic Metabolic Panel: Recent Labs  Lab 06/20/17 1020  NA 129*  K 3.9  CL 94*  CO2 23  GLUCOSE 111*  BUN 9  CREATININE 0.46  CALCIUM 10.0   GFR: Estimated Creatinine Clearance: 46.2 mL/min (by C-G formula based on SCr of 0.46 mg/dL). Liver Function Tests: Recent  Labs  Lab 06/20/17 1020  AST 24  ALT 20  ALKPHOS 95  BILITOT 0.7  PROT 8.9*  ALBUMIN 3.5   No results for input(s): LIPASE, AMYLASE in the last 168 hours. No results for input(s): AMMONIA in the last 168 hours. Coagulation Profile: No results for input(s): INR, PROTIME in the last 168 hours. Cardiac Enzymes: Recent Labs  Lab 06/20/17 1020  TROPONINI <0.03   BNP (last 3 results) No results for input(s): PROBNP in the last 8760 hours. HbA1C: No results for input(s): HGBA1C in the last 72 hours. CBG: No results for input(s): GLUCAP in the last 168 hours. Lipid Profile: No results for input(s): CHOL, HDL, LDLCALC, TRIG, CHOLHDL, LDLDIRECT in the last 72 hours. Thyroid Function Tests: No results for input(s): TSH, T4TOTAL, FREET4, T3FREE, THYROIDAB in the last 72 hours. Anemia Panel: No results for input(s): VITAMINB12, FOLATE, FERRITIN, TIBC, IRON, RETICCTPCT in the last 72 hours. Urine analysis:    Component Value Date/Time   COLORURINE YELLOW 06/20/2017 1425   APPEARANCEUR HAZY (A) 06/20/2017 1425   LABSPEC >1.030 (H) 06/20/2017 1425   PHURINE 6.0 06/20/2017 1425   GLUCOSEU NEGATIVE 06/20/2017 1425   HGBUR MODERATE (A) 06/20/2017 1425   BILIRUBINUR NEGATIVE 06/20/2017 1425   KETONESUR NEGATIVE 06/20/2017 1425   PROTEINUR 100 (A) 06/20/2017 1425   UROBILINOGEN 0.2 06/08/2012 1148   NITRITE POSITIVE (A) 06/20/2017 1425   LEUKOCYTESUR MODERATE (A) 06/20/2017 1425   Sepsis Labs: @LABRCNTIP (procalcitonin:4,lacticidven:4) )No results found for this or any previous visit (from the past 240 hour(s)).   Radiological Exams on Admission: Dg Chest 2 View  Result Date: 06/20/2017 CLINICAL DATA:  Dyspnea EXAM: CHEST  2 VIEW COMPARISON:  03/15/2017 chest radiograph. FINDINGS: Stable cardiomediastinal silhouette with normal heart size. No pneumothorax. No pleural effusion. New patchy opacity in the mid to upper right lung. IMPRESSION: New patchy opacity in the mid to upper right  lung suggestive of pneumonia. Recommend follow-up PA and lateral post treatment chest radiographs in 4-6 weeks. Electronically Signed   By: Ilona Sorrel M.D.   On: 06/20/2017 10:48    EKG: Independently reviewed. Normal sinus rhythm at a rate of 98, significant LVH  Assessment/Plan Principal Problem:   HCAP (healthcare-associated pneumonia) Active Problems:   Chest pain   Hypertension    Chest pain -Believe likely secondary to pneumonia and chest wall pain from constant coughing. -Nonetheless we will cycle enzymes last echo is from 2014, will repeat. -Do not anticipate further cardiac workup unless EF is significantly depressed or has wall motion abnormalities.  Hospital-acquired pneumonia/lobar pneumonia -She was in the hospital for a week less than 90 days ago and subsequently spent 30 days at a skilled nursing facility for rehab.  -start vancomycin and cefepime, check MRSA PCR and consider discontinuation of vancomycin if negative. -Culture data has been requested, strep pneumo and Legionella urine antigens  have also been requested. -Influenza PCR will be requested as well.  Questionable UTI -Patient has an indwelling Foley catheter, will await culture data before determining whether she has a UTI or not. -Nonetheless she is already on broad-spectrum antibiotic therapy for her pneumonia and this should cover any urinary pathogens as well.  Essential benign hypertension -Well-controlled, continue lisinopril.   DVT prophylaxis: Lovenox Code Status: Full code Family Communication: Daughter at bedside updated on plan of care and all questions answered Disposition Plan: Pending medical stability Consults called: None Admission status: Inpatient   Time Spent: 85 minutes  Estela Isaac Bliss MD [facility] Pager [phone]  If 7PM-7AM, please contact night-coverage [email or web address] Password [facility]  06/20/2017, 6:00 PM

## 2017-06-20 NOTE — ED Provider Notes (Signed)
[facility] EMERGENCY DEPARTMENT Provider Note   CSN: [id number] Arrival date & time: 06/20/17  6144     History   Chief Complaint Chief Complaint  Patient presents with  . Chest Pain    HPI [name] is a 84 y.o. female.  Patient states that she has been having chest pain for a few days and it got worse last night.  She has pain into both shoulders   The history is provided by the patient. No language interpreter was used.  Chest Pain   This is a new problem. The current episode started 12 to 24 hours ago. The problem occurs constantly. The problem has not changed since onset.The pain is associated with exertion. The pain is present in the substernal region. The pain is at a severity of 6/10. The pain is moderate. Pertinent negatives include no abdominal pain, no back pain, no cough and no headaches.  Pertinent negatives for past medical history include no seizures.    Past Medical History:  Diagnosis Date  . Anemia    takes Ferrous Gluconate daily  . Anxiety   . Arthritis    "hands, feet, bad arthritis all over" (03/15/2017)  . Bell's palsy   . Depression   . Fall 03/15/2017   resulting in left hip dislocation and periprostatic femur fracture./notes 03/15/2017  . Falls frequently    "couple times here recently" (03/15/2017)  . GERD (gastroesophageal reflux disease)   . Heart murmur   . Hypertension    takes Lisinopril and Amlodipine daily  . Left bundle branch block   . Periprosthetic fracture of femur following total replacement of hip    Left  . Pressure injury of sacral region, stage 3 ([place]) 03/21/2017  . Thoracic aortic atherosclerosis ([place]) 03/17/2017  . Type II diabetes mellitus [facility])     Patient Active Problem List   Diagnosis Date Noted  . Pressure injury of sacral region, stage 3 ([place]) 03/21/2017  . Dislocated hip, left, initial encounter ([place]) 03/19/2017  . Thoracic aortic atherosclerosis ([place]) 03/17/2017  . Pressure injury of skin 03/17/2017    . Femur fracture, left ([place]) 06/09/2012  . Acute blood loss anemia 05/24/2012  . Hyponatremia 05/20/2012  . Anemia, chronic disease 05/20/2012  . Left bundle branch block 05/20/2012  . Hip fracture ([place]) 05/19/2012  . Hypertension 05/19/2012    Past Surgical History:  Procedure Laterality Date  . APPENDECTOMY    . FRACTURE SURGERY    . HIP ARTHROPLASTY  05/20/2012   Procedure: ARTHROPLASTY BIPOLAR HIP;  Surgeon: Marybelle Killings, MD;  Location: [place];  Service: Orthopedics;  Laterality: Left;  Left monopolar hemiarthroplasty  . ORIF FEMUR FRACTURE Left 03/17/2017   Procedure: REMOVAL OF HEMIARTHROPLASTY, GIRDLESTONE, OPEN REDUCTION INTERNAL FIXATION OF FEMUR FRACTURE, PLATING OF FEMUR FRACTURE;  Surgeon: Marybelle Killings, MD;  Location: [place];  Service: Orthopedics;  Laterality: Left;  . TOTAL HIP REVISION Left 06/08/2012   Procedure: TOTAL HIP REVISION;  Surgeon: Marybelle Killings, MD;  Location: [place];  Service: Orthopedics;  Laterality: Left;  Revision left hip hemiarthroplasty for periprosthetic fracture  . TUBAL LIGATION      OB History    No data available       Home Medications    Prior to Admission medications   Medication Sig Start Date End Date Taking? Authorizing Provider  acetaminophen (TYLENOL) 325 MG tablet Take 2 tablets (650 mg total) by mouth every 6 (six) hours as needed for mild pain. 03/21/17  Yes  Samuella Cota, MD  amitriptyline (ELAVIL) 25 MG tablet Take 25-50 mg daily by mouth. 03/10/17  Yes [provider]  aspirin 81 MG chewable tablet Chew 81 mg by mouth daily.   Yes [provider]  dorzolamide-timolol (COSOPT) 22.3-6.8 MG/ML ophthalmic solution Place 1 drop 2 (two) times daily into both eyes. 03/10/17  Yes [provider]  ferrous sulfate 325 (65 FE) MG tablet Take 325 mg 2 (two) times daily by mouth. 02/02/17  Yes [provider]  KLOR-CON M20 20 MEQ tablet Take 20 mEq daily by mouth. with food 03/09/17  Yes [provider]  latanoprost (XALATAN) 0.005 % ophthalmic solution Place 1 drop at bedtime into both eyes. 03/09/17  Yes [provider]  lisinopril (PRINIVIL,ZESTRIL) 20 MG tablet Take 20 mg by mouth daily.   Yes [provider]  oxybutynin (DITROPAN) 5 MG tablet Take 5 mg 2 (two) times daily by mouth. 02/27/17  Yes [provider]  pantoprazole (PROTONIX) 40 MG tablet Take 40 mg daily before breakfast by mouth. 02/24/17  Yes [provider]  QUEtiapine (SEROQUEL) 25 MG tablet Take 25-50 mg at bedtime by mouth. 02/18/17  Yes [provider]    Family History No family history on file.  Social History Social History   Tobacco Use  . Smoking status: Former Smoker    Packs/day: 3.00    Years: 19.00    Pack years: 57.00    Types: Cigarettes    Last attempt to quit: 05/20/1975    Years since quitting: 42.1  . Smokeless tobacco: Never Used  Substance Use Topics  . Alcohol use: No  . Drug use: No     Allergies   Patient has no known allergies.   Review of Systems Review of Systems  Constitutional: Negative for appetite change and fatigue.  HENT: Negative for congestion, ear discharge and sinus pressure.   Eyes: Negative for discharge.  Respiratory: Negative for cough.   Cardiovascular: Positive for chest pain.  Gastrointestinal: Negative for abdominal pain and diarrhea.  Genitourinary: Negative for frequency and hematuria.  Musculoskeletal: Negative for back pain.  Skin: Negative for rash.  Neurological: Negative for seizures and headaches.  Psychiatric/Behavioral: Negative for hallucinations.     Physical Exam Updated Vital Signs BP (!) 149/85   Pulse 84   Resp 19   Ht 5\' 4"  (1.626 m)   Wt 53.1 kg (117 lb)   SpO2 91%   BMI 20.08 kg/m   Physical Exam  Constitutional: She is oriented to person, place, and time. She appears well-developed.  HENT:  Head: Normocephalic.  Eyes: Conjunctivae and EOM are normal. No scleral  icterus.  Neck: Neck supple. No thyromegaly present.  Cardiovascular: Normal rate and regular rhythm. Exam reveals no gallop and no friction rub.  No murmur heard. Pulmonary/Chest: No stridor. She has no wheezes. She has no rales. She exhibits no tenderness.  Abdominal: She exhibits no distension. There is no tenderness. There is no rebound.  Musculoskeletal: Normal range of motion. She exhibits no edema.  Lymphadenopathy:    She has no cervical adenopathy.  Neurological: She is oriented to person, place, and time. She exhibits normal muscle tone. Coordination normal.  Skin: No rash noted. No erythema.  Psychiatric: She has a normal mood and affect. Her behavior is normal.     ED Treatments / Results  Labs (all labs ordered are listed, but only abnormal results are displayed) Labs Reviewed  COMPREHENSIVE METABOLIC PANEL - Abnormal; Notable for  the following components:      Result Value   Sodium 129 (*)    Chloride 94 (*)    Glucose, Bld 111 (*)    Total Protein 8.9 (*)    All other components within normal limits  URINALYSIS, ROUTINE W REFLEX MICROSCOPIC - Abnormal; Notable for the following components:   APPearance HAZY (*)    Specific Gravity, Urine >1.030 (*)    Hgb urine dipstick MODERATE (*)    Protein, ur 100 (*)    Nitrite POSITIVE (*)    Leukocytes, UA MODERATE (*)    All other components within normal limits  URINALYSIS, MICROSCOPIC (REFLEX) - Abnormal; Notable for the following components:   Bacteria, UA MANY (*)    Squamous Epithelial / LPF 0-5 (*)    All other components within normal limits  CBC WITH DIFFERENTIAL/PLATELET  TROPONIN I    EKG  EKG Interpretation  Date/Time:  Sunday June 20 2017 09:46:15 EST Ventricular Rate:  98 PR Interval:    QRS Duration: 138 QT Interval:  387 QTC Calculation: 495 R Axis:   75 Text Interpretation:  Sinus rhythm Probable left atrial enlargement LVH with IVCD and secondary repol abnrm ST depr, consider ischemia,  inferior leads Anterior ST elevation, probably due to LVH Borderline prolonged QT interval Confirmed by Milton Ferguson [phone]) on 06/20/2017 9:57:57 AM       Radiology Dg Chest 2 View  Result Date: 06/20/2017 CLINICAL DATA:  Dyspnea EXAM: CHEST  2 VIEW COMPARISON:  03/15/2017 chest radiograph. FINDINGS: Stable cardiomediastinal silhouette with normal heart size. No pneumothorax. No pleural effusion. New patchy opacity in the mid to upper right lung. IMPRESSION: New patchy opacity in the mid to upper right lung suggestive of pneumonia. Recommend follow-up PA and lateral post treatment chest radiographs in 4-6 weeks. Electronically Signed   By: Ilona Sorrel M.D.   On: 06/20/2017 10:48    Procedures Procedures (including critical care time)  Medications Ordered in ED Medications  nitroGLYCERIN (NITROSTAT) SL tablet 0.4 mg (not administered)  morphine 2 MG/ML injection 2 mg (2 mg Intravenous Given 06/20/17 1014)     Initial Impression / Assessment and Plan / ED Course  I have reviewed the triage vital signs and the nursing notes.  Pertinent labs & imaging results that were available during my care of the patient were reviewed by me and considered in my medical decision making (see chart for details). Patient with chest pain.  Labs show troponin normal.  EKG shows LVH with inverted T waves.  Chest x-ray shows possible pneumonia.  I spoke with cardiology and Dr. Radford Pax felt like the patient will be appropriate for admission to [facility].  The patient will be admitted to the hospital by the hospital service for the chest pain and possible pneumonia     Final Clinical Impressions(s) / ED Diagnoses   Final diagnoses:  None    ED Discharge Orders    None       Milton Ferguson, MD 06/20/17 1552

## 2017-06-20 NOTE — ED Triage Notes (Addendum)
Patient c/o left side chest pain that radiates into left arm. Denies any shortness of breath, dizziness, nausea, or vomiting. Per family patient has had a productive cough with thick yellow sputum. Denies any fevers.Patient has hx of left bundle branch block.  Family also concerned patient may have a UTI. Patient has had a foley since November after having hip surgery-changed last week, recent hx of recurrent UTIs.

## 2017-06-20 NOTE — Progress Notes (Signed)
PHARMACY NOTE:  ANTIMICROBIAL RENAL DOSAGE ADJUSTMENT  Current antimicrobial regimen includes a mismatch between antimicrobial dosage and estimated renal function.  As per policy approved by the Pharmacy & Therapeutics and Medical Executive Committees, the antimicrobial dosage will be adjusted accordingly.  Current antimicrobial dosage:  Cefepime 1 gram IV q8h Indication: HCAP  Renal Function:  Estimated Creatinine Clearance: 46.2 mL/min (by C-G formula based on SCr of 0.46 mg/dL). []      On intermittent HD, scheduled: []      On CRRT    Antimicrobial dosage has been changed to:  Cefepime 2 gram IV q24h  Additional comments:   Thank you for allowing pharmacy to be a part of this patient's care.  Vernie Ammons, [facility] 06/20/2017 6:20 PM

## 2017-06-20 NOTE — ED Notes (Signed)
Report to Brittney, RN

## 2017-06-20 NOTE — ED Notes (Signed)
Family request a UA due to recurrent UTI

## 2017-06-21 ENCOUNTER — Inpatient Hospital Stay (HOSPITAL_COMMUNITY): Payer: Medicare Other

## 2017-06-21 ENCOUNTER — Other Ambulatory Visit: Payer: Self-pay

## 2017-06-21 DIAGNOSIS — I340 Nonrheumatic mitral (valve) insufficiency: Secondary | ICD-10-CM

## 2017-06-21 LAB — CBC
HEMATOCRIT: 34.5 % — AB (ref 36.0–46.0)
HEMOGLOBIN: 11 g/dL — AB (ref 12.0–15.0)
MCH: 26.8 pg (ref 26.0–34.0)
MCHC: 31.9 g/dL (ref 30.0–36.0)
MCV: 83.9 fL (ref 78.0–100.0)
Platelets: 288 10*3/uL (ref 150–400)
RBC: 4.11 MIL/uL (ref 3.87–5.11)
RDW: 15.6 % — ABNORMAL HIGH (ref 11.5–15.5)
WBC: 4.8 10*3/uL (ref 4.0–10.5)

## 2017-06-21 LAB — ECHOCARDIOGRAM COMPLETE
EERAT: 9.43
EWDT: 292 ms
FS: 42 % (ref 28–44)
Height: 64 in
IV/PV OW: 1.09
LA ID, A-P, ES: 21 mm
LA diam end sys: 21 mm
LA diam index: 1.4 cm/m2
LAVOLA4C: 25.1 mL
LDCA: 3.14 cm2
LV E/e' medial: 9.43
LV E/e'average: 9.43
LV PW d: 10.4 mm — AB (ref 0.6–1.1)
LV TDI E'LATERAL: 6.64
LVELAT: 6.64 cm/s
LVOT diameter: 20 mm
Lateral S' vel: 12.1 cm/s
MV Dec: 292
MV pk E vel: 62.6 m/s
MVPKAVEL: 66.5 m/s
RV TAPSE: 17.7 mm
Reg peak vel: 229 cm/s
TDI e' medial: 4.13
TR max vel: 229 cm/s
Weight: 1717.82 oz

## 2017-06-21 LAB — BASIC METABOLIC PANEL
ANION GAP: 7 (ref 5–15)
BUN: 15 mg/dL (ref 6–20)
CHLORIDE: 95 mmol/L — AB (ref 101–111)
CO2: 26 mmol/L (ref 22–32)
Calcium: 9 mg/dL (ref 8.9–10.3)
Creatinine, Ser: 0.57 mg/dL (ref 0.44–1.00)
GFR calc Af Amer: >60 mL/min (ref >60)
GFR calc non Af Amer: >60 mL/min (ref >60)
GLUCOSE: 92 mg/dL (ref 65–99)
POTASSIUM: 4.2 mmol/L (ref 3.5–5.1)
Sodium: 128 mmol/L — ABNORMAL LOW (ref 135–145)

## 2017-06-21 LAB — STREP PNEUMONIAE URINARY ANTIGEN: Strep Pneumo Urinary Antigen: NEGATIVE

## 2017-06-21 LAB — TROPONIN I
Troponin I: <0.03 ng/mL (ref <0.03)
Troponin I: <0.03 ng/mL (ref <0.03)

## 2017-06-21 MED ORDER — VANCOMYCIN HCL IN DEXTROSE 1-5 GM/200ML-% IV SOLN
1000.0000 mg | Freq: Once | INTRAVENOUS | Status: AC
  Administered 2017-06-21: 1000 mg via INTRAVENOUS
  Filled 2017-06-21: qty 200

## 2017-06-21 MED ORDER — VANCOMYCIN HCL IN DEXTROSE 750-5 MG/150ML-% IV SOLN
750.0000 mg | INTRAVENOUS | Status: DC
  Administered 2017-06-22: 750 mg via INTRAVENOUS
  Filled 2017-06-21 (×2): qty 150

## 2017-06-21 NOTE — Progress Notes (Signed)
PROGRESS NOTE    [name]  FIE:[id number] DOB: [birth year] DOA: 06/20/2017 PCP: Medicine, [facility]     Brief Narrative:  84 year old woman admitted from home on 2/24 following a 2-3-day history of worsening shortness of breath productive cough and subjective fevers and chills.  She has been nonambulatory with an indwelling Foley catheter since a fall in November resulted in a hip fracture.  She was found to have pneumonia and possibly UTI and admission was requested.   Assessment & Plan:   Principal Problem:   HCAP (healthcare-associated pneumonia) Active Problems:   Chest pain   Hypertension   Chest pain -Believe secondary to pneumonia and chest wall pain from coughing. -Has ruled out for ACS by negative troponins and EKG without acute ischemic changes. -Echo report reviewed: Ejection fraction of 55-60% with grade 1 diastolic dysfunction and normal wall motion. -No further cardiac workup anticipated.  Hospital-acquired pneumonia/lobar pneumonia -Continue vancomycin and cefepime, MRSA PCR was positive. -Influenza PCR has resulted negative. -Culture data is pending.  Strep pneumo urine antigen is negative.  Questionable UTI -Patient has an indwelling Foley catheter, will await culture prior to determining whether she has a UTI or not. -Nonetheless she will be on broad-spectrum antibiotic therapy for her pneumonia and that should cover any urinary pathogens as well.  Benign essential hypertension -Well-controlled, continue lisinopril.   DVT prophylaxis: Lovenox Code Status: Full code Family Communication: Son at bedside updated on plan of care and all questions answered Disposition Plan: Pending medical stability, will also request palliative care consultation given her frail state and poor long-term prognosis given her nonambulatory state  Consultants:   Palliative care pending  Procedures:   Echo as above  Antimicrobials:  Anti-infectives (From  admission, onward)   Start     Dose/Rate Route Frequency Ordered Stop   06/22/17 0600  vancomycin (VANCOCIN) IVPB 750 mg/150 ml premix     750 mg 150 mL/hr over 60 Minutes Intravenous Every 24 hours 06/21/17 0754     06/21/17 0800  vancomycin (VANCOCIN) IVPB 1000 mg/200 mL premix     1,000 mg 200 mL/hr over 60 Minutes Intravenous  Once 06/21/17 0753 06/21/17 0856   06/20/17 2000  ceFEPIme (MAXIPIME) 2 g in sodium chloride 0.9 % 100 mL IVPB     2 g 200 mL/hr over 30 Minutes Intravenous Every 24 hours 06/20/17 1800 06/28/17 1959   06/20/17 1830  vancomycin (VANCOCIN) IVPB 1000 mg/200 mL premix     1,000 mg 200 mL/hr over 60 Minutes Intravenous  Once 06/20/17 1825 06/21/17 1722       Subjective: States she feels much better less short of breath.  Denies chest pain.  Objective: Vitals:   06/20/17 2052 06/20/17 2055 06/21/17 0624 06/21/17 1416  BP:  (!) 123/55 114/60 (!) 120/53  Pulse:  72 62 72  Resp:    16  Temp:    98.5 F (36.9 C)  TempSrc:    Oral  SpO2: 92% 97% 95% 96%  Weight:      Height:        Intake/Output Summary (Last 24 hours) at 06/21/2017 1722 Last data filed at 06/21/2017 1710 Gross per 24 hour  Intake 1644.17 ml  Output 150 ml  Net 1494.17 ml   Filed Weights   06/20/17 0945 06/20/17 1827  Weight: 53.1 kg (117 lb) 48.7 kg (107 lb 5.8 oz)    Examination:  General exam: Alert, awake, oriented x 3 Respiratory system: Clear to auscultation. Respiratory effort normal. Cardiovascular system:RRR.  No murmurs, rubs, gallops. Gastrointestinal system: Abdomen is nondistended, soft and nontender. No organomegaly or masses felt. Normal bowel sounds heard. Central nervous system: Alert and oriented. No focal neurological deficits. Extremities: No C/C/E, +pedal pulses Skin: No rashes, lesions or ulcers Psychiatry: Judgement and insight appear normal. Mood & affect appropriate.     Data Reviewed: I have personally reviewed following labs and imaging  studies  CBC: Recent Labs  Lab 06/20/17 1020 06/21/17 0532  WBC 5.4 4.8  NEUTROABS 4.1  --   HGB 12.3 11.0*  HCT 37.7 34.5*  MCV 82.5 83.9  PLT 299 742   Basic Metabolic Panel: Recent Labs  Lab 06/20/17 1020 06/21/17 0532  NA 129* 128*  K 3.9 4.2  CL 94* 95*  CO2 23 26  GLUCOSE 111* 92  BUN 9 15  CREATININE 0.46 0.57  CALCIUM 10.0 9.0   GFR: Estimated Creatinine Clearance: 42.4 mL/min (by C-G formula based on SCr of 0.57 mg/dL). Liver Function Tests: Recent Labs  Lab 06/20/17 1020  AST 24  ALT 20  ALKPHOS 95  BILITOT 0.7  PROT 8.9*  ALBUMIN 3.5   No results for input(s): LIPASE, AMYLASE in the last 168 hours. No results for input(s): AMMONIA in the last 168 hours. Coagulation Profile: No results for input(s): INR, PROTIME in the last 168 hours. Cardiac Enzymes: Recent Labs  Lab 06/20/17 1020 06/20/17 1919 06/21/17 0029 06/21/17 0532  TROPONINI <0.03 <0.03 <0.03 <0.03   BNP (last 3 results) No results for input(s): PROBNP in the last 8760 hours. HbA1C: No results for input(s): HGBA1C in the last 72 hours. CBG: No results for input(s): GLUCAP in the last 168 hours. Lipid Profile: No results for input(s): CHOL, HDL, LDLCALC, TRIG, CHOLHDL, LDLDIRECT in the last 72 hours. Thyroid Function Tests: No results for input(s): TSH, T4TOTAL, FREET4, T3FREE, THYROIDAB in the last 72 hours. Anemia Panel: No results for input(s): VITAMINB12, FOLATE, FERRITIN, TIBC, IRON, RETICCTPCT in the last 72 hours. Urine analysis:    Component Value Date/Time   COLORURINE YELLOW 06/20/2017 1425   APPEARANCEUR HAZY (A) 06/20/2017 1425   LABSPEC >1.030 (H) 06/20/2017 1425   PHURINE 6.0 06/20/2017 1425   GLUCOSEU NEGATIVE 06/20/2017 1425   HGBUR MODERATE (A) 06/20/2017 1425   BILIRUBINUR NEGATIVE 06/20/2017 1425   KETONESUR NEGATIVE 06/20/2017 1425   PROTEINUR 100 (A) 06/20/2017 1425   UROBILINOGEN 0.2 06/08/2012 1148   NITRITE POSITIVE (A) 06/20/2017 1425    LEUKOCYTESUR MODERATE (A) 06/20/2017 1425   Sepsis Labs: @LABRCNTIP (procalcitonin:4,lacticidven:4)  ) Recent Results (from the past 240 hour(s))  Culture, blood (routine x 2) Call MD if unable to obtain prior to antibiotics being given     Status: None (Preliminary result)   Collection Time: 06/20/17  7:34 PM  Result Value Ref Range Status   Specimen Description LEFT ANTECUBITAL  Final   Special Requests   Final    BOTTLES DRAWN AEROBIC AND ANAEROBIC Blood Culture adequate volume   Culture   Final    NO GROWTH < 24 HOURS Performed at [facility], [address] [place], [place] [address]    Report Status PENDING  Incomplete  MRSA PCR Screening     Status: Abnormal   Collection Time: 06/20/17  7:36 PM  Result Value Ref Range Status   MRSA by PCR POSITIVE (A) NEGATIVE Final    Comment:        The GeneXpert MRSA Assay (FDA approved for NASAL specimens only), is one component of a comprehensive MRSA colonization  surveillance program. It is not intended to diagnose MRSA infection nor to guide or monitor treatment for MRSA infections. RESULT CALLED TO, READ BACK BY AND VERIFIED WITH: Nygard,B. 2125 ON 06/20/2017 BY EVA Performed at [facility], [address], [place], [place] [address]   Culture, blood (routine x 2) Call MD if unable to obtain prior to antibiotics being given     Status: None (Preliminary result)   Collection Time: 06/21/17 12:29 AM  Result Value Ref Range Status   Specimen Description LEFT ANTECUBITAL  Final   Special Requests   Final    BOTTLES DRAWN AEROBIC AND ANAEROBIC Blood Culture adequate volume   Culture   Final    NO GROWTH < 12 HOURS Performed at [facility], [address] [place], [place] [address]    Report Status PENDING  Incomplete         Radiology Studies: Dg Chest 2 View  Result Date: 06/20/2017 CLINICAL DATA:  Dyspnea EXAM: CHEST  2 VIEW COMPARISON:  03/15/2017 chest radiograph. FINDINGS: Stable cardiomediastinal silhouette  with normal heart size. No pneumothorax. No pleural effusion. New patchy opacity in the mid to upper right lung. IMPRESSION: New patchy opacity in the mid to upper right lung suggestive of pneumonia. Recommend follow-up PA and lateral post treatment chest radiographs in 4-6 weeks. Electronically Signed   By: Ilona Sorrel M.D.   On: 06/20/2017 10:48        Scheduled Meds: . amitriptyline  25 mg Oral Daily  . aspirin  81 mg Oral Daily  . Chlorhexidine Gluconate Cloth  6 each Topical Q0600  . dorzolamide-timolol  1 drop Both Eyes BID  . enoxaparin (LOVENOX) injection  40 mg Subcutaneous Q24H  . ferrous sulfate  325 mg Oral BID  . latanoprost  1 drop Both Eyes QHS  . lisinopril  20 mg Oral Daily  . mupirocin ointment  1 application Nasal BID  . oxybutynin  5 mg Oral BID  . pantoprazole  40 mg Oral QAC breakfast  . potassium chloride SA  20 mEq Oral Daily  . QUEtiapine  25 mg Oral QHS   Continuous Infusions: . sodium chloride 50 mL/hr at 06/20/17 1905  . ceFEPime (MAXIPIME) IV Stopped (06/20/17 2351)  . [START ON 06/22/2017] vancomycin       LOS: 1 day    Time spent: 25 minutes. Greater than 50% of this time was spent in direct contact with the patient coordinating care.     Lelon Frohlich, MD [facility] Pager [phone]  If 7PM-7AM, please contact night-coverage [email or web address] Password [facility] 06/21/2017, 5:22 PM

## 2017-06-21 NOTE — Progress Notes (Signed)
*  PRELIMINARY RESULTS* Echocardiogram 2D Echocardiogram has been performed.  [name] 06/21/2017, 4:22 PM

## 2017-06-21 NOTE — Progress Notes (Signed)
Pharmacy Antibiotic Note  Zipporah Finamore is a 84 y.o. female admitted on 06/20/2017 with pneumonia.  Pharmacy has been consulted for Vancomycin dosing.  Plan: Vancomycin 1000mg  x 1 then 750mg  IV q24hrs Cefepime 2gm IV q24hrs per MD Monitor labs, progress, c/s  Antimicrobials this admission: Cefepime 2/24 >>  Vancomycin 2/24 >>   Dose adjustments this admission:  Microbiology results:  BCx: pending  UCx: pending   Sputum: pending   MRSA PCR: positive  Height: 5\' 4"  (162.6 cm) Weight: 107 lb 5.8 oz (48.7 kg) IBW/kg (Calculated) : 54.7  Temp (24hrs), Avg:99.4 F (37.4 C), Min:99.4 F (37.4 C), Max:99.4 F (37.4 C)  Recent Labs  Lab 06/20/17 1020 06/21/17 0532  WBC 5.4 4.8  CREATININE 0.46 0.57    Estimated Creatinine Clearance: 42.4 mL/min (by C-G formula based on SCr of 0.57 mg/dL).    No Known Allergies  Thank you for allowing pharmacy to be a part of this patient's care.  Hart Robinsons A 06/21/2017 12:04 PM

## 2017-06-22 ENCOUNTER — Encounter (HOSPITAL_COMMUNITY): Payer: Self-pay | Admitting: Primary Care

## 2017-06-22 DIAGNOSIS — Z515 Encounter for palliative care: Secondary | ICD-10-CM

## 2017-06-22 DIAGNOSIS — Z7189 Other specified counseling: Secondary | ICD-10-CM

## 2017-06-22 DIAGNOSIS — J189 Pneumonia, unspecified organism: Secondary | ICD-10-CM

## 2017-06-22 LAB — LEGIONELLA PNEUMOPHILA SEROGP 1 UR AG: L. PNEUMOPHILA SEROGP 1 UR AG: NEGATIVE

## 2017-06-22 LAB — HIV ANTIBODY (ROUTINE TESTING W REFLEX): HIV Screen 4th Generation wRfx: NONREACTIVE

## 2017-06-22 MED ORDER — LEVOFLOXACIN 500 MG PO TABS: 500.0000 mg | ORAL_TABLET | Freq: Every day | ORAL | 0 refills | Status: AC

## 2017-06-22 MED ORDER — DOXYCYCLINE HYCLATE 100 MG PO TABS: 100.0000 mg | ORAL_TABLET | Freq: Two times a day (BID) | ORAL | 0 refills | Status: DC

## 2017-06-22 NOTE — Consult Note (Signed)
Consultation Note Date: 06/22/2017   Patient Name: [name]  DOB: 01/26/1934  MRN: [id number]  Age / Sex: 84 y.o., female  PCP: Medicine, [facility] Referring Physician: Isaac Bliss, Olam Idler*  Reason for Consultation: Establishing goals of care and Psychosocial/spiritual support  HPI/Patient Profile: 84 y.o. female  with past medical history of anemia, anxiety and depression, arthritis in the hands and feet, history of fall with left hip dislocation November 2018 surgical repair, hypertension, left bundle branch block, GERD, pressure injury of sacrum, type 2 diabetes, thoracic aortic atherosclerosis admitted on 06/20/2017 with healthcare acquired pneumonia.   Clinical Assessment and Goals of Care:  I have reviewed medical records including EPIC notes, labs and imaging, assessed the patient and then met at the bedside along with son [name] to discuss diagnosis prognosis, [place], EOL wishes, disposition and options.  I introduced Palliative Medicine as specialized medical care for people living with serious illness. It focuses on providing relief from the symptoms and stress of a serious illness. The goal is to improve quality of life for both the patient and the family.  We discussed a brief life review of the patient.  She was a homemaker.  Her significant other/husband of 40 years died 04/28/17.  She has 6 children, they have a schedule for caregiving.  As far as functional and nutritional status Mrs. [place] states she would not want a tube to feed her if she could not eat.  We talked about her frailty, her low weight.  Son [name] states that Mrs. [place] does not spend most of the day in bed.  They get her up to a Roff chair for a couple of hours per day, then she goes back to bed.   We discussed their current illness and what it means in the larger context of their on-going co-morbidities.   Natural disease trajectory and expectations at EOL were discussed.  I share a diagram of the chronic illness pathway, what is normal and expected.  The difference between aggressive medical intervention and comfort care was considered in light of the patient's goals of care.  We talked about the concept of treat the treatable but no extraordinary measures, no life support.  We talked about rehospitalization.  I ask about preferred place of death, Mrs. Coonrod states she has never considered this, but son [name] states that children's goal is that she passed away at home surrounded by family.  Advanced directives, concepts specific to code status (see below), artifical feeding and hydration, and rehospitalization were considered and discussed.  Questions and concerns were addressed.  The family was encouraged to call with questions or concerns.   HCPOA HCPOA - daughter, Zahriah Roes per son [name].    SUMMARY OF RECOMMENDATIONS   Continue to treat the treatable, continue code status discussion.   Code Status/Advance Care Planning:  Full code - we discuss the concept of treat the treatable, Mrs. [place] states she would not want life support, but only [name] is there to hear this, not Lattie Haw Udovich/HCPOA.  Symptom Management:   Per hospitalist, no additional needs.   Palliative Prophylaxis:   Frequent Pain Assessment and Turn Reposition  Additional Recommendations (Limitations, Scope, Preferences):  Full Scope Treatment  Psycho-social/Spiritual:   Desire for further Chaplaincy support:no  Additional Recommendations: Caregiving  Support/Resources  Prognosis:   Unable to determine, based on outcomes  Discharge Planning: return home [facility] services.       Primary Diagnoses: Present on Admission: . Chest pain . Hypertension   I have reviewed the medical record, interviewed the patient and family, and examined the patient. The following aspects are pertinent.  Past Medical  History:  Diagnosis Date  . Anemia    takes Ferrous Gluconate daily  . Anxiety   . Arthritis    "hands, feet, bad arthritis all over" (03/15/2017)  . Bell's palsy   . Depression   . Fall 03/15/2017   resulting in left hip dislocation and periprostatic femur fracture./notes 03/15/2017  . Falls frequently    "couple times here recently" (03/15/2017)  . GERD (gastroesophageal reflux disease)   . Heart murmur   . Hypertension    takes Lisinopril and Amlodipine daily  . Left bundle branch block   . Periprosthetic fracture of femur following total replacement of hip    Left  . Pressure injury of sacral region, stage 3 ([place]) 03/21/2017  . Thoracic aortic atherosclerosis ([place]) 03/17/2017  . Type II diabetes mellitus ([place])    Social History   Socioeconomic History  . Marital status: Widowed    Spouse name: None  . Number of children: None  . Years of education: None  . Highest education level: None  Social Needs  . Financial resource strain: None  . Food insecurity - worry: None  . Food insecurity - inability: None  . Transportation needs - medical: None  . Transportation needs - non-medical: None  Occupational History  . None  Tobacco Use  . Smoking status: Former Smoker    Packs/day: 3.00    Years: 19.00    Pack years: 57.00    Types: Cigarettes    Last attempt to quit: 05/20/1975    Years since quitting: 42.1  . Smokeless tobacco: Never Used  Substance and Sexual Activity  . Alcohol use: No  . Drug use: No  . Sexual activity: No  Other Topics Concern  . None  Social History Narrative  . None   History reviewed. No pertinent family history. Scheduled Meds: . amitriptyline  25 mg Oral Daily  . aspirin  81 mg Oral Daily  . Chlorhexidine Gluconate Cloth  6 each Topical Q0600  . dorzolamide-timolol  1 drop Both Eyes BID  . enoxaparin (LOVENOX) injection  40 mg Subcutaneous Q24H  . ferrous sulfate  325 mg Oral BID  . latanoprost  1 drop Both Eyes QHS  .  lisinopril  20 mg Oral Daily  . mupirocin ointment  1 application Nasal BID  . oxybutynin  5 mg Oral BID  . pantoprazole  40 mg Oral QAC breakfast  . potassium chloride SA  20 mEq Oral Daily  . QUEtiapine  25 mg Oral QHS   Continuous Infusions: . sodium chloride 50 mL/hr at 06/20/17 1905  . ceFEPime (MAXIPIME) IV Stopped (06/21/17 2142)  . vancomycin Stopped (06/22/17 1239)   PRN Meds:.acetaminophen **OR** acetaminophen, ondansetron **OR** ondansetron (ZOFRAN) IV, senna-docusate Medications Prior to Admission:  Prior to Admission medications   Medication Sig Start Date End Date Taking? Authorizing Provider  acetaminophen (TYLENOL) 325 MG tablet Take 2  tablets (650 mg total) by mouth every 6 (six) hours as needed for mild pain. 03/21/17  Yes Samuella Cota, MD  amitriptyline (ELAVIL) 25 MG tablet Take 25-50 mg daily by mouth. 03/10/17  Yes [provider]  aspirin 81 MG chewable tablet Chew 81 mg by mouth daily.   Yes [provider]  dorzolamide-timolol (COSOPT) 22.3-6.8 MG/ML ophthalmic solution Place 1 drop 2 (two) times daily into both eyes. 03/10/17  Yes [provider]  ferrous sulfate 325 (65 FE) MG tablet Take 325 mg 2 (two) times daily by mouth. 02/02/17  Yes [provider]  KLOR-CON M20 20 MEQ tablet Take 20 mEq daily by mouth. with food 03/09/17  Yes [provider]  latanoprost (XALATAN) 0.005 % ophthalmic solution Place 1 drop at bedtime into both eyes. 03/09/17  Yes [provider]  lisinopril (PRINIVIL,ZESTRIL) 20 MG tablet Take 20 mg by mouth daily.   Yes [provider]  oxybutynin (DITROPAN) 5 MG tablet Take 5 mg 2 (two) times daily by mouth. 02/27/17  Yes [provider]  pantoprazole (PROTONIX) 40 MG tablet Take 40 mg daily before breakfast by mouth. 02/24/17  Yes [provider]  QUEtiapine (SEROQUEL) 25 MG tablet Take 25-50 mg at bedtime by mouth. 02/18/17  Yes [provider]    No Known Allergies Review of Systems  Unable to perform ROS: Age    Physical Exam  Constitutional: She is oriented to person, place, and time.  Appears thin and frail, makes and briefly keeps eye contact.  HENT:  Head: Atraumatic.  Cardiovascular: Regular rhythm.  Pulmonary/Chest: Effort normal. No respiratory distress.  Musculoskeletal:       Right lower leg: She exhibits no edema.  Left hip displaced.  Neurological: She is alert and oriented to person, place, and time.  Skin: Skin is warm and dry.  Psychiatric: Her mood appears not anxious. She is not agitated.  Nursing note and vitals reviewed.   Vital Signs: BP (!) 128/51 (BP Location: Right Arm)   Pulse 73   Temp 98.5 F (36.9 C) (Oral)   Resp 16   Ht _0  (1.626 m)   Wt 48.7 kg (107 lb 5.8 oz)   SpO2 95%   BMI 18.43 kg/m  Pain Assessment: No/denies pain POSS *See Group Information*: 1-Acceptable,Awake and alert Pain Score: Asleep   SpO2: SpO2: 95 % O2 Device:SpO2: 95 % O2 Flow Rate: .   IO: Intake/output summary:   Intake/Output Summary (Last 24 hours) at 06/22/2017 1309 Last data filed at 06/22/2017 0900 Gross per 24 hour  Intake 1613.34 ml  Output 1550 ml  Net 63.34 ml    LBM: Last BM Date: 06/21/17 Baseline Weight: Weight: 53.1 kg (117 lb) Most recent weight: Weight: 48.7 kg (107 lb 5.8 oz)     Palliative Assessment/Data:   Flowsheet Rows     Most Recent Value  Intake Tab  Referral Department  Hospitalist  Unit at Time of Referral  Med/Surg Unit  Palliative Care Primary Diagnosis  Pulmonary  Date Notified  06/21/17  Palliative Care Type  New Palliative care  Reason for referral  Clarify Goals of Care  Date of Admission  06/20/17  Date first seen by Palliative Care  06/22/17  # of days Palliative referral response time  1 Day(s)  # of days IP prior to Palliative referral  1  Clinical Assessment  Palliative Performance Scale Score  30%  Pain Max last 24 hours  Not able to report  Pain  Min Last 24 hours  Not able to report  Dyspnea Max Last 24 Hours  Not able to report  Dyspnea Min Last 24 hours  Not able to report  Psychosocial & Spiritual Assessment  Palliative Care Outcomes  Patient/Family meeting held?  Yes      Time In: 1450 Time Out: 1600 Time Total: 70 minutes Greater than 50%  of this time was spent counseling and coordinating care related to the above assessment and plan.  Signed by: Drue Novel, NP   Please contact Palliative Medicine Team phone at [phone] for questions and concerns.  For individual provider: See Shea Evans

## 2017-06-22 NOTE — Care Management (Signed)
Patient Information   Patient Name Shaquala, Broeker ([id number]) Sex Female DOB [birth year]  Room Bed  A336 A336-01  Patient Demographics   Address [place] [address] Phone [phone] (Home)  Patient Ethnicity & Race   Ethnic Group Patient Race  Not Hispanic or Latino Black or African American  Emergency Contact(s)   Name Relation Home Work Mobile  [place] Daughter   [phone]  [facility] Daughter [phone]  [phone]  Enzo Bi Daughter   [phone]  Coren, [place]   [phone]  Documents on File    Status Date Received Description  Documents for the Patient  [place] Received 06/07/12   [place] E-Signature HIPAA Notice of Privacy Received 73/22/02   Driver's License Not Received    Insurance Card Received 06/07/12   Advance Directives/Living Will/HCPOA/POA Not Received    Insurance Card Not Received    Other Photo ID Not Received    [place] E-Signature HIPAA Notice of Privacy Spanish     Release of Information Not Received    Advanced Beneficiary Notice (ABN) Not Received    E-Signature AOB [place] Not Received    HIM ROI Authorization  05/20/17 UNSIGNED FMLA FORM  [employer] Authorization  05/20/17 SIGNED FMLA FORM  Documents for the Encounter  AOB (Assignment of Insurance Benefits) Not Received    E-signature AOB Signed 06/20/17   MEDICARE RIGHTS Not Received    E-signature Medicare Rights Signed 06/20/17   ED Patient Billing Extract   ED PB Billing Extract  EKG Received 06/21/17   Admission Information   Attending Provider Admitting Provider Admission Type Admission Date/Time  Isaac Bliss, Rayford Halsted, MD Isaac Bliss, Rayford Halsted, MD Emergency 06/20/17 0935  Discharge Date Hospital Service Auth/Cert Status Service Area   Internal Medicine Incomplete Mariano Colon  Unit Room/Bed Admission Status   AP-DEPT 300 A336/A336-01 Admission (Confirmed)    Admission   Complaint  Chest Pain  Hospital Account   Name Acct ID Class Status Primary Coverage  Antha, Niday [id number] Inpatient Open MEDICARE - MEDICARE PART A AND B      Guarantor Account (for Hospital Account [id number])   Name Relation to Pt Service Area Active? Acct Type  [name] Self CHSA Yes Personal/Family  Address Phone    [address] [place] [address] [phone])        Coverage Information (for Hospital Account [id number])   F/O Payor/Plan Precert #  MEDICARE/MEDICARE PART A AND B   Subscriber Subscriber #  Sharanya, Templin [id number]  Address Phone  [address] [place], [place] [address]

## 2017-06-22 NOTE — Care Management Note (Signed)
Case Management Note  Patient Details  Name: [name] MRN: [id number] Date of Birth: 05/05/1933  Subjective/Objective:         Admitted from home. Pt has 5 children and they provide 24/7 care.  Pt has been NWB for quite some time. Her son transfers her from bed to chair [facility] or recliner). She is active with [facility] for RN care. She was recently told by her orthopedist it is not anticipated she will recover more from her hip injuries, she is not supposed to do "PT" as that may be too much movement but she could attempt to stand as she can tolerate. Son in room and present for DC planning.             Action/Plan: Pt plans to return home with resumption of previous care giving arrangements. She is aware HH has 48 hrs to make resumption visit. CM will fax orders to resume [facility] once available, will call and verify receipt of orders.  Pt communicates no new needs or concerns about needs after discharge.  Expected Discharge Date:     06/22/2017             Expected Discharge Plan:  [place]  In-House Referral:  Hospice / Palliative Care  Discharge planning Services  CM Consult  Post Acute Care Choice:  Home Health, Resumption of Svcs/PTA Provider Choice offered to:  Patient  HH Arranged:  RN [facility] Agency:  [facility]  Status of Service:  Completed, signed off   Sherald Barge, RN 06/22/2017, 4:14 PM

## 2017-06-22 NOTE — Progress Notes (Signed)
Connie Billow Mays,RN reviewed discharge instructions with pt and family verb undertstanding. Pt left via wheelchair in NAD with all belongings.

## 2017-06-22 NOTE — Discharge Summary (Signed)
Physician Discharge Summary  [name] FXT:[id number] DOB: 1933-11-18 DOA: 06/20/2017  PCP: [facility]  Admit date: 06/20/2017 Discharge date: 06/22/2017  Time spent: 45 minutes  Recommendations for Outpatient Follow-up:  -To be discharged home today. -Will complete course of Levaquin and doxycycline for pneumonia plus minus UTI. -Advised to follow-up with primary care provider in 2 weeks.  Discharge Diagnoses:  Principal Problem:   HCAP (healthcare-associated pneumonia) Active Problems:   Chest pain   Hypertension   Goals of care, counseling/discussion   Palliative care by specialist   Discharge Condition: Stable and improved  Filed Weights   06/20/17 0945 06/20/17 1827  Weight: 53.1 kg (117 lb) 48.7 kg (107 lb 5.8 oz)    History of present illness:  [name] is a 84 y.o. female with history significant for fall in November with resultant hip fracture who has been nonambulatory since and spends most of her day either in bed or in her lift chair, hypertension, chronic indwelling Foley catheter due to her lack of mobility.  She presents to the hospital today with a 2-3-day history of worsening shortness of breath, cough productive of yellow sputum, subjective fever and chills although she has not actually taken her temperature as well as chest pain.  When I asked her to point to the one location on her chest that bothers her the most she points to her entire chest and is unable to localize it.  She does state that the pain radiates around her rib cage on both sides and up to her shoulders and is especially worse with coughing.  Vital signs are stable other than an oxygen saturation of 89% on room air upon arrival, labs are significant for a sodium of 129 otherwise unremarkable, UA shows too numerous to count WBCs with many bacteria and positive nitrite, chest x-ray shows a new patchy opacity in the mid to upper right lung suggestive of pneumonia.  Admission has  been requested.    Hospital Course:   Chest pain -Believe secondary to pneumonia and chest wall pain from coughing. -Has ruled out for ACS by negative troponins and EKG without acute ischemic changes. -Echo report reviewed: Ejection fraction of 55-60% with grade 1 diastolic dysfunction and normal wall motion. -No further cardiac workup anticipated.  Hospital-acquired pneumonia/lobar pneumonia -Clinically improved. -No further oxygen requirements. -Ok to DC home today on levaquin and doxycycline. Will add doxy due to positive MRSA PCR and possibility of MRSA PNA. 5 more days of abx on DC. -She has been on vanc/cefepime while hospitalized. -Cx data is pending.  Questionable UTI -Patient has an indwelling Foley catheter, cx is pending at time of DC. -Is being discharged with abx for PNA, which should cover possibility of UTI as well.  Benign essential hypertension -Well-controlled, continue lisinopril.    Procedures:  None   Consultations:  None  Discharge Instructions  Discharge Instructions    Diet - low sodium heart healthy   Complete by:  As directed    Increase activity slowly   Complete by:  As directed      Allergies as of 06/22/2017   No Known Allergies     Medication List    TAKE these medications   acetaminophen 325 MG tablet Commonly known as:  TYLENOL Take 2 tablets (650 mg total) by mouth every 6 (six) hours as needed for mild pain.   amitriptyline 25 MG tablet Commonly known as:  ELAVIL Take 25-50 mg daily by mouth.   aspirin 81  MG chewable tablet Chew 81 mg by mouth daily.   dorzolamide-timolol 22.3-6.8 MG/ML ophthalmic solution Commonly known as:  COSOPT Place 1 drop 2 (two) times daily into both eyes.   doxycycline 100 MG tablet Commonly known as:  VIBRA-TABS Take 1 tablet (100 mg total) by mouth 2 (two) times daily.   ferrous sulfate 325 (65 FE) MG tablet Take 325 mg 2 (two) times daily by mouth.   KLOR-CON M20 20 MEQ  tablet Generic drug:  potassium chloride SA Take 20 mEq daily by mouth. with food   latanoprost 0.005 % ophthalmic solution Commonly known as:  XALATAN Place 1 drop at bedtime into both eyes.   levofloxacin 500 MG tablet Commonly known as:  LEVAQUIN Take 1 tablet (500 mg total) by mouth daily for 10 days.   lisinopril 20 MG tablet Commonly known as:  PRINIVIL,ZESTRIL Take 20 mg by mouth daily.   oxybutynin 5 MG tablet Commonly known as:  DITROPAN Take 5 mg 2 (two) times daily by mouth.   pantoprazole 40 MG tablet Commonly known as:  PROTONIX Take 40 mg daily before breakfast by mouth.   QUEtiapine 25 MG tablet Commonly known as:  SEROQUEL Take 25-50 mg at bedtime by mouth.      No Known Allergies Follow-up Information    [facility]. Schedule an appointment as soon as possible for a visit in 2 week(s).   Contact information: [address] [place] [address] [phone]            The results of significant diagnostics from this hospitalization (including imaging, microbiology, ancillary and laboratory) are listed below for reference.    Significant Diagnostic Studies: Dg Chest 2 View  Result Date: 06/20/2017 CLINICAL DATA:  Dyspnea EXAM: CHEST  2 VIEW COMPARISON:  03/15/2017 chest radiograph. FINDINGS: Stable cardiomediastinal silhouette with normal heart size. No pneumothorax. No pleural effusion. New patchy opacity in the mid to upper right lung. IMPRESSION: New patchy opacity in the mid to upper right lung suggestive of pneumonia. Recommend follow-up PA and lateral post treatment chest radiographs in 4-6 weeks. Electronically Signed   By: Ilona Sorrel M.D.   On: 06/20/2017 10:48   Xr Femur Min 2 Views Left  Result Date: 06/15/2017 2 view x-rays left femur obtained this shows Girdlestone procedure with a long plate.  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  Cables and distal screws remain attached to the femur.  Impression post ORIF femur with Girdlestone procedure,, left.   Microbiology: Recent Results (from the past 240 hour(s))  Culture, blood (routine x 2) Call MD if unable to obtain prior to antibiotics being given     Status: None (Preliminary result)   Collection Time: 06/20/17  7:34 PM  Result Value Ref Range Status   Specimen Description LEFT ANTECUBITAL  Final   Special Requests   Final    BOTTLES DRAWN AEROBIC AND ANAEROBIC Blood Culture adequate volume   Culture   Final    NO GROWTH 2 DAYS Performed at [facility], [address] [place], [place] [address]    Report Status PENDING  Incomplete  MRSA PCR Screening     Status: Abnormal   Collection Time: 06/20/17  7:36 PM  Result Value Ref Range Status   MRSA by PCR POSITIVE (A) NEGATIVE Final    Comment:        The GeneXpert MRSA Assay (FDA approved for NASAL specimens only), is one component of a comprehensive MRSA colonization surveillance  program. It is not intended to diagnose MRSA infection nor to guide or monitor treatment for MRSA infections. RESULT CALLED TO, READ BACK BY AND VERIFIED WITH: Gracy,B. 2125 ON 06/20/2017 BY EVA Performed at [facility], [address], [place], [place] [address]   Culture, Urine     Status: None (Preliminary result)   Collection Time: 06/20/17 11:30 PM  Result Value Ref Range Status   Specimen Description   Final    URINE, CLEAN CATCH Performed at [facility], [address], [place], [place] [address]    Special Requests   Final    URINE, CLEAN CATCH Performed at [facility], [address], [place], [place] [address]    Culture   Final    CULTURE REINCUBATED FOR BETTER GROWTH Performed at [facility] [address], [place], [place] [address]    Report Status PENDING  Incomplete  Culture, blood (routine x 2) Call MD if unable to obtain prior to antibiotics being given     Status: None (Preliminary result)   Collection Time: 06/21/17 12:29 AM  Result Value Ref  Range Status   Specimen Description LEFT ANTECUBITAL  Final   Special Requests   Final    BOTTLES DRAWN AEROBIC AND ANAEROBIC Blood Culture adequate volume   Culture   Final    NO GROWTH 1 DAY Performed at [facility], [address], [place], [place] [address]    Report Status PENDING  Incomplete     Labs: Basic Metabolic Panel: Recent Labs  Lab 06/20/17 1020 06/21/17 0532  NA 129* 128*  K 3.9 4.2  CL 94* 95*  CO2 23 26  GLUCOSE 111* 92  BUN 9 15  CREATININE 0.46 0.57  CALCIUM 10.0 9.0   Liver Function Tests: Recent Labs  Lab 06/20/17 1020  AST 24  ALT 20  ALKPHOS 95  BILITOT 0.7  PROT 8.9*  ALBUMIN 3.5   No results for input(s): LIPASE, AMYLASE in the last 168 hours. No results for input(s): AMMONIA in the last 168 hours. CBC: Recent Labs  Lab 06/20/17 1020 06/21/17 0532  WBC 5.4 4.8  NEUTROABS 4.1  --   HGB 12.3 11.0*  HCT 37.7 34.5*  MCV 82.5 83.9  PLT 299 288   Cardiac Enzymes: Recent Labs  Lab 06/20/17 1020 06/20/17 1919 06/21/17 0029 06/21/17 0532  TROPONINI <0.03 <0.03 <0.03 <0.03   BNP: BNP (last 3 results) No results for input(s): BNP in the last 8760 hours.  ProBNP (last 3 results) No results for input(s): PROBNP in the last 8760 hours.  CBG: No results for input(s): GLUCAP in the last 168 hours.     Signed:  Lelon Frohlich  [facility] Pager: [phone] 06/22/2017, 4:48 PM

## 2017-06-25 LAB — URINE CULTURE

## 2017-06-25 LAB — CULTURE, BLOOD (ROUTINE X 2)
Culture: NO GROWTH
Special Requests: ADEQUATE

## 2017-06-26 LAB — CULTURE, BLOOD (ROUTINE X 2)
CULTURE: NO GROWTH
SPECIAL REQUESTS: ADEQUATE

## 2017-07-05 ENCOUNTER — Telehealth (INDEPENDENT_AMBULATORY_CARE_PROVIDER_SITE_OTHER): Payer: Self-pay | Admitting: Orthopaedic Surgery

## 2017-07-05 NOTE — Telephone Encounter (Signed)
noted 

## 2017-07-05 NOTE — Telephone Encounter (Signed)
I called. Best to skip PT with her condition. FYI

## 2017-07-05 NOTE — Telephone Encounter (Signed)
Beverlee Nims from [facility] called asking for a verbal approval on the patients PT. CB # [phone]

## 2017-07-05 NOTE — Telephone Encounter (Signed)
Please advise 

## 2017-07-30 ENCOUNTER — Telehealth (INDEPENDENT_AMBULATORY_CARE_PROVIDER_SITE_OTHER): Payer: Self-pay | Admitting: Orthopaedic Surgery

## 2017-07-30 NOTE — Telephone Encounter (Signed)
Please advise on FMLA paperwork. Patient's daughter states she will need intermittent leave due to her mother's condition and to care for her.

## 2017-07-30 NOTE — Telephone Encounter (Signed)
ucall and discuss.

## 2017-07-30 NOTE — Telephone Encounter (Signed)
Patient's daughter called stating that the 3 hours one time per month to take care of her mother is not enough because of where they live.  She needs it to state instead of specific hours, state intermediate FMLA.  CB#[phone].  Thank you.

## 2017-08-03 NOTE — Telephone Encounter (Signed)
I left voicemail for return call. 

## 2017-08-25 NOTE — Telephone Encounter (Signed)
No response from patient's daughter. Will wait for return call.

## 2017-09-02 ENCOUNTER — Other Ambulatory Visit: Payer: Self-pay

## 2017-09-02 ENCOUNTER — Inpatient Hospital Stay (HOSPITAL_COMMUNITY)
Admission: EM | Admit: 2017-09-02 | Discharge: 2017-09-05 | DRG: 699 | Disposition: A | Discharge status: Home / Self Care | Payer: Medicare Other | Attending: Internal Medicine | Admitting: Internal Medicine

## 2017-09-02 ENCOUNTER — Encounter (HOSPITAL_COMMUNITY): Payer: Self-pay | Admitting: Emergency Medicine

## 2017-09-02 DIAGNOSIS — I700 Atherosclerosis of aorta: Secondary | ICD-10-CM | POA: Diagnosis present

## 2017-09-02 DIAGNOSIS — F329 Major depressive disorder, single episode, unspecified: Secondary | ICD-10-CM | POA: Diagnosis present

## 2017-09-02 DIAGNOSIS — I10 Essential (primary) hypertension: Secondary | ICD-10-CM | POA: Diagnosis not present

## 2017-09-02 DIAGNOSIS — Y846 Urinary catheterization as the cause of abnormal reaction of the patient, or of later complication, without mention of misadventure at the time of the procedure: Secondary | ICD-10-CM | POA: Diagnosis present

## 2017-09-02 DIAGNOSIS — Z8249 Family history of ischemic heart disease and other diseases of the circulatory system: Secondary | ICD-10-CM | POA: Diagnosis not present

## 2017-09-02 DIAGNOSIS — T83511A Infection and inflammatory reaction due to indwelling urethral catheter, initial encounter: Secondary | ICD-10-CM | POA: Diagnosis not present

## 2017-09-02 DIAGNOSIS — R410 Disorientation, unspecified: Secondary | ICD-10-CM | POA: Diagnosis present

## 2017-09-02 DIAGNOSIS — Z9181 History of falling: Secondary | ICD-10-CM | POA: Diagnosis not present

## 2017-09-02 DIAGNOSIS — Z7982 Long term (current) use of aspirin: Secondary | ICD-10-CM

## 2017-09-02 DIAGNOSIS — Z87891 Personal history of nicotine dependence: Secondary | ICD-10-CM | POA: Diagnosis not present

## 2017-09-02 DIAGNOSIS — R000 Tachycardia, unspecified: Secondary | ICD-10-CM | POA: Diagnosis present

## 2017-09-02 DIAGNOSIS — B9620 Unspecified Escherichia coli [E. coli] as the cause of diseases classified elsewhere: Secondary | ICD-10-CM | POA: Diagnosis present

## 2017-09-02 DIAGNOSIS — M1990 Unspecified osteoarthritis, unspecified site: Secondary | ICD-10-CM | POA: Diagnosis present

## 2017-09-02 DIAGNOSIS — E871 Hypo-osmolality and hyponatremia: Secondary | ICD-10-CM | POA: Diagnosis not present

## 2017-09-02 DIAGNOSIS — N390 Urinary tract infection, site not specified: Secondary | ICD-10-CM | POA: Diagnosis present

## 2017-09-02 DIAGNOSIS — B952 Enterococcus as the cause of diseases classified elsewhere: Secondary | ICD-10-CM | POA: Diagnosis present

## 2017-09-02 DIAGNOSIS — F419 Anxiety disorder, unspecified: Secondary | ICD-10-CM | POA: Diagnosis present

## 2017-09-02 DIAGNOSIS — G9340 Encephalopathy, unspecified: Secondary | ICD-10-CM

## 2017-09-02 DIAGNOSIS — F418 Other specified anxiety disorders: Secondary | ICD-10-CM | POA: Diagnosis present

## 2017-09-02 DIAGNOSIS — Z96642 Presence of left artificial hip joint: Secondary | ICD-10-CM | POA: Diagnosis present

## 2017-09-02 DIAGNOSIS — K219 Gastro-esophageal reflux disease without esophagitis: Secondary | ICD-10-CM | POA: Diagnosis not present

## 2017-09-02 DIAGNOSIS — Z79899 Other long term (current) drug therapy: Secondary | ICD-10-CM | POA: Diagnosis not present

## 2017-09-02 DIAGNOSIS — I447 Left bundle-branch block, unspecified: Secondary | ICD-10-CM | POA: Diagnosis present

## 2017-09-02 DIAGNOSIS — E119 Type 2 diabetes mellitus without complications: Secondary | ICD-10-CM | POA: Diagnosis present

## 2017-09-02 DIAGNOSIS — Z993 Dependence on wheelchair: Secondary | ICD-10-CM | POA: Diagnosis not present

## 2017-09-02 DIAGNOSIS — R4182 Altered mental status, unspecified: Secondary | ICD-10-CM | POA: Diagnosis not present

## 2017-09-02 LAB — CBC WITH DIFFERENTIAL/PLATELET
Basophils Absolute: 0.1 10*3/uL (ref 0.0–0.1)
Basophils Relative: 1 %
Eosinophils Absolute: 0.1 10*3/uL (ref 0.0–0.7)
Eosinophils Relative: 3 %
HCT: 38.1 % (ref 36.0–46.0)
Hemoglobin: 12.3 g/dL (ref 12.0–15.0)
Lymphocytes Relative: 23 %
Lymphs Abs: 1.1 10*3/uL (ref 0.7–4.0)
MCH: 27.8 pg (ref 26.0–34.0)
MCHC: 32.3 g/dL (ref 30.0–36.0)
MCV: 86 fL (ref 78.0–100.0)
Monocytes Absolute: 0.6 10*3/uL (ref 0.1–1.0)
Monocytes Relative: 12 %
Neutro Abs: 3 10*3/uL (ref 1.7–7.7)
Neutrophils Relative %: 61 %
Platelets: 374 10*3/uL (ref 150–400)
RBC: 4.43 MIL/uL (ref 3.87–5.11)
RDW: 13.4 % (ref 11.5–15.5)
WBC: 4.9 10*3/uL (ref 4.0–10.5)

## 2017-09-02 LAB — URINALYSIS, ROUTINE W REFLEX MICROSCOPIC
BILIRUBIN URINE: NEGATIVE
GLUCOSE, UA: NEGATIVE mg/dL
Ketones, ur: NEGATIVE mg/dL
NITRITE: NEGATIVE
PROTEIN: 30 mg/dL — AB
Specific Gravity, Urine: 1.017 (ref 1.005–1.030)
pH: 5 (ref 5.0–8.0)

## 2017-09-02 LAB — COMPREHENSIVE METABOLIC PANEL
ALT: 17 U/L (ref 14–54)
AST: 23 U/L (ref 15–41)
Albumin: 3.4 g/dL — ABNORMAL LOW (ref 3.5–5.0)
Alkaline Phosphatase: 62 U/L (ref 38–126)
Anion gap: 7 (ref 5–15)
BUN: 29 mg/dL — ABNORMAL HIGH (ref 6–20)
CO2: 25 mmol/L (ref 22–32)
Calcium: 10.2 mg/dL (ref 8.9–10.3)
Chloride: 100 mmol/L — ABNORMAL LOW (ref 101–111)
Creatinine, Ser: 0.67 mg/dL (ref 0.44–1.00)
GFR calc Af Amer: >60 mL/min (ref >60)
GFR calc non Af Amer: >60 mL/min (ref >60)
Glucose, Bld: 112 mg/dL — ABNORMAL HIGH (ref 65–99)
Potassium: 4.9 mmol/L (ref 3.5–5.1)
Sodium: 132 mmol/L — ABNORMAL LOW (ref 135–145)
Total Bilirubin: 0.5 mg/dL (ref 0.3–1.2)
Total Protein: 8.9 g/dL — ABNORMAL HIGH (ref 6.5–8.1)

## 2017-09-02 MED ORDER — ONDANSETRON HCL 4 MG/2ML IJ SOLN: 4.0000 mg | Freq: Four times a day (QID) | INTRAMUSCULAR | Status: DC | PRN

## 2017-09-02 MED ORDER — SODIUM CHLORIDE 0.9 % IV BOLUS: 500.0000 mL | Freq: Once | INTRAVENOUS | Status: DC

## 2017-09-02 MED ORDER — SODIUM CHLORIDE 0.9 % IV SOLN
INTRAVENOUS | Status: DC
  Administered 2017-09-03 (×2): via INTRAVENOUS

## 2017-09-02 MED ORDER — LATANOPROST 0.005 % OP SOLN
1.0000 [drp] | Freq: Every day | OPHTHALMIC | Status: DC
  Administered 2017-09-03 – 2017-09-04 (×2): 1 [drp] via OPHTHALMIC
  Filled 2017-09-02: qty 2.5

## 2017-09-02 MED ORDER — LORAZEPAM 2 MG/ML IJ SOLN
0.5000 mg | Freq: Once | INTRAMUSCULAR | Status: AC
  Administered 2017-09-02: 0.5 mg via INTRAVENOUS
  Filled 2017-09-02: qty 1

## 2017-09-02 MED ORDER — QUETIAPINE FUMARATE 25 MG PO TABS
50.0000 mg | ORAL_TABLET | Freq: Every day | ORAL | Status: DC
  Administered 2017-09-03 – 2017-09-04 (×2): 50 mg via ORAL
  Filled 2017-09-02 (×2): qty 2

## 2017-09-02 MED ORDER — ALPRAZOLAM 0.25 MG PO TABS
0.2500 mg | ORAL_TABLET | Freq: Every evening | ORAL | Status: DC | PRN
  Administered 2017-09-03 – 2017-09-04 (×2): 0.25 mg via ORAL
  Filled 2017-09-02 (×2): qty 1

## 2017-09-02 MED ORDER — SODIUM CHLORIDE 0.9 % IV SOLN
1.0000 g | Freq: Once | INTRAVENOUS | Status: AC
  Administered 2017-09-03: 1 g via INTRAVENOUS
  Filled 2017-09-02: qty 10

## 2017-09-02 MED ORDER — PANTOPRAZOLE SODIUM 40 MG PO TBEC
40.0000 mg | DELAYED_RELEASE_TABLET | Freq: Every day | ORAL | Status: DC
  Administered 2017-09-03 – 2017-09-05 (×3): 40 mg via ORAL
  Filled 2017-09-02 (×3): qty 1

## 2017-09-02 MED ORDER — LORAZEPAM 2 MG/ML IJ SOLN
0.5000 mg | Freq: Once | INTRAMUSCULAR | Status: AC
  Administered 2017-09-02: 0.5 mg via INTRAVENOUS

## 2017-09-02 MED ORDER — DORZOLAMIDE HCL-TIMOLOL MAL 2-0.5 % OP SOLN
1.0000 [drp] | Freq: Two times a day (BID) | OPHTHALMIC | Status: DC
  Administered 2017-09-03 – 2017-09-05 (×3): 1 [drp] via OPHTHALMIC
  Filled 2017-09-02: qty 10

## 2017-09-02 MED ORDER — ACETAMINOPHEN 325 MG PO TABS: 650.0000 mg | ORAL_TABLET | Freq: Four times a day (QID) | ORAL | Status: DC | PRN

## 2017-09-02 MED ORDER — ONDANSETRON HCL 4 MG PO TABS: 4.0000 mg | ORAL_TABLET | Freq: Four times a day (QID) | ORAL | Status: DC | PRN

## 2017-09-02 MED ORDER — POTASSIUM CHLORIDE CRYS ER 20 MEQ PO TBCR
20.0000 meq | EXTENDED_RELEASE_TABLET | Freq: Every day | ORAL | Status: DC
  Administered 2017-09-04 – 2017-09-05 (×2): 20 meq via ORAL
  Filled 2017-09-02 (×3): qty 1

## 2017-09-02 MED ORDER — LISINOPRIL 10 MG PO TABS
20.0000 mg | ORAL_TABLET | Freq: Every day | ORAL | Status: DC
  Administered 2017-09-03 – 2017-09-05 (×3): 20 mg via ORAL
  Filled 2017-09-02 (×3): qty 2

## 2017-09-02 MED ORDER — ASPIRIN 81 MG PO CHEW
81.0000 mg | CHEWABLE_TABLET | Freq: Every day | ORAL | Status: DC
  Administered 2017-09-03 – 2017-09-05 (×3): 81 mg via ORAL
  Filled 2017-09-02 (×3): qty 1

## 2017-09-02 MED ORDER — LORAZEPAM 2 MG/ML IJ SOLN
INTRAMUSCULAR | Status: AC
  Administered 2017-09-02: 0.5 mg via INTRAVENOUS
  Filled 2017-09-02: qty 1

## 2017-09-02 MED ORDER — FERROUS SULFATE 325 (65 FE) MG PO TABS
325.0000 mg | ORAL_TABLET | Freq: Two times a day (BID) | ORAL | Status: DC
  Administered 2017-09-03 – 2017-09-05 (×4): 325 mg via ORAL
  Filled 2017-09-02 (×5): qty 1

## 2017-09-02 MED ORDER — ENOXAPARIN SODIUM 40 MG/0.4ML ~~LOC~~ SOLN
40.0000 mg | SUBCUTANEOUS | Status: DC
  Administered 2017-09-03 – 2017-09-04 (×2): 40 mg via SUBCUTANEOUS
  Filled 2017-09-02 (×3): qty 0.4

## 2017-09-02 NOTE — ED Notes (Addendum)
Pt refusing vital signs and tele. Uncooperative/combative

## 2017-09-02 NOTE — ED Provider Notes (Signed)
[facility] EMERGENCY DEPARTMENT Provider Note   CSN: [id number] Arrival date & time: 09/02/17  1648     History   Chief Complaint Chief Complaint  Patient presents with  . Urinary Tract Infection    HPI [name] is a 84 y.o. female.  HPI Patient presents with confusion and some mental status changes.  Has chronic Foley catheter in place and states it is been more cloudy.  Patient is speaking much more than normal and has some more confusion.  No fevers.  No cough.  No chest pain.  Has been doing worse over the last couple weeks.  No trauma.  No fall.  Patient will not be able to take pills at home in the condition that she is in.  History comes from her daughter primarily. Past Medical History:  Diagnosis Date  . Anemia    takes Ferrous Gluconate daily  . Anxiety   . Arthritis    "hands, feet, bad arthritis all over" (03/15/2017)  . Bell's palsy   . Depression   . Fall 03/15/2017   resulting in left hip dislocation and periprostatic femur fracture./notes 03/15/2017  . Falls frequently    "couple times here recently" (03/15/2017)  . GERD (gastroesophageal reflux disease)   . Heart murmur   . Hypertension    takes Lisinopril and Amlodipine daily  . Left bundle branch block   . Periprosthetic fracture of femur following total replacement of hip    Left  . Pressure injury of sacral region, stage 3 ([place]) 03/21/2017  . Thoracic aortic atherosclerosis ([place]) 03/17/2017  . Type II diabetes mellitus [facility])     Patient Active Problem List   Diagnosis Date Noted  . Goals of care, counseling/discussion   . Palliative care by specialist   . Chest pain 06/20/2017  . HCAP (healthcare-associated pneumonia) 06/20/2017  . Pressure injury of sacral region, stage 3 ([place]) 03/21/2017  . Dislocated hip, left, initial encounter ([place]) 03/19/2017  . Thoracic aortic atherosclerosis ([place]) 03/17/2017  . Pressure injury of skin 03/17/2017  . Femur fracture, left ([place]) 06/09/2012  . Acute  blood loss anemia 05/24/2012  . Hyponatremia 05/20/2012  . Anemia, chronic disease 05/20/2012  . Left bundle branch block 05/20/2012  . Hip fracture ([place]) 05/19/2012  . Hypertension 05/19/2012    Past Surgical History:  Procedure Laterality Date  . APPENDECTOMY    . FRACTURE SURGERY    . HIP ARTHROPLASTY  05/20/2012   Procedure: ARTHROPLASTY BIPOLAR HIP;  Surgeon: Marybelle Killings, MD;  Location: [facility];  Service: Orthopedics;  Laterality: Left;  Left monopolar hemiarthroplasty  . ORIF FEMUR FRACTURE Left 03/17/2017   Procedure: REMOVAL OF HEMIARTHROPLASTY, GIRDLESTONE, OPEN REDUCTION INTERNAL FIXATION OF FEMUR FRACTURE, PLATING OF FEMUR FRACTURE;  Surgeon: Marybelle Killings, MD;  Location: [facility];  Service: Orthopedics;  Laterality: Left;  . TOTAL HIP REVISION Left 06/08/2012   Procedure: TOTAL HIP REVISION;  Surgeon: Marybelle Killings, MD;  Location: [facility];  Service: Orthopedics;  Laterality: Left;  Revision left hip hemiarthroplasty for periprosthetic fracture  . TUBAL LIGATION       OB History   None      Home Medications    Prior to Admission medications   Medication Sig Start Date End Date Taking? Authorizing Provider  ALPRAZolam Duanne Moron) 0.25 MG tablet Take 0.25 mg by mouth at bedtime as needed for anxiety.   Yes [provider]  amitriptyline (ELAVIL) 25 MG tablet Take 25-50 mg daily by mouth. 03/10/17  Yes  [provider]  aspirin 81 MG chewable tablet Chew 81 mg by mouth daily.   Yes [provider]  dorzolamide-timolol (COSOPT) 22.3-6.8 MG/ML ophthalmic solution Place 1 drop 2 (two) times daily into both eyes. 03/10/17  Yes [provider]  ferrous sulfate 325 (65 FE) MG tablet Take 325 mg 2 (two) times daily by mouth. 02/02/17  Yes [provider]  KLOR-CON M20 20 MEQ tablet Take 20 mEq daily by mouth. with food 03/09/17  Yes [provider]  latanoprost (XALATAN) 0.005 % ophthalmic solution Place 1 drop at bedtime into both eyes.  03/09/17  Yes [provider]  lisinopril (PRINIVIL,ZESTRIL) 20 MG tablet Take 20 mg by mouth daily.   Yes [provider]  QUEtiapine (SEROQUEL) 25 MG tablet Take 25-50 mg at bedtime by mouth. 02/18/17  Yes [provider]  acetaminophen (TYLENOL) 325 MG tablet Take 2 tablets (650 mg total) by mouth every 6 (six) hours as needed for mild pain. 03/21/17   Samuella Cota, MD  nitrofurantoin (MACRODANTIN) 100 MG capsule 1 CAPSULE WITH FOOD OR MILK TWICE A DAY ORALLY 7 DAYS 08/04/17   [provider]  pantoprazole (PROTONIX) 40 MG tablet Take 40 mg daily before breakfast by mouth. 02/24/17   [provider]  sulfamethoxazole-trimethoprim (BACTRIM DS,SEPTRA DS) 800-160 MG tablet Take 1 tablet by mouth 2 (two) times daily. for 10 days 08/19/17   [provider]    Family History No family history on file.  Social History Social History   Tobacco Use  . Smoking status: Former Smoker    Packs/day: 3.00    Years: 19.00    Pack years: 57.00    Types: Cigarettes    Last attempt to quit: 05/20/1975    Years since quitting: 42.3  . Smokeless tobacco: Never Used  Substance Use Topics  . Alcohol use: No  . Drug use: No     Allergies   Patient has no known allergies.   Review of Systems Review of Systems  Unable to perform ROS: Mental status change     Physical Exam Updated Vital Signs BP (!) 170/82 (BP Location: Left Arm)   Pulse (!) 117   Temp 98.3 F (36.8 C) (Axillary)   Resp 18   Ht 5\' 6"  (1.676 m)   Wt 48.5 kg (107 lb)   BMI 17.27 kg/m   Physical Exam  Constitutional: She appears well-developed.  HENT:  Head: Atraumatic.  Eyes: Pupils are equal, round, and reactive to light.  Neck: Neck supple.  Cardiovascular: Normal rate.  Pulmonary/Chest: She has no wheezes. She exhibits no tenderness.  Genitourinary:  Genitourinary Comments: Foley catheter in place.  Musculoskeletal: She exhibits no tenderness.  Both  lower extremities rotated left lower extremity rotated internally right lower extremity rotated externally.  Neurological: She is alert.  Pressured speech.  Mild confusion.  Able to recognize family.  Somewhat rambling.  Skin: Skin is warm. Capillary refill takes less than 2 seconds.     ED Treatments / Results  Labs (all labs ordered are listed, but only abnormal results are displayed) Labs Reviewed  COMPREHENSIVE METABOLIC PANEL - Abnormal; Notable for the following components:      Result Value   Sodium 132 (*)    Chloride 100 (*)    Glucose, Bld 112 (*)    BUN 29 (*)    Total Protein 8.9 (*)    Albumin 3.4 (*)    All other components within normal limits  URINALYSIS,  ROUTINE W REFLEX MICROSCOPIC - Abnormal; Notable for the following components:   APPearance CLOUDY (*)    Hgb urine dipstick LARGE (*)    Protein, ur 30 (*)    Leukocytes, UA LARGE (*)    RBC / HPF >50 (*)    WBC, UA >50 (*)    Bacteria, UA MANY (*)    All other components within normal limits  URINE CULTURE  CBC WITH DIFFERENTIAL/PLATELET    EKG None  Radiology No results found.  Procedures Procedures (including critical care time)  Medications Ordered in ED Medications  cefTRIAXone (ROCEPHIN) 1 g in sodium chloride 0.9 % 100 mL IVPB (has no administration in time range)     Initial Impression / Assessment and Plan / ED Course  I have reviewed the triage vital signs and the nursing notes.  Pertinent labs & imaging results that were available during my care of the patient were reviewed by me and considered in my medical decision making (see chart for details).    Patient presents with some encephalopathy.  Chronic indwelling Foley catheter.  Increased confusion.  Urinalysis shows infection.  Per patient's family member and caregiver she will not be able to manage this at home since she will not take her medicines.  Will admit to hospitalist for IV medication.  Final Clinical Impressions(s) / ED  Diagnoses   Final diagnoses:  Urinary tract infection associated with indwelling urethral catheter, initial encounter [facility])  Encephalopathy    ED Discharge Orders    None       Davonna Belling, MD 09/02/17 2015

## 2017-09-02 NOTE — ED Notes (Addendum)
Pt refused IV rocephin as well as refused vital signs.  Dr Alvino Chapel notified.

## 2017-09-02 NOTE — H&P (Signed)
History and Physical    [name] IFO:[id number] DOB: 11-17-1933 DOA: 09/02/2017  PCP: Medicine, [facility]  Patient coming from: Home.  I have personally briefly reviewed patient's old medical records in [place]  Chief Complaint: Confusion, weakness and change in urine odor.  HPI: [name] is a 84 y.o. female with medical history significant of history of iron deficiency anemia, anxiety, osteoarthritis, history of Bell's palsy, depression, history of falls with left hip dislocation and periprostatic femur fracture, GERD, hypertension, left bundle branch block, pressure injury of sacral region, thoracic aortic atherosclerosis, type 2 diabetes who is being brought to the emergency department by her daughter due to progressively worse confusion, weakness, malodorous and cloudy urine for the past 2 weeks associated with decreased appetite and low-grade temperature.  The patient is talking incessantly, but states that she does not have any pain or discomfort at this time.  However, it is difficult to obtain a reliable review of systems since the patient has been denying every symptom.  Her daughter states that she gets like this when she has an urinary tract infection.  Last month she was given an antibiotic course uneventfully, except for mildly loose stools while taking it.  ED Course: Initial vital signs temperature 97.4 F, pulse 117, respirations 18, blood pressure 200/152 mmHg.  No pulse oximetry available.  The patient declined pulse oximetry check, when I was interviewing her daughter and examining her.  Ceftriaxone 1 g IVPB was ordered for her, but the patient has been very restless and declining to be given medications.  I ordered lorazepam 0.5 mg x 1 with minimal results.  A second dose has been ordered so we can hang the ceftriaxone 1 g back.  Urinalysis shows a urine with a cloudy appearance, large hemoglobinuria, 30 mg/dL of proteinuria, large leukocyte esterase, more  than 50 RBC, more than 50 WBC per hpf and many bacteria.  Her CBC shows an white count of 4.9 with a normal differential.  Hemoglobin 12.3 g/dL and platelets 374.  Sodium was 132, potassium 4.9, chloride 100 and CO2 25 mmol/L.  BUN 29, creatinine 0.67, calcium 10.2 and glucose 112 mg/dL.  Total protein was elevated at 8.9 and albumin mildly decreased at 3.4 g/dL.  The rest of the liver function tests were within normal limits.  Review of Systems: Unable to fully obtain from the patient..    Past Medical History:  Diagnosis Date  . Anemia    takes Ferrous Gluconate daily  . Anxiety   . Arthritis    "hands, feet, bad arthritis all over" (03/15/2017)  . Bell's palsy   . Depression   . Fall 03/15/2017   resulting in left hip dislocation and periprostatic femur fracture./notes 03/15/2017  . Falls frequently    "couple times here recently" (03/15/2017)  . GERD (gastroesophageal reflux disease)   . Heart murmur   . Hypertension    takes Lisinopril and Amlodipine daily  . Left bundle branch block   . Periprosthetic fracture of femur following total replacement of hip    Left  . Pressure injury of sacral region, stage 3 ([place]) 03/21/2017  . Thoracic aortic atherosclerosis ([place]) 03/17/2017  . Type II diabetes mellitus ([place])     Past Surgical History:  Procedure Laterality Date  . APPENDECTOMY    . FRACTURE SURGERY    . HIP ARTHROPLASTY  05/20/2012   Procedure: ARTHROPLASTY BIPOLAR HIP;  Surgeon: Marybelle Killings, MD;  Location: [facility];  Service: Orthopedics;  Laterality:  Left;  Left monopolar hemiarthroplasty  . ORIF FEMUR FRACTURE Left 03/17/2017   Procedure: REMOVAL OF HEMIARTHROPLASTY, GIRDLESTONE, OPEN REDUCTION INTERNAL FIXATION OF FEMUR FRACTURE, PLATING OF FEMUR FRACTURE;  Surgeon: Marybelle Killings, MD;  Location: [facility];  Service: Orthopedics;  Laterality: Left;  . TOTAL HIP REVISION Left 06/08/2012   Procedure: TOTAL HIP REVISION;  Surgeon: Marybelle Killings, MD;  Location: [facility];  Service:  Orthopedics;  Laterality: Left;  Revision left hip hemiarthroplasty for periprosthetic fracture  . TUBAL LIGATION       reports that she quit smoking about 42 years ago. Her smoking use included cigarettes. She has a 57.00 pack-year smoking history. She has never used smokeless tobacco. She reports that she does not drink alcohol or use drugs.  No Known Allergies  Family history  Hypertension several; family members.  Prior to Admission medications   Medication Sig Start Date End Date Taking? Authorizing Provider  ALPRAZolam Duanne Moron) 0.25 MG tablet Take 0.25 mg by mouth at bedtime as needed for anxiety.   Yes [provider]  amitriptyline (ELAVIL) 25 MG tablet Take 25-50 mg daily by mouth. 03/10/17  Yes [provider]  aspirin 81 MG chewable tablet Chew 81 mg by mouth daily.   Yes [provider]  dorzolamide-timolol (COSOPT) 22.3-6.8 MG/ML ophthalmic solution Place 1 drop 2 (two) times daily into both eyes. 03/10/17  Yes [provider]  ferrous sulfate 325 (65 FE) MG tablet Take 325 mg 2 (two) times daily by mouth. 02/02/17  Yes [provider]  KLOR-CON M20 20 MEQ tablet Take 20 mEq daily by mouth. with food 03/09/17  Yes [provider]  latanoprost (XALATAN) 0.005 % ophthalmic solution Place 1 drop at bedtime into both eyes. 03/09/17  Yes [provider]  lisinopril (PRINIVIL,ZESTRIL) 20 MG tablet Take 20 mg by mouth daily.   Yes [provider]  QUEtiapine (SEROQUEL) 25 MG tablet Take 25-50 mg at bedtime by mouth. 02/18/17  Yes [provider]  acetaminophen (TYLENOL) 325 MG tablet Take 2 tablets (650 mg total) by mouth every 6 (six) hours as needed for mild pain. 03/21/17   Samuella Cota, MD  nitrofurantoin (MACRODANTIN) 100 MG capsule 1 CAPSULE WITH FOOD OR MILK TWICE A DAY ORALLY 7 DAYS 08/04/17   [provider]  pantoprazole (PROTONIX) 40 MG tablet Take 40 mg daily before breakfast by  mouth. 02/24/17   [provider]  sulfamethoxazole-trimethoprim (BACTRIM DS,SEPTRA DS) 800-160 MG tablet Take 1 tablet by mouth 2 (two) times daily. for 10 days 08/19/17   [provider]    Physical Exam: Vitals:   09/02/17 1653 09/02/17 1657 09/02/17 1911  BP: (!) 200/152  (!) 170/82  Pulse: (!) 117    Resp: 18  18  Temp: (!) 97.4 F (36.3 C)  98.3 F (36.8 C)  TempSrc: Oral  Axillary  Weight:  48.5 kg (107 lb)   Height:  5\' 6"  (1.676 m)     Constitutional: NAD, calm, comfortable Eyes: PERRL, lids and conjunctivae normal ENMT: Mucous membranes are mildly dry. Posterior pharynx clear of any exudate or lesions. Neck: normal, supple, no masses, no thyromegaly Respiratory: clear to auscultation bilaterally, no wheezing, no crackles. Normal respiratory effort. No accessory muscle use.  Cardiovascular: Regular rate and rhythm, positive 3/6 systolic ejection murmur, no rubs / gallops. No extremity edema. 2+ pedal pulses. No carotid bruits.  Abdomen: Soft, no tenderness, no masses palpated. No hepatosplenomegaly. Bowel sounds positive.  Musculoskeletal: no clubbing /  cyanosis.  Positive suprapubic catheter tubing seen along the right lower extremity.  Good ROM, no contractures. Normal muscle tone.  Skin:  Neurologic: CN 2-12 grossly intact. Sensation intact, DTR normal. Strength 5/5 in all 4.  Psychiatric: The patient is talking nonstop, which makes the exam difficult at times, particularly auscultation.  She is alert and oriented x 3 with mild difficulty, but knew who is the president, the month and year.  Labs on Admission: I have personally reviewed following labs and imaging studies  CBC: Recent Labs  Lab 09/02/17 1750  WBC 4.9  NEUTROABS 3.0  HGB 12.3  HCT 38.1  MCV 86.0  PLT 025   Basic Metabolic Panel: Recent Labs  Lab 09/02/17 1750  NA 132*  K 4.9  CL 100*  CO2 25  GLUCOSE 112*  BUN 29*  CREATININE 0.67  CALCIUM 10.2   GFR: Estimated  Creatinine Clearance: 42.2 mL/min (by C-G formula based on SCr of 0.67 mg/dL). Liver Function Tests: Recent Labs  Lab 09/02/17 1750  AST 23  ALT 17  ALKPHOS 62  BILITOT 0.5  PROT 8.9*  ALBUMIN 3.4*   No results for input(s): LIPASE, AMYLASE in the last 168 hours. No results for input(s): AMMONIA in the last 168 hours. Coagulation Profile: No results for input(s): INR, PROTIME in the last 168 hours. Cardiac Enzymes: No results for input(s): CKTOTAL, CKMB, CKMBINDEX, TROPONINI in the last 168 hours. BNP (last 3 results) No results for input(s): PROBNP in the last 8760 hours. HbA1C: No results for input(s): HGBA1C in the last 72 hours. CBG: No results for input(s): GLUCAP in the last 168 hours. Lipid Profile: No results for input(s): CHOL, HDL, LDLCALC, TRIG, CHOLHDL, LDLDIRECT in the last 72 hours. Thyroid Function Tests: No results for input(s): TSH, T4TOTAL, FREET4, T3FREE, THYROIDAB in the last 72 hours. Anemia Panel: No results for input(s): VITAMINB12, FOLATE, FERRITIN, TIBC, IRON, RETICCTPCT in the last 72 hours. Urine analysis:    Component Value Date/Time   COLORURINE YELLOW 09/02/2017 1741   APPEARANCEUR CLOUDY (A) 09/02/2017 1741   LABSPEC 1.017 09/02/2017 1741   PHURINE 5.0 09/02/2017 1741   GLUCOSEU NEGATIVE 09/02/2017 1741   HGBUR LARGE (A) 09/02/2017 1741   BILIRUBINUR NEGATIVE 09/02/2017 1741   KETONESUR NEGATIVE 09/02/2017 1741   PROTEINUR 30 (A) 09/02/2017 1741   UROBILINOGEN 0.2 06/08/2012 1148   NITRITE NEGATIVE 09/02/2017 1741   LEUKOCYTESUR LARGE (A) 09/02/2017 1741    Radiological Exams on Admission: No results found.  February 2019 echocardiogram  ------------------------------------------------------------------- LV EF: 55% -   60%  ------------------------------------------------------------------- Indications:      Chest pain 786.51.  ------------------------------------------------------------------- History:   PMH:  Thoracic  aortic atherosclerosis, Hip Fracture, Former Smoker. LBBB.  Angina pectoris.  Risk factors: Hypertension.  ------------------------------------------------------------------- Study Conclusions  - Left ventricle: The cavity size was normal. Wall thickness was   increased in a pattern of mild LVH. Systolic function was normal.   The estimated ejection fraction was in the range of 55% to 60%.   Wall motion was normal; there were no regional wall motion   abnormalities. Doppler parameters are consistent with abnormal   left ventricular relaxation (grade 1 diastolic dysfunction).   Doppler parameters are consistent with high ventricular filling   pressure. - Mitral valve: There was mild regurgitation. - Tricuspid valve: There was mild regurgitation.  EKG: Independently reviewed. Unable to obtain due to the patient's agitation.  Assessment/Plan Principal Problem:   UTI (urinary tract infection) Admit to telemetry/inpatient. Start IV fluids.  Continue ceftriaxone 1 g IVPB every 24 hours. Follow-up urine culture and sensitivity.  Active Problems:   Altered mental status Continue treatment for UTI. Anxiolytics as needed.    Tachycardia The patient is not allowing further evaluation. Will reevaluate after hydration. Continue cardiac telemetry.    Hypertension Continue lisinopril 20 mg p.o. daily. Monitor blood pressure, renal function and electrolytes. Hydralazine 5 mg every 4 hours as needed.    Hyponatremia Likely due to decreased oral intake. Continue gentle IV hydration with normal saline infusion.    GERD (gastroesophageal reflux disease) Protonix 40 mg p.o. daily.    Anxiety with depression Continue alprazolam 0.25 mg p.o. at bedtime. Continue Seroquel 25 to 50 mg p.o. at bedtime. Hold amitriptyline due to age. Doxepin or nortriptyline may have less side effects.    DVT prophylaxis: Lovenox SQ. Code Status: Full code. Family Communication: Daughter and POA  was present in the ED room. Disposition Plan: For IV antibiotic therapy for 2 to 3 days. Consults called:  Admission status: Inpatient/telemetry.   Reubin Milan MD [facility] Pager [phone].  If 7PM-7AM, please contact night-coverage [email or web address] Password [email or web address]  09/02/2017, 10:39 PM

## 2017-09-02 NOTE — ED Triage Notes (Addendum)
Pt has a chronic catheter and per family, pt has had increased confusion, weakness and odor and cloudiness to pt's urine x 2 weeks

## 2017-09-02 NOTE — ED Notes (Signed)
Pt resting in bed ,talking to family mamber

## 2017-09-02 NOTE — ED Notes (Signed)
Pt refused cardiac monitoring. 

## 2017-09-03 DIAGNOSIS — E871 Hypo-osmolality and hyponatremia: Secondary | ICD-10-CM

## 2017-09-03 DIAGNOSIS — K219 Gastro-esophageal reflux disease without esophagitis: Secondary | ICD-10-CM

## 2017-09-03 DIAGNOSIS — R4182 Altered mental status, unspecified: Secondary | ICD-10-CM

## 2017-09-03 DIAGNOSIS — R000 Tachycardia, unspecified: Secondary | ICD-10-CM

## 2017-09-03 DIAGNOSIS — I10 Essential (primary) hypertension: Secondary | ICD-10-CM

## 2017-09-03 LAB — CBC WITH DIFFERENTIAL/PLATELET
BASOS ABS: 0.1 10*3/uL (ref 0.0–0.1)
BASOS PCT: 1 %
EOS ABS: 0.1 10*3/uL (ref 0.0–0.7)
Eosinophils Relative: 4 %
HCT: 36 % (ref 36.0–46.0)
HEMOGLOBIN: 11.5 g/dL — AB (ref 12.0–15.0)
Lymphocytes Relative: 20 %
Lymphs Abs: 0.8 10*3/uL (ref 0.7–4.0)
MCH: 27.8 pg (ref 26.0–34.0)
MCHC: 31.9 g/dL (ref 30.0–36.0)
MCV: 87 fL (ref 78.0–100.0)
Monocytes Absolute: 0.7 10*3/uL (ref 0.1–1.0)
Monocytes Relative: 19 %
NEUTROS PCT: 56 %
Neutro Abs: 2.2 10*3/uL (ref 1.7–7.7)
Platelets: 304 10*3/uL (ref 150–400)
RBC: 4.14 MIL/uL (ref 3.87–5.11)
RDW: 13.5 % (ref 11.5–15.5)
WBC: 3.9 10*3/uL — ABNORMAL LOW (ref 4.0–10.5)

## 2017-09-03 LAB — BASIC METABOLIC PANEL
Anion gap: 7 (ref 5–15)
BUN: 26 mg/dL — ABNORMAL HIGH (ref 6–20)
CHLORIDE: 100 mmol/L — AB (ref 101–111)
CO2: 28 mmol/L (ref 22–32)
CREATININE: 0.66 mg/dL (ref 0.44–1.00)
Calcium: 9.9 mg/dL (ref 8.9–10.3)
GFR calc non Af Amer: >60 mL/min (ref >60)
Glucose, Bld: 95 mg/dL (ref 65–99)
Potassium: 5.1 mmol/L (ref 3.5–5.1)
SODIUM: 135 mmol/L (ref 135–145)

## 2017-09-03 MED ORDER — CEFTRIAXONE SODIUM 1 G IJ SOLR
1.0000 g | INTRAMUSCULAR | Status: DC
  Administered 2017-09-04 – 2017-09-05 (×2): 1 g via INTRAVENOUS
  Filled 2017-09-03: qty 1
  Filled 2017-09-03: qty 10
  Filled 2017-09-03: qty 1

## 2017-09-03 MED ORDER — HALOPERIDOL LACTATE 5 MG/ML IJ SOLN
3.5000 mg | Freq: Once | INTRAMUSCULAR | Status: AC
  Administered 2017-09-03: 3.5 mg via INTRAVENOUS
  Filled 2017-09-03: qty 1

## 2017-09-03 NOTE — Care Management Important Message (Signed)
Important Message  Patient Details  Name: [name] MRN: [id number] Date of Birth: October 25, 1933   Medicare Important Message Given:  Yes    Sherald Barge, RN 09/03/2017, 1:31 PM

## 2017-09-03 NOTE — Progress Notes (Signed)
PROGRESS NOTE    [name]  ZOX:[id number]  DOB: 25-Feb-1934  DOA: 09/02/2017 PCP: Medicine, [facility]   Brief Admission Hx: 84 y.o. female with medical history significant of history of iron deficiency anemia, anxiety, osteoarthritis, history of Bell's palsy, depression, history of falls with left hip dislocation and periprostatic femur fracture, GERD, hypertension, left bundle branch block, pressure injury of sacral region, thoracic aortic atherosclerosis, type 2 diabetes who is being brought to the emergency department by her daughter due to progressively worse confusion, weakness, malodorous and cloudy urine for the past 2 weeks associated with decreased appetite and low-grade temperature.  MDM/Assessment & Plan:   1. UTI - continue current treatment with IV ceftriaxone. Follow cultures.  2. Altered mental status - improving with supportive care.   3. Sinus Tachycardia - improving with supportive care and treating infection.   4. Essential Hypertension - stable. Follow.  5. Hyponatremia - resolved with IVF hydration.   6. GERD - stable.   7. Anxiety with depression - she was continued on alprazolam and seroquel, holding amitriptyline.   DVT prophylaxis: Lovenox SQ. Code Status: Full code. Family Communication: Daughter and POA was present in the ED room. Disposition Plan: For IV antibiotic therapy for 2 to 3 days. Consults called:  Admission status: Inpatient/telemetry.  Subjective: Pt is starting to show improvement and less symptoms, more alert this morning.   Objective: Vitals:   09/02/17 1653 09/02/17 1657 09/02/17 1911  BP: (!) 200/152  (!) 170/82  Pulse: (!) 117    Resp: 18  18  Temp: (!) 97.4 F (36.3 C)  98.3 F (36.8 C)  TempSrc: Oral  Axillary  Weight:  48.5 kg (107 lb)   Height:  5\' 6"  (1.676 m)     Intake/Output Summary (Last 24 hours) at 09/03/2017 8119 Last data filed at 09/02/2017 2235 Gross per 24 hour  Intake -  Output 200 ml  Net -200 ml     Filed Weights   09/02/17 1657  Weight: 48.5 kg (107 lb)     REVIEW OF SYSTEMS  UTO due to confusion  Exam:  General exam: awake, alert, NAD, dry Mucus membranes.  Respiratory system: Clear. No increased work of breathing. Cardiovascular system: S1 & S2 heard, RRR. No JVD, murmurs, gallops, clicks or pedal edema. Gastrointestinal system: Abdomen is nondistended, soft and nontender. Normal bowel sounds heard. Central nervous system: Alert and oriented. No focal neurological deficits. Extremities: no CCE.  Data Reviewed: Basic Metabolic Panel: Recent Labs  Lab 09/02/17 1750  NA 132*  K 4.9  CL 100*  CO2 25  GLUCOSE 112*  BUN 29*  CREATININE 0.67  CALCIUM 10.2   Liver Function Tests: Recent Labs  Lab 09/02/17 1750  AST 23  ALT 17  ALKPHOS 62  BILITOT 0.5  PROT 8.9*  ALBUMIN 3.4*   No results for input(s): LIPASE, AMYLASE in the last 168 hours. No results for input(s): AMMONIA in the last 168 hours. CBC: Recent Labs  Lab 09/02/17 1750  WBC 4.9  NEUTROABS 3.0  HGB 12.3  HCT 38.1  MCV 86.0  PLT 374   Cardiac Enzymes: No results for input(s): CKTOTAL, CKMB, CKMBINDEX, TROPONINI in the last 168 hours. CBG (last 3)  No results for input(s): GLUCAP in the last 72 hours. No results found for this or any previous visit (from the past 240 hour(s)).   Studies: No results found.   Scheduled Meds: . aspirin  81 mg Oral Daily  . dorzolamide-timolol  1 drop  Both Eyes BID  . enoxaparin (LOVENOX) injection  40 mg Subcutaneous Q24H  . ferrous sulfate  325 mg Oral BID  . latanoprost  1 drop Both Eyes QHS  . lisinopril  20 mg Oral Daily  . pantoprazole  40 mg Oral QAC breakfast  . [START ON 09/04/2017] potassium chloride SA  20 mEq Oral Daily  . QUEtiapine  50 mg Oral QHS   Continuous Infusions: . sodium chloride    . sodium chloride      Principal Problem:   UTI (urinary tract infection) Active Problems:   Hypertension   Hyponatremia   GERD  (gastroesophageal reflux disease)   Anxiety with depression   Altered mental status   Tachycardia   Time spent:   Irwin Brakeman, MD, FAAFP [facility] Pager [phone]  If 7PM-7AM, please contact night-coverage [email or web address] Password [email or web address] 09/03/2017, 6:26 AM    LOS: 1 day

## 2017-09-04 DIAGNOSIS — F418 Other specified anxiety disorders: Secondary | ICD-10-CM

## 2017-09-04 DIAGNOSIS — G9340 Encephalopathy, unspecified: Secondary | ICD-10-CM

## 2017-09-04 LAB — MRSA PCR SCREENING: MRSA BY PCR: POSITIVE — AB

## 2017-09-04 MED ORDER — CHLORHEXIDINE GLUCONATE CLOTH 2 % EX PADS
6.0000 | MEDICATED_PAD | Freq: Every day | CUTANEOUS | Status: DC
  Administered 2017-09-04 – 2017-09-05 (×2): 6 via TOPICAL

## 2017-09-04 MED ORDER — MUPIROCIN 2 % EX OINT
1.0000 "application " | TOPICAL_OINTMENT | Freq: Two times a day (BID) | CUTANEOUS | Status: DC
  Administered 2017-09-04 – 2017-09-05 (×3): 1 via NASAL
  Filled 2017-09-04: qty 22

## 2017-09-04 MED ORDER — OXYBUTYNIN CHLORIDE 5 MG PO TABS
5.0000 mg | ORAL_TABLET | Freq: Two times a day (BID) | ORAL | Status: DC
  Administered 2017-09-04 – 2017-09-05 (×2): 5 mg via ORAL
  Filled 2017-09-04 (×2): qty 1

## 2017-09-04 NOTE — Progress Notes (Signed)
PROGRESS NOTE    [name]  OAC:[id number]  DOB: 07-17-1933  DOA: 09/02/2017 PCP: Medicine, [facility]   Brief Admission Hx: 84 y.o. female with medical history significant of history of iron deficiency anemia, anxiety, osteoarthritis, history of Bell's palsy, depression, history of falls with left hip dislocation and periprostatic femur fracture, GERD, hypertension, left bundle branch block, pressure injury of sacral region, thoracic aortic atherosclerosis, type 2 diabetes who is being brought to the emergency department by her daughter due to progressively worse confusion, weakness, malodorous and cloudy urine for the past 2 weeks associated with decreased appetite and low-grade temperature.  MDM/Assessment & Plan:   1. UTI related to chronic indwelling catheter- continue current treatment with IV ceftriaxone.  Urine culture positive for E. coli and enterococcus.  Follow final cultures 2. Altered mental status - improving with supportive care.   3. Sinus Tachycardia - improving with supportive care and treating infection.   4. Essential Hypertension - stable. Follow.  5. Hyponatremia - resolved with IVF hydration.   6. GERD - stable.   7. Anxiety with depression - she was continued on alprazolam and seroquel, holding amitriptyline.   DVT prophylaxis: Lovenox SQ. Code Status: Full code. Family Communication:  Discussed with daughter Thayer Headings over the phone Disposition Plan:  Discharge home once final urine culture results available Consults called:  Admission status: Inpatient/telemetry.  Subjective: Patient still has some confusion.  Denies any shortness of breath or chest pain.  Objective: Vitals:   09/03/17 1401 09/03/17 2231 09/04/17 0524 09/04/17 1343  BP: 115/74 (!) 95/51 135/65 (!) 121/59  Pulse: 79 76 78 92  Resp: 20 16  18   Temp: 98.6 F (37 C) 97.7 F (36.5 C) 98 F (36.7 C) 98.1 F (36.7 C)  TempSrc: Oral Axillary Oral Oral  SpO2: 96% 96% 96% 94%    Weight:      Height:        Intake/Output Summary (Last 24 hours) at 09/04/2017 1819 Last data filed at 09/04/2017 1200 Gross per 24 hour  Intake 1994.17 ml  Output 2150 ml  Net -155.83 ml   Filed Weights   09/02/17 1657 09/03/17 0647  Weight: 48.5 kg (107 lb) 48.5 kg (106 lb 14.8 oz)     REVIEW OF SYSTEMS  UTO due to confusion  Exam:  General exam: Alert, awake, no distress Respiratory system: Clear to auscultation. Respiratory effort normal. Cardiovascular system:RRR. No murmurs, rubs, gallops. Gastrointestinal system: Abdomen is nondistended, soft and nontender. No organomegaly or masses felt. Normal bowel sounds heard. Central nervous system:  No focal neurological deficits. Extremities: No C/C/E, +pedal pulses Skin: No rashes, lesions or ulcers Psychiatry: Still has some confusion, pleasant   Data Reviewed: Basic Metabolic Panel: Recent Labs  Lab 09/02/17 1750 09/03/17 0710  NA 132* 135  K 4.9 5.1  CL 100* 100*  CO2 25 28  GLUCOSE 112* 95  BUN 29* 26*  CREATININE 0.67 0.66  CALCIUM 10.2 9.9   Liver Function Tests: Recent Labs  Lab 09/02/17 1750  AST 23  ALT 17  ALKPHOS 62  BILITOT 0.5  PROT 8.9*  ALBUMIN 3.4*   No results for input(s): LIPASE, AMYLASE in the last 168 hours. No results for input(s): AMMONIA in the last 168 hours. CBC: Recent Labs  Lab 09/02/17 1750 09/03/17 0710  WBC 4.9 3.9*  NEUTROABS 3.0 2.2  HGB 12.3 11.5*  HCT 38.1 36.0  MCV 86.0 87.0  PLT 374 304   Cardiac Enzymes: No results for input(s): CKTOTAL,  CKMB, CKMBINDEX, TROPONINI in the last 168 hours. CBG (last 3)  No results for input(s): GLUCAP in the last 72 hours. Recent Results (from the past 240 hour(s))  Urine culture     Status: Abnormal (Preliminary result)   Collection Time: 09/02/17  5:41 PM  Result Value Ref Range Status   Specimen Description   Final    URINE, CLEAN CATCH Performed at [facility], [address] [place], [place] [address]     Special Requests   Final    NONE Performed at [facility], [address], [place], [place] [address]    Culture (A)  Final    >=100,000 COLONIES/mL ENTEROCOCCUS FAECALIS >=100,000 COLONIES/mL KLEBSIELLA PNEUMONIAE SUSCEPTIBILITIES TO FOLLOW Performed at [facility] [address], [place], [place] [address]    Report Status PENDING  Incomplete  MRSA PCR Screening     Status: Abnormal   Collection Time: 09/03/17 11:06 PM  Result Value Ref Range Status   MRSA by PCR POSITIVE (A) NEGATIVE Final    Comment:        The GeneXpert MRSA Assay (FDA approved for NASAL specimens only), is one component of a comprehensive MRSA colonization surveillance program. It is not intended to diagnose MRSA infection nor to guide or monitor treatment for MRSA infections. RESULT CALLED TO, READ BACK BY AND VERIFIED WITH: MARTIN,J @ 0147 ON 09/04/17 BY JUW Performed at [facility], [address], [place], [place] [address]      Studies: No results found.   Scheduled Meds: . aspirin  81 mg Oral Daily  . Chlorhexidine Gluconate Cloth  6 each Topical Q0600  . dorzolamide-timolol  1 drop Both Eyes BID  . enoxaparin (LOVENOX) injection  40 mg Subcutaneous Q24H  . ferrous sulfate  325 mg Oral BID  . latanoprost  1 drop Both Eyes QHS  . lisinopril  20 mg Oral Daily  . mupirocin ointment  1 application Nasal BID  . pantoprazole  40 mg Oral QAC breakfast  . potassium chloride SA  20 mEq Oral Daily  . QUEtiapine  50 mg Oral QHS   Continuous Infusions: . sodium chloride 50 mL/hr at 09/04/17 0400  . cefTRIAXone (ROCEPHIN)  IV Stopped (09/04/17 0541)  . sodium chloride      Principal Problem:   UTI (urinary tract infection) Active Problems:   Hypertension   Hyponatremia   GERD (gastroesophageal reflux disease)   Anxiety with depression   Altered mental status   Tachycardia   Time spent: 22mins  Kathie Dike, MD [facility] Pager [phone]  If 7PM-7AM, please  contact night-coverage [email or web address] Password [email or web address] 09/04/2017, 6:19 PM    LOS: 2 days

## 2017-09-05 LAB — URINE CULTURE

## 2017-09-05 MED ORDER — LEVOFLOXACIN 500 MG PO TABS: 500.0000 mg | ORAL_TABLET | Freq: Every day | ORAL | 0 refills | Status: DC

## 2017-09-05 MED ORDER — LEVOFLOXACIN 500 MG PO TABS
500.0000 mg | ORAL_TABLET | Freq: Every day | ORAL | Status: DC
  Administered 2017-09-05: 500 mg via ORAL
  Filled 2017-09-05: qty 1

## 2017-09-05 NOTE — Progress Notes (Signed)
Discharge instructions read to patient and her family.  All verbalized understanding of instructions.Discharged to home with family 

## 2017-09-05 NOTE — Discharge Summary (Signed)
Physician Discharge Summary  [name] KGM:[id number] DOB: 04-18-1934 DOA: 09/02/2017  PCP: Medicine, [facility]  Admit date: 09/02/2017 Discharge date: 09/05/2017  Admitted From: Home Disposition: Home  Recommendations for Outpatient Follow-up:  1. Follow up with PCP in 1-2 weeks 2. Please obtain BMP/CBC in one week 3. Follow-up with urology for chronic Foley  Home Health: Equipment/Devices:  Discharge Condition: Stable CODE STATUS: Full code Diet recommendation: Heart Healthy  Brief/Interim Summary: 84 year old female with medical history significant for iron deficiency anemia, anxiety, severe osteoarthritis, history of falls with left hip dislocation and periprosthetic femur fracture, who is essentially wheelchair-bound and has a chronic indwelling Foley catheter due to difficulty getting to commode.  Patient was brought to the hospital with progressively worsening confusion, weakness and malodorous urine.  She is found to have urinary tract infection positive for E. coli and enterococcus.  She was treated with intravenous antibiotics and subsequently de-escalated to oral levofloxacin which will cover both organisms.  Mental status improved with IV fluids and antibiotics.  Heart rate is also stabilized.  Blood pressure has been stable.  She will be discharged home on oral antibiotics.  I have recommended to the patient's daughter since she does not have a clear urologic reasons such as urinary retention for her urinary catheter, her catheter should be removed and other options considered including diapers and bedpan to collect her urine.  She wishes to discuss this further with a urologist and will continue the catheter for now.  Discharge Diagnoses:  Principal Problem:   UTI (urinary tract infection) Active Problems:   Hypertension   Hyponatremia   GERD (gastroesophageal reflux disease)   Anxiety with depression   Altered mental status   Tachycardia    Discharge  Instructions  Discharge Instructions    Diet - low sodium heart healthy   Complete by:  As directed    Increase activity slowly   Complete by:  As directed      Allergies as of 09/05/2017   No Known Allergies     Medication List    STOP taking these medications   nitrofurantoin 100 MG capsule Commonly known as:  MACRODANTIN   sulfamethoxazole-trimethoprim 800-160 MG tablet Commonly known as:  BACTRIM DS,SEPTRA DS     TAKE these medications   acetaminophen 325 MG tablet Commonly known as:  TYLENOL Take 2 tablets (650 mg total) by mouth every 6 (six) hours as needed for mild pain.   ALPRAZolam 0.25 MG tablet Commonly known as:  XANAX Take 0.25 mg by mouth at bedtime as needed for anxiety.   amitriptyline 25 MG tablet Commonly known as:  ELAVIL Take 25-50 mg daily by mouth.   aspirin 81 MG chewable tablet Chew 81 mg by mouth daily.   dorzolamide-timolol 22.3-6.8 MG/ML ophthalmic solution Commonly known as:  COSOPT Place 1 drop 2 (two) times daily into both eyes.   ferrous sulfate 325 (65 FE) MG tablet Take 325 mg 2 (two) times daily by mouth.   KLOR-CON M20 20 MEQ tablet Generic drug:  potassium chloride SA Take 20 mEq daily by mouth. with food   latanoprost 0.005 % ophthalmic solution Commonly known as:  XALATAN Place 1 drop at bedtime into both eyes.   levofloxacin 500 MG tablet Commonly known as:  LEVAQUIN Take 1 tablet (500 mg total) by mouth daily. Start taking on:  09/06/2017   lisinopril 20 MG tablet Commonly known as:  PRINIVIL,ZESTRIL Take 20 mg by mouth daily.   pantoprazole 40 MG tablet Commonly known  as:  PROTONIX Take 40 mg daily before breakfast by mouth.   QUEtiapine 25 MG tablet Commonly known as:  SEROQUEL Take 25-50 mg at bedtime by mouth.       No Known Allergies  Consultations:     Procedures/Studies:  No results found.    Subjective: Patient is feeling well today.  Denies any shortness of breath, chest  pain  Discharge Exam: Vitals:   09/05/17 0930 09/05/17 1320  BP: (!) 148/71 130/62  Pulse:  71  Resp:  16  Temp:  97.7 F (36.5 C)  SpO2:  95%   Vitals:   09/04/17 2301 09/05/17 0440 09/05/17 0930 09/05/17 1320  BP: 125/62 118/67 (!) 148/71 130/62  Pulse: 81 67  71  Resp: 18   16  Temp: 98.6 F (37 C) 98.4 F (36.9 C)  97.7 F (36.5 C)  TempSrc: Oral Oral  Oral  SpO2: 98% 95%  95%  Weight:      Height:        General: Pt is alert, awake, not in acute distress Cardiovascular: RRR, S1/S2 +, no rubs, no gallops Respiratory: CTA bilaterally, no wheezing, no rhonchi Abdominal: Soft, NT, ND, bowel sounds + Extremities: no edema, no cyanosis    The results of significant diagnostics from this hospitalization (including imaging, microbiology, ancillary and laboratory) are listed below for reference.     Microbiology: Recent Results (from the past 240 hour(s))  Urine culture     Status: Abnormal   Collection Time: 09/02/17  5:41 PM  Result Value Ref Range Status   Specimen Description   Final    URINE, CLEAN CATCH Performed at [facility], [address], [place], [place] [address]    Special Requests   Final    NONE Performed at [facility], [address], [place],  [address]    Culture (A)  Final    >=100,000 COLONIES/mL ENTEROCOCCUS FAECALIS >=100,000 COLONIES/mL KLEBSIELLA PNEUMONIAE    Report Status 09/05/2017 FINAL  Final   Organism ID, Bacteria ENTEROCOCCUS FAECALIS (A)  Final   Organism ID, Bacteria KLEBSIELLA PNEUMONIAE (A)  Final      Susceptibility   Enterococcus faecalis - MIC*    AMPICILLIN <=2 SENSITIVE Sensitive     LEVOFLOXACIN 1 SENSITIVE Sensitive     NITROFURANTOIN <=16 SENSITIVE Sensitive     VANCOMYCIN 1 SENSITIVE Sensitive     * >=100,000 COLONIES/mL ENTEROCOCCUS FAECALIS   Klebsiella pneumoniae - MIC*    AMPICILLIN >=32 RESISTANT Resistant     CEFAZOLIN <=4 SENSITIVE Sensitive     CEFTRIAXONE <=1 SENSITIVE Sensitive      CIPROFLOXACIN <=0.25 SENSITIVE Sensitive     GENTAMICIN <=1 SENSITIVE Sensitive     IMIPENEM <=0.25 SENSITIVE Sensitive     NITROFURANTOIN 64 INTERMEDIATE Intermediate     TRIMETH/SULFA <=20 SENSITIVE Sensitive     AMPICILLIN/SULBACTAM 4 SENSITIVE Sensitive     PIP/TAZO <=4 SENSITIVE Sensitive     Extended ESBL NEGATIVE Sensitive     * >=100,000 COLONIES/mL KLEBSIELLA PNEUMONIAE  MRSA PCR Screening     Status: Abnormal   Collection Time: 09/03/17 11:06 PM  Result Value Ref Range Status   MRSA by PCR POSITIVE (A) NEGATIVE Final    Comment:        The GeneXpert MRSA Assay (FDA approved for NASAL specimens only), is one component of a comprehensive MRSA colonization surveillance program. It is not intended to diagnose MRSA infection nor to guide or monitor treatment for MRSA infections. RESULT CALLED TO,  READ BACK BY AND VERIFIED WITH: MARTIN,J @ [place] ON 09/04/17 BY JUW Performed at [facility], [address] [place], [place] [address]      Labs: BNP (last 3 results) No results for input(s): BNP in the last 8760 hours. Basic Metabolic Panel: Recent Labs  Lab 09/02/17 1750 09/03/17 0710  NA 132* 135  K 4.9 5.1  CL 100* 100*  CO2 25 28  GLUCOSE 112* 95  BUN 29* 26*  CREATININE 0.67 0.66  CALCIUM 10.2 9.9   Liver Function Tests: Recent Labs  Lab 09/02/17 1750  AST 23  ALT 17  ALKPHOS 62  BILITOT 0.5  PROT 8.9*  ALBUMIN 3.4*   No results for input(s): LIPASE, AMYLASE in the last 168 hours. No results for input(s): AMMONIA in the last 168 hours. CBC: Recent Labs  Lab 09/02/17 1750 09/03/17 0710  WBC 4.9 3.9*  NEUTROABS 3.0 2.2  HGB 12.3 11.5*  HCT 38.1 36.0  MCV 86.0 87.0  PLT 374 304   Cardiac Enzymes: No results for input(s): CKTOTAL, CKMB, CKMBINDEX, TROPONINI in the last 168 hours. BNP: Invalid input(s): POCBNP CBG: No results for input(s): GLUCAP in the last 168 hours. D-Dimer No results for input(s): DDIMER in the last 72 hours. Hgb  A1c No results for input(s): HGBA1C in the last 72 hours. Lipid Profile No results for input(s): CHOL, HDL, LDLCALC, TRIG, CHOLHDL, LDLDIRECT in the last 72 hours. Thyroid function studies No results for input(s): TSH, T4TOTAL, T3FREE, THYROIDAB in the last 72 hours.  Invalid input(s): FREET3 Anemia work up No results for input(s): VITAMINB12, FOLATE, FERRITIN, TIBC, IRON, RETICCTPCT in the last 72 hours. Urinalysis    Component Value Date/Time   COLORURINE YELLOW 09/02/2017 1741   APPEARANCEUR CLOUDY (A) 09/02/2017 1741   LABSPEC 1.017 09/02/2017 1741   PHURINE 5.0 09/02/2017 1741   GLUCOSEU NEGATIVE 09/02/2017 1741   HGBUR LARGE (A) 09/02/2017 1741   BILIRUBINUR NEGATIVE 09/02/2017 1741   KETONESUR NEGATIVE 09/02/2017 1741   PROTEINUR 30 (A) 09/02/2017 1741   UROBILINOGEN 0.2 06/08/2012 1148   NITRITE NEGATIVE 09/02/2017 1741   LEUKOCYTESUR LARGE (A) 09/02/2017 1741   Sepsis Labs Invalid input(s): PROCALCITONIN,  WBC,  LACTICIDVEN Microbiology Recent Results (from the past 240 hour(s))  Urine culture     Status: Abnormal   Collection Time: 09/02/17  5:41 PM  Result Value Ref Range Status   Specimen Description   Final    URINE, CLEAN CATCH Performed at [facility], [address] [place], [place] [address]    Special Requests   Final    NONE Performed at [facility], [address], [place], [place] [address]    Culture (A)  Final    >=100,000 COLONIES/mL ENTEROCOCCUS FAECALIS >=100,000 COLONIES/mL KLEBSIELLA PNEUMONIAE    Report Status 09/05/2017 FINAL  Final   Organism ID, Bacteria ENTEROCOCCUS FAECALIS (A)  Final   Organism ID, Bacteria KLEBSIELLA PNEUMONIAE (A)  Final      Susceptibility   Enterococcus faecalis - MIC*    AMPICILLIN <=2 SENSITIVE Sensitive     LEVOFLOXACIN 1 SENSITIVE Sensitive     NITROFURANTOIN <=16 SENSITIVE Sensitive     VANCOMYCIN 1 SENSITIVE Sensitive     * >=100,000 COLONIES/mL ENTEROCOCCUS FAECALIS   Klebsiella pneumoniae - MIC*     AMPICILLIN >=32 RESISTANT Resistant     CEFAZOLIN <=4 SENSITIVE Sensitive     CEFTRIAXONE <=1 SENSITIVE Sensitive     CIPROFLOXACIN <=0.25 SENSITIVE Sensitive     GENTAMICIN <=1 SENSITIVE Sensitive  IMIPENEM <=0.25 SENSITIVE Sensitive     NITROFURANTOIN 64 INTERMEDIATE Intermediate     TRIMETH/SULFA <=20 SENSITIVE Sensitive     AMPICILLIN/SULBACTAM 4 SENSITIVE Sensitive     PIP/TAZO <=4 SENSITIVE Sensitive     Extended ESBL NEGATIVE Sensitive     * >=100,000 COLONIES/mL KLEBSIELLA PNEUMONIAE  MRSA PCR Screening     Status: Abnormal   Collection Time: 09/03/17 11:06 PM  Result Value Ref Range Status   MRSA by PCR POSITIVE (A) NEGATIVE Final    Comment:        The GeneXpert MRSA Assay (FDA approved for NASAL specimens only), is one component of a comprehensive MRSA colonization surveillance program. It is not intended to diagnose MRSA infection nor to guide or monitor treatment for MRSA infections. RESULT CALLED TO, READ BACK BY AND VERIFIED WITH: MARTIN,J @ 0147 ON 09/04/17 BY JUW Performed at [facility], [address] [place],  [address]      Time coordinating discharge: 27mins  SIGNED:   Kathie Dike, MD  [facility] 09/05/2017, 5:39 PM Pager   If 7PM-7AM, please contact night-coverage [email or web address] Password [email or web address]

## 2017-10-07 ENCOUNTER — Other Ambulatory Visit: Payer: Self-pay

## 2017-10-07 ENCOUNTER — Encounter (HOSPITAL_COMMUNITY): Payer: Self-pay | Admitting: Emergency Medicine

## 2017-10-07 ENCOUNTER — Emergency Department (HOSPITAL_COMMUNITY): Payer: Medicare Other

## 2017-10-07 ENCOUNTER — Emergency Department (HOSPITAL_COMMUNITY)
Admission: EM | Admit: 2017-10-07 | Discharge: 2017-10-07 | Disposition: A | Discharge status: Home / Self Care | Payer: Medicare Other | Attending: Emergency Medicine | Admitting: Emergency Medicine

## 2017-10-07 DIAGNOSIS — N390 Urinary tract infection, site not specified: Secondary | ICD-10-CM | POA: Insufficient documentation

## 2017-10-07 DIAGNOSIS — Z79899 Other long term (current) drug therapy: Secondary | ICD-10-CM | POA: Diagnosis not present

## 2017-10-07 DIAGNOSIS — Z87891 Personal history of nicotine dependence: Secondary | ICD-10-CM | POA: Diagnosis not present

## 2017-10-07 DIAGNOSIS — I10 Essential (primary) hypertension: Secondary | ICD-10-CM | POA: Insufficient documentation

## 2017-10-07 DIAGNOSIS — Z96642 Presence of left artificial hip joint: Secondary | ICD-10-CM | POA: Diagnosis not present

## 2017-10-07 DIAGNOSIS — R300 Dysuria: Secondary | ICD-10-CM | POA: Diagnosis present

## 2017-10-07 DIAGNOSIS — Z7982 Long term (current) use of aspirin: Secondary | ICD-10-CM | POA: Insufficient documentation

## 2017-10-07 DIAGNOSIS — K5900 Constipation, unspecified: Secondary | ICD-10-CM | POA: Diagnosis not present

## 2017-10-07 DIAGNOSIS — E119 Type 2 diabetes mellitus without complications: Secondary | ICD-10-CM | POA: Insufficient documentation

## 2017-10-07 LAB — COMPREHENSIVE METABOLIC PANEL
ALT: 10 U/L — ABNORMAL LOW (ref 14–54)
AST: 21 U/L (ref 15–41)
Albumin: 3.1 g/dL — ABNORMAL LOW (ref 3.5–5.0)
Alkaline Phosphatase: 67 U/L (ref 38–126)
Anion gap: 8 (ref 5–15)
BUN: 8 mg/dL (ref 6–20)
CHLORIDE: 100 mmol/L — AB (ref 101–111)
CO2: 24 mmol/L (ref 22–32)
Calcium: 10 mg/dL (ref 8.9–10.3)
Creatinine, Ser: 0.43 mg/dL — ABNORMAL LOW (ref 0.44–1.00)
GFR calc Af Amer: >60 mL/min (ref >60)
Glucose, Bld: 89 mg/dL (ref 65–99)
Potassium: 4.5 mmol/L (ref 3.5–5.1)
SODIUM: 132 mmol/L — AB (ref 135–145)
Total Bilirubin: 0.6 mg/dL (ref 0.3–1.2)
Total Protein: 8 g/dL (ref 6.5–8.1)

## 2017-10-07 LAB — URINALYSIS, ROUTINE W REFLEX MICROSCOPIC
BILIRUBIN URINE: NEGATIVE
GLUCOSE, UA: NEGATIVE mg/dL
Ketones, ur: NEGATIVE mg/dL
NITRITE: NEGATIVE
PROTEIN: NEGATIVE mg/dL
Specific Gravity, Urine: 1.003 — ABNORMAL LOW (ref 1.005–1.030)
pH: 7 (ref 5.0–8.0)

## 2017-10-07 LAB — CBC WITH DIFFERENTIAL/PLATELET
BASOS ABS: 0 10*3/uL (ref 0.0–0.1)
Basophils Relative: 1 %
EOS ABS: 0.2 10*3/uL (ref 0.0–0.7)
EOS PCT: 7 %
HCT: 37.2 % (ref 36.0–46.0)
Hemoglobin: 12.1 g/dL (ref 12.0–15.0)
LYMPHS PCT: 27 %
Lymphs Abs: 0.9 10*3/uL (ref 0.7–4.0)
MCH: 28.1 pg (ref 26.0–34.0)
MCHC: 32.5 g/dL (ref 30.0–36.0)
MCV: 86.5 fL (ref 78.0–100.0)
MONO ABS: 0.5 10*3/uL (ref 0.1–1.0)
Monocytes Relative: 16 %
Neutro Abs: 1.6 10*3/uL — ABNORMAL LOW (ref 1.7–7.7)
Neutrophils Relative %: 49 %
PLATELETS: 262 10*3/uL (ref 150–400)
RBC: 4.3 MIL/uL (ref 3.87–5.11)
RDW: 13 % (ref 11.5–15.5)
WBC: 3.2 10*3/uL — AB (ref 4.0–10.5)

## 2017-10-07 LAB — LIPASE, BLOOD: LIPASE: 40 U/L (ref 11–51)

## 2017-10-07 MED ORDER — LEVOFLOXACIN 500 MG PO TABS
500.0000 mg | ORAL_TABLET | Freq: Once | ORAL | Status: AC
  Administered 2017-10-07: 500 mg via ORAL
  Filled 2017-10-07: qty 1

## 2017-10-07 MED ORDER — POLYETHYLENE GLYCOL 3350 17 G PO PACK: 17.0000 g | PACK | Freq: Every day | ORAL | 0 refills | Status: DC

## 2017-10-07 MED ORDER — LEVOFLOXACIN 500 MG PO TABS: 500.0000 mg | ORAL_TABLET | Freq: Every day | ORAL | 0 refills | Status: DC

## 2017-10-07 NOTE — ED Provider Notes (Signed)
[facility] EMERGENCY DEPARTMENT Provider Note   CSN: [id number] Arrival date & time: 10/07/17  1502     History   Chief Complaint Chief Complaint  Patient presents with  . Dysuria    HPI [name] is a 84 y.o. female.  HPI Patient with indwelling Foley catheter for the past 7 months.  States she has it exchanged every month.  Has been a month since the urinary catheter was changed.  Has had cloudy foul-smelling urine for the past few days.  Patient complains of mild suprapubic discomfort.  No fever or chills.  Has a history of constipation and takes over-the-counter Colace for this. Past Medical History:  Diagnosis Date  . Anemia    takes Ferrous Gluconate daily  . Anxiety   . Arthritis    "hands, feet, bad arthritis all over" (03/15/2017)  . Bell's palsy   . Depression   . Fall 03/15/2017   resulting in left hip dislocation and periprostatic femur fracture./notes 03/15/2017  . Falls frequently    "couple times here recently" (03/15/2017)  . GERD (gastroesophageal reflux disease)   . Heart murmur   . Hypertension    takes Lisinopril and Amlodipine daily  . Left bundle branch block   . Periprosthetic fracture of femur following total replacement of hip    Left  . Pressure injury of sacral region, stage 3 ([place]) 03/21/2017  . Thoracic aortic atherosclerosis ([place]) 03/17/2017  . Type II diabetes mellitus [facility])     Patient Active Problem List   Diagnosis Date Noted  . UTI (urinary tract infection) 09/02/2017  . GERD (gastroesophageal reflux disease) 09/02/2017  . Anxiety with depression 09/02/2017  . Altered mental status 09/02/2017  . Tachycardia 09/02/2017  . Goals of care, counseling/discussion   . Palliative care by specialist   . Chest pain 06/20/2017  . HCAP (healthcare-associated pneumonia) 06/20/2017  . Pressure injury of sacral region, stage 3 ([place]) 03/21/2017  . Dislocated hip, left, initial encounter ([place]) 03/19/2017  . Thoracic aortic  atherosclerosis ([place]) 03/17/2017  . Pressure injury of skin 03/17/2017  . Femur fracture, left ([place]) 06/09/2012  . Acute blood loss anemia 05/24/2012  . Hyponatremia 05/20/2012  . Anemia, chronic disease 05/20/2012  . Left bundle branch block 05/20/2012  . Hip fracture ([place]) 05/19/2012  . Hypertension 05/19/2012    Past Surgical History:  Procedure Laterality Date  . APPENDECTOMY    . FRACTURE SURGERY    . HIP ARTHROPLASTY  05/20/2012   Procedure: ARTHROPLASTY BIPOLAR HIP;  Surgeon: Marybelle Killings, MD;  Location: [facility];  Service: Orthopedics;  Laterality: Left;  Left monopolar hemiarthroplasty  . ORIF FEMUR FRACTURE Left 03/17/2017   Procedure: REMOVAL OF HEMIARTHROPLASTY, GIRDLESTONE, OPEN REDUCTION INTERNAL FIXATION OF FEMUR FRACTURE, PLATING OF FEMUR FRACTURE;  Surgeon: Marybelle Killings, MD;  Location: [facility];  Service: Orthopedics;  Laterality: Left;  . TOTAL HIP REVISION Left 06/08/2012   Procedure: TOTAL HIP REVISION;  Surgeon: Marybelle Killings, MD;  Location: [facility];  Service: Orthopedics;  Laterality: Left;  Revision left hip hemiarthroplasty for periprosthetic fracture  . TUBAL LIGATION       OB History   None      Home Medications    Prior to Admission medications   Medication Sig Start Date End Date Taking? Authorizing Provider  acetaminophen (TYLENOL) 325 MG tablet Take 2 tablets (650 mg total) by mouth every 6 (six) hours as needed for mild pain. 03/21/17   Samuella Cota, MD  ALPRAZolam Duanne Moron)  0.25 MG tablet Take 0.25 mg by mouth at bedtime as needed for anxiety.    [provider]  amitriptyline (ELAVIL) 25 MG tablet Take 25-50 mg daily by mouth. 03/10/17   [provider]  aspirin 81 MG chewable tablet Chew 81 mg by mouth daily.    [provider]  dorzolamide-timolol (COSOPT) 22.3-6.8 MG/ML ophthalmic solution Place 1 drop 2 (two) times daily into both eyes. 03/10/17   [provider]  ferrous sulfate 325 (65 FE) MG tablet Take 325  mg 2 (two) times daily by mouth. 02/02/17   [provider]  KLOR-CON M20 20 MEQ tablet Take 20 mEq daily by mouth. with food 03/09/17   [provider]  latanoprost (XALATAN) 0.005 % ophthalmic solution Place 1 drop at bedtime into both eyes. 03/09/17   [provider]  levofloxacin (LEVAQUIN) 500 MG tablet Take 1 tablet (500 mg total) by mouth daily. 10/08/17   Julianne Rice, MD  lisinopril (PRINIVIL,ZESTRIL) 20 MG tablet Take 20 mg by mouth daily.    [provider]  pantoprazole (PROTONIX) 40 MG tablet Take 40 mg daily before breakfast by mouth. 02/24/17   [provider]  polyethylene glycol (MIRALAX / GLYCOLAX) packet Take 17 g by mouth daily. 10/07/17   Julianne Rice, MD  QUEtiapine (SEROQUEL) 25 MG tablet Take 25-50 mg at bedtime by mouth. 02/18/17   [provider]    Family History No family history on file.  Social History Social History   Tobacco Use  . Smoking status: Former Smoker    Packs/day: 3.00    Years: 19.00    Pack years: 57.00    Types: Cigarettes    Last attempt to quit: 05/20/1975    Years since quitting: 42.4  . Smokeless tobacco: Never Used  Substance Use Topics  . Alcohol use: No  . Drug use: No     Allergies   Patient has no known allergies.   Review of Systems Review of Systems  Constitutional: Negative for chills and fever.  Respiratory: Negative for shortness of breath.   Cardiovascular: Negative for chest pain.  Gastrointestinal: Positive for abdominal pain and constipation. Negative for diarrhea, nausea and vomiting.  Genitourinary: Positive for dysuria. Negative for flank pain and hematuria.  Musculoskeletal: Negative for back pain, myalgias, neck pain and neck stiffness.  Skin: Negative for rash and wound.  Neurological: Negative for dizziness, weakness, light-headedness, numbness and headaches.  All other systems reviewed and are negative.    Physical Exam Updated Vital  Signs BP (!) 162/79   Pulse 76   Temp 98.8 F (37.1 C) (Oral)   Resp 18   Ht 5\' 4"  (1.626 m)   Wt 48.1 kg (106 lb)   SpO2 96%   BMI 18.19 kg/m   Physical Exam  Constitutional: She is oriented to person, place, and time. She appears well-developed and well-nourished.  HENT:  Head: Normocephalic and atraumatic.  Mouth/Throat: Oropharynx is clear and moist. No oropharyngeal exudate.  Eyes: Pupils are equal, round, and reactive to light. EOM are normal.  Neck: Normal range of motion. Neck supple.  Cardiovascular: Normal rate and regular rhythm. Exam reveals no gallop and no friction rub.  No murmur heard. Pulmonary/Chest: Effort normal and breath sounds normal. No stridor. No respiratory distress. She has no wheezes. She has no rales. She exhibits no tenderness.  Abdominal: Soft. Bowel sounds are normal. There is tenderness. There is no rebound and no guarding.  Very mild suprapubic discomfort.  No  rebound or guarding.  Genitourinary:  Genitourinary Comments: Foley catheter in place with cloudy urine.  Musculoskeletal: Normal range of motion. She exhibits no edema or tenderness.  No CVA tenderness bilaterally.  Neurological: She is alert and oriented to person, place, and time.  Skin: Skin is warm and dry. No rash noted. No erythema.  Psychiatric: She has a normal mood and affect. Her behavior is normal.  Nursing note and vitals reviewed.    ED Treatments / Results  Labs (all labs ordered are listed, but only abnormal results are displayed) Labs Reviewed  URINALYSIS, ROUTINE W REFLEX MICROSCOPIC - Abnormal; Notable for the following components:      Result Value   APPearance CLOUDY (*)    Specific Gravity, Urine 1.003 (*)    Hgb urine dipstick MODERATE (*)    Leukocytes, UA MODERATE (*)    Bacteria, UA MANY (*)    All other components within normal limits  CBC WITH DIFFERENTIAL/PLATELET - Abnormal; Notable for the following components:   WBC 3.2 (*)    Neutro Abs 1.6 (*)     All other components within normal limits  COMPREHENSIVE METABOLIC PANEL - Abnormal; Notable for the following components:   Sodium 132 (*)    Chloride 100 (*)    Creatinine, Ser 0.43 (*)    Albumin 3.1 (*)    ALT 10 (*)    All other components within normal limits  URINE CULTURE  LIPASE, BLOOD    EKG None  Radiology Dg Abdomen 1 View  Result Date: 10/07/2017 CLINICAL DATA:  Abdominal pain EXAM: ABDOMEN - 1 VIEW COMPARISON:  None. FINDINGS: Moderate amount of gas and stool throughout the colon. Stool particularly plentiful in the rectosigmoid region. Small bowel pattern is normal. No sign of free air. Chronic curvature in degenerative change of the spine. 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 IMPRESSION: Fairly large amount of gas and fecal matter in the colon, possibly with rectal impaction. Electronically Signed   By: Nelson Chimes M.D.   On: 10/07/2017 18:28    Procedures Procedures (including critical care time)  Medications Ordered in ED Medications  levofloxacin (LEVAQUIN) tablet 500 mg (has no administration in time range)     Initial Impression / Assessment and Plan / ED Course  I have reviewed the triage vital signs and the nursing notes.  Pertinent labs & imaging results that were available during my care of the patient were reviewed by me and considered in my medical decision making (see chart for details).    Evidence of urinary tract infection.  Urine sent for culture.  Foley catheter was exchanged.  Patient had bacteria sensitive to Levaquin and previous urine culture.  Given first dose of Levaquin here.  Will send home with 6-day course.  Patient also has evidence of constipation on her x-ray.  Start on MiraLAX.  Advised to follow-up closely with her primary physician.  Return precautions given.   Final Clinical Impressions(s) / ED Diagnoses   Final diagnoses:  Lower urinary tract infection  Constipation,  unspecified constipation type    ED Discharge Orders        Ordered    levofloxacin (LEVAQUIN) 500 MG tablet  Daily     10/07/17 1909    polyethylene glycol (MIRALAX / GLYCOLAX) packet  Daily     10/07/17 1909       Julianne Rice, MD 10/07/17 1911

## 2017-10-07 NOTE — ED Triage Notes (Signed)
Patient c/o strong foul odor to urine and dysuria. Patient has foley cath in place. Per patient has had foley since November of 2018-goes to [facility]. Patient states Foley last changed x1 month ago. Denies any fevers.

## 2017-10-10 LAB — URINE CULTURE: Culture: >=100000 — AB

## 2017-10-11 ENCOUNTER — Telehealth: Payer: Self-pay | Admitting: Emergency Medicine

## 2017-10-11 NOTE — Telephone Encounter (Signed)
Post ED Visit - Positive Culture Follow-up  Culture report reviewed by antimicrobial stewardship pharmacist:  []  Elenor Quinones, Pharm.D. []  Heide Guile, Pharm.D., BCPS AQ-ID []  Parks Neptune, Pharm.D., BCPS []  Alycia Rossetti, Pharm.D., BCPS []  [place], [place].D., BCPS, AAHIVP []  [name], Pharm.D., BCPS, AAHIVP []  Salome Arnt, PharmD, BCPS []  Wynell Balloon, PharmD []  Vincenza Hews, PharmD, BCPS Maxcine Ham PharmD  Positive urine culture Treated with levofloxacin, organism sensitive to the same and no further patient follow-up is required at this time.  Hazle Nordmann 10/11/2017, 1:18 PM

## 2017-11-03 ENCOUNTER — Ambulatory Visit (INDEPENDENT_AMBULATORY_CARE_PROVIDER_SITE_OTHER): Payer: Medicare Other | Admitting: Urology

## 2017-11-03 DIAGNOSIS — N3021 Other chronic cystitis with hematuria: Secondary | ICD-10-CM

## 2017-11-03 DIAGNOSIS — N3941 Urge incontinence: Secondary | ICD-10-CM

## 2017-11-10 ENCOUNTER — Ambulatory Visit (INDEPENDENT_AMBULATORY_CARE_PROVIDER_SITE_OTHER): Payer: Medicare Other | Admitting: Urology

## 2017-11-10 DIAGNOSIS — N3021 Other chronic cystitis with hematuria: Secondary | ICD-10-CM | POA: Diagnosis not present

## 2017-11-10 DIAGNOSIS — N3941 Urge incontinence: Secondary | ICD-10-CM | POA: Diagnosis not present

## 2017-11-24 ENCOUNTER — Other Ambulatory Visit (HOSPITAL_COMMUNITY)
Admission: RE | Admit: 2017-11-24 | Discharge: 2017-11-24 | Disposition: A | Discharge status: Home / Self Care | Payer: Medicare Other | Source: Ambulatory Visit | Attending: Urology | Admitting: Urology

## 2017-11-24 ENCOUNTER — Ambulatory Visit (INDEPENDENT_AMBULATORY_CARE_PROVIDER_SITE_OTHER): Payer: Medicare Other | Admitting: Urology

## 2017-11-24 DIAGNOSIS — N3941 Urge incontinence: Secondary | ICD-10-CM

## 2017-11-24 DIAGNOSIS — R310 Gross hematuria: Secondary | ICD-10-CM | POA: Insufficient documentation

## 2017-11-24 DIAGNOSIS — N3021 Other chronic cystitis with hematuria: Secondary | ICD-10-CM | POA: Diagnosis not present

## 2017-11-26 LAB — URINE CULTURE: Culture: >=100000 — AB

## 2018-01-05 ENCOUNTER — Other Ambulatory Visit (HOSPITAL_COMMUNITY)
Admission: RE | Admit: 2018-01-05 | Discharge: 2018-01-05 | Disposition: A | Discharge status: Home / Self Care | Payer: Medicare Other | Source: Other Acute Inpatient Hospital | Attending: Urology | Admitting: Urology

## 2018-01-05 ENCOUNTER — Ambulatory Visit (INDEPENDENT_AMBULATORY_CARE_PROVIDER_SITE_OTHER): Payer: Medicare Other | Admitting: Urology

## 2018-01-05 DIAGNOSIS — N3941 Urge incontinence: Secondary | ICD-10-CM | POA: Diagnosis not present

## 2018-01-05 DIAGNOSIS — N3021 Other chronic cystitis with hematuria: Secondary | ICD-10-CM | POA: Diagnosis present

## 2018-01-08 LAB — URINE CULTURE: Culture: >=100000 — AB

## 2018-02-16 ENCOUNTER — Ambulatory Visit (INDEPENDENT_AMBULATORY_CARE_PROVIDER_SITE_OTHER): Payer: Medicare Other | Admitting: Urology

## 2018-02-16 DIAGNOSIS — N3021 Other chronic cystitis with hematuria: Secondary | ICD-10-CM

## 2018-02-16 DIAGNOSIS — N3941 Urge incontinence: Secondary | ICD-10-CM | POA: Diagnosis not present

## 2018-05-18 ENCOUNTER — Ambulatory Visit (INDEPENDENT_AMBULATORY_CARE_PROVIDER_SITE_OTHER): Payer: Medicare Other | Admitting: Urology

## 2018-05-18 DIAGNOSIS — N3021 Other chronic cystitis with hematuria: Secondary | ICD-10-CM | POA: Diagnosis not present

## 2018-05-18 DIAGNOSIS — N3941 Urge incontinence: Secondary | ICD-10-CM

## 2018-10-17 ENCOUNTER — Emergency Department (HOSPITAL_COMMUNITY)
Admission: EM | Admit: 2018-10-17 | Discharge: 2018-10-18 | Disposition: A | Discharge status: Home / Self Care | Payer: Medicare Other | Attending: Emergency Medicine | Admitting: Emergency Medicine

## 2018-10-17 ENCOUNTER — Encounter (HOSPITAL_COMMUNITY): Payer: Self-pay | Admitting: *Deleted

## 2018-10-17 ENCOUNTER — Emergency Department (HOSPITAL_COMMUNITY): Payer: Medicare Other

## 2018-10-17 ENCOUNTER — Other Ambulatory Visit: Payer: Self-pay

## 2018-10-17 DIAGNOSIS — R531 Weakness: Secondary | ICD-10-CM | POA: Insufficient documentation

## 2018-10-17 DIAGNOSIS — G939 Disorder of brain, unspecified: Secondary | ICD-10-CM | POA: Diagnosis not present

## 2018-10-17 DIAGNOSIS — Z7982 Long term (current) use of aspirin: Secondary | ICD-10-CM | POA: Diagnosis not present

## 2018-10-17 DIAGNOSIS — R29898 Other symptoms and signs involving the musculoskeletal system: Secondary | ICD-10-CM

## 2018-10-17 DIAGNOSIS — I10 Essential (primary) hypertension: Secondary | ICD-10-CM | POA: Insufficient documentation

## 2018-10-17 DIAGNOSIS — E119 Type 2 diabetes mellitus without complications: Secondary | ICD-10-CM | POA: Insufficient documentation

## 2018-10-17 DIAGNOSIS — Z87891 Personal history of nicotine dependence: Secondary | ICD-10-CM | POA: Diagnosis not present

## 2018-10-17 DIAGNOSIS — Z79899 Other long term (current) drug therapy: Secondary | ICD-10-CM | POA: Diagnosis not present

## 2018-10-17 DIAGNOSIS — M79602 Pain in left arm: Secondary | ICD-10-CM | POA: Insufficient documentation

## 2018-10-17 MED ORDER — TRAMADOL HCL 50 MG PO TABS
50.0000 mg | ORAL_TABLET | Freq: Once | ORAL | Status: AC
  Administered 2018-10-17: 22:00:00 50 mg via ORAL
  Filled 2018-10-17: qty 1

## 2018-10-17 NOTE — ED Triage Notes (Signed)
Pt left arm pain for months but has gotten worse.

## 2018-10-17 NOTE — ED Notes (Signed)
Edp to room  

## 2018-10-18 LAB — TROPONIN I: Troponin I: <0.03 ng/mL (ref <0.03)

## 2018-10-18 MED ORDER — TRAMADOL HCL 50 MG PO TABS: 50.0000 mg | ORAL_TABLET | Freq: Four times a day (QID) | ORAL | 0 refills | Status: DC | PRN

## 2018-10-18 MED ORDER — TRAMADOL HCL 50 MG PO TABS
50.0000 mg | ORAL_TABLET | Freq: Once | ORAL | Status: AC
Start: 2018-10-18 — End: 2018-10-18
  Administered 2018-10-18: 50 mg via ORAL
  Filled 2018-10-18: qty 1

## 2018-10-18 NOTE — Discharge Instructions (Addendum)
As discussed, you may take tramadol as prescribed for your pain.  This may make you drowsy and may also cause constipation.  You may want to take a stool softener such as colace (docusate sodium) to prevent constipation.   Your CT imaging tonight does not show [place] the reason for your hand weakness, but it does show a possible old stroke or a small tumor in your cerebellum (the part of the brain that gives [place] our balance).  It is recommended that you have an MRI of your brain.  Call your doctor to get this test scheduled as an outpatient.  I have provided a copy of the CT report to give to your doctor.

## 2018-10-18 NOTE — ED Notes (Signed)
Called son to get patient. Son coming from [place]

## 2018-10-18 NOTE — ED Provider Notes (Addendum)
[facility] EMERGENCY DEPARTMENT Provider Note   CSN: [id number] Arrival date & time: 10/17/18  1922    History   Chief Complaint Chief Complaint  Patient presents with   Arm Pain    HPI [name] is a 85 y.o. female with a history of anemia, generalized arthritis, GERD, hypertension type 2 diabetes and history of hip and left femur fracture with resultant chronic loss of ability to ambulate presenting with left arm pain which is been present for the past 5 months but has been worsening.  She describes sharp pain of her entire left arm with radiation into the shoulder and across her chest.  She also reports weakness in her hands, right greater than left.  This symptom has also been present since at least February of this year.  She reports frequently dropping a drinking glass if she holds it in her right hand.  Patient lives with her son who is available via phone during this interview.  He describes that she refuses to lay in her bed at night as this worsens her left arm pain, and she adds particularly lying on her left side causes severe pain.  She sleeps in her wheelchair to avoid sx.  She has not seen her primary doctor for this problem.   She denies shortness of breath, fevers or chills, no abdominal pain nausea or vomiting. She has found no alleviators.         The history is provided by the patient and a relative.    Past Medical History:  Diagnosis Date   Anemia    takes Ferrous Gluconate daily   Anxiety    Arthritis    "hands, feet, bad arthritis all over" (03/15/2017)   Bell's palsy    Depression    Fall 03/15/2017   resulting in left hip dislocation and periprostatic femur fracture./notes 03/15/2017   Falls frequently    "couple times here recently" (03/15/2017)   GERD (gastroesophageal reflux disease)    Heart murmur    Hypertension    takes Lisinopril and Amlodipine daily   Left bundle branch block    Periprosthetic fracture of femur following  total replacement of hip    Left   Pressure injury of sacral region, stage 3 ([place]) 03/21/2017   Thoracic aortic atherosclerosis ([place]) 03/17/2017   Type II diabetes mellitus ([place])     Patient Active Problem List   Diagnosis Date Noted   UTI (urinary tract infection) 09/02/2017   GERD (gastroesophageal reflux disease) 09/02/2017   Anxiety with depression 09/02/2017   Altered mental status 09/02/2017   Tachycardia 09/02/2017   Goals of care, counseling/discussion    Palliative care by specialist    Chest pain 06/20/2017   HCAP (healthcare-associated pneumonia) 06/20/2017   Pressure injury of sacral region, stage 3 ([place]) 03/21/2017   Dislocated hip, left, initial encounter ([place]) 03/19/2017   Thoracic aortic atherosclerosis ([place]) 03/17/2017   Pressure injury of skin 03/17/2017   Femur fracture, left ([place]) 06/09/2012   Acute blood loss anemia 05/24/2012   Hyponatremia 05/20/2012   Anemia, chronic disease 05/20/2012   Left bundle branch block 05/20/2012   Hip fracture ([place]) 05/19/2012   Hypertension 05/19/2012    Past Surgical History:  Procedure Laterality Date   APPENDECTOMY     FRACTURE SURGERY     HIP ARTHROPLASTY  05/20/2012   Procedure: ARTHROPLASTY BIPOLAR HIP;  Surgeon: Marybelle Killings, MD;  Location: [place];  Service: Orthopedics;  Laterality: Left;  Left monopolar hemiarthroplasty  ORIF FEMUR FRACTURE Left 03/17/2017   Procedure: REMOVAL OF HEMIARTHROPLASTY, GIRDLESTONE, OPEN REDUCTION INTERNAL FIXATION OF FEMUR FRACTURE, PLATING OF FEMUR FRACTURE;  Surgeon: Marybelle Killings, MD;  Location: [place];  Service: Orthopedics;  Laterality: Left;   TOTAL HIP REVISION Left 06/08/2012   Procedure: TOTAL HIP REVISION;  Surgeon: Marybelle Killings, MD;  Location: [place];  Service: Orthopedics;  Laterality: Left;  Revision left hip hemiarthroplasty for periprosthetic fracture   TUBAL LIGATION       OB History   No obstetric history on file.      Home Medications     Prior to Admission medications   Medication Sig Start Date End Date Taking? Authorizing Provider  acetaminophen (TYLENOL) 325 MG tablet Take 2 tablets (650 mg total) by mouth every 6 (six) hours as needed for mild pain. 03/21/17  Yes Samuella Cota, MD  ALPRAZolam Duanne Moron) 0.25 MG tablet Take 0.25 mg by mouth at bedtime as needed for anxiety.    [provider]  amitriptyline (ELAVIL) 25 MG tablet Take 25-50 mg daily by mouth. 03/10/17   [provider]  aspirin 81 MG chewable tablet Chew 81 mg by mouth daily.    [provider]  dorzolamide-timolol (COSOPT) 22.3-6.8 MG/ML ophthalmic solution Place 1 drop 2 (two) times daily into both eyes. 03/10/17   [provider]  ferrous sulfate 325 (65 FE) MG tablet Take 325 mg 2 (two) times daily by mouth. 02/02/17   [provider]  KLOR-CON M20 20 MEQ tablet Take 20 mEq daily by mouth. with food 03/09/17   [provider]  latanoprost (XALATAN) 0.005 % ophthalmic solution Place 1 drop at bedtime into both eyes. 03/09/17   [provider]  levofloxacin (LEVAQUIN) 500 MG tablet Take 1 tablet (500 mg total) by mouth daily. 10/08/17   Julianne Rice, MD  lisinopril (PRINIVIL,ZESTRIL) 20 MG tablet Take 20 mg by mouth daily.    [provider]  pantoprazole (PROTONIX) 40 MG tablet Take 40 mg daily before breakfast by mouth. 02/24/17   [provider]  polyethylene glycol (MIRALAX / GLYCOLAX) packet Take 17 g by mouth daily. 10/07/17   Julianne Rice, MD  QUEtiapine (SEROQUEL) 25 MG tablet Take 25-50 mg at bedtime by mouth. 02/18/17   [provider]  traMADol (ULTRAM) 50 MG tablet Take 1 tablet (50 mg total) by mouth every 6 (six) hours as needed for moderate pain. 10/18/18   Evalee Jefferson, PA-C    Family History History reviewed. No pertinent family history.  Social History Social History   Tobacco Use   Smoking status: Former Smoker    Packs/day: 3.00     Years: 19.00    Pack years: 57.00    Types: Cigarettes    Quit date: 05/20/1975    Years since quitting: 43.4   Smokeless tobacco: Never Used  Substance Use Topics   Alcohol use: No   Drug use: No     Allergies   Patient has no known allergies.   Review of Systems Review of Systems  Constitutional: Negative for fever.  HENT: Negative for congestion and sore throat.   Eyes: Negative.   Respiratory: Negative for chest tightness and shortness of breath.   Cardiovascular: Negative for chest pain.  Gastrointestinal: Negative for abdominal pain and nausea.  Genitourinary: Negative.   Musculoskeletal: Positive for arthralgias. Negative for joint swelling and neck pain.  Skin: Negative.  Negative for rash and wound.  Neurological: Positive for weakness. Negative for dizziness,  light-headedness, numbness and headaches.  Psychiatric/Behavioral: Negative.      Physical Exam Updated Vital Signs BP (!) 181/80 (BP Location: Right Arm)    Pulse 81    Temp 98.4 F (36.9 C) (Oral)    Resp 16    SpO2 93%   Physical Exam Vitals signs and nursing note reviewed.  Constitutional:      Appearance: She is well-developed.     Comments: Cachectic female, well groomed.  HENT:     Head: Normocephalic and atraumatic.  Eyes:     Conjunctiva/sclera: Conjunctivae normal.  Neck:     Musculoskeletal: Normal range of motion. No muscular tenderness.  Cardiovascular:     Rate and Rhythm: Normal rate and regular rhythm.     Heart sounds: Normal heart sounds.  Pulmonary:     Effort: Pulmonary effort is normal.     Breath sounds: Normal breath sounds. No wheezing.  Abdominal:     General: Bowel sounds are normal.     Palpations: Abdomen is soft.     Tenderness: There is no abdominal tenderness.  Musculoskeletal: Normal range of motion.  Skin:    General: Skin is warm and dry.  Neurological:     Mental Status: She is alert and oriented to person, place, and time.     Sensory: No sensory  deficit.     Motor: Weakness present.     Comments: Grip strength 5 left, 3 right. Unable to perform pronator drift due to left arm pain. Not ambulatory at baseline.      ED Treatments / Results  Labs (all labs ordered are listed, but only abnormal results are displayed) Labs Reviewed  TROPONIN I    EKG EKG Interpretation  Date/Time:  Monday October 17 2018 23:12:37 EDT Ventricular Rate:  70 PR Interval:    QRS Duration: 134 QT Interval:  453 QTC Calculation: 489 R Axis:   71 Text Interpretation:  Sinus rhythm Left bundle branch block Non-specific ST-t changes Confirmed by Lajean Saver [phone]) on 10/18/2018 11:26:00 AM  Stable ekg compared to 06/20/17  Radiology Ct Head Wo Contrast  Result Date: 10/17/2018 CLINICAL DATA:  Worsening left arm pain EXAM: CT HEAD WITHOUT CONTRAST CT CERVICAL SPINE WITHOUT CONTRAST TECHNIQUE: Multidetector CT imaging of the head and cervical spine was performed following the standard protocol without intravenous contrast. Multiplanar CT image reconstructions of the cervical spine were also generated. COMPARISON:  None. FINDINGS: CT HEAD FINDINGS Brain: There is no hemorrhage or extra-axial collection. There is generalized atrophy without lobar predilection. Areas of hypoattenuation of the deep gray nuclei and confluent periventricular white matter hypodensity, consistent with chronic small vessel disease. Multiple old small vessel infarcts. Round lesion of the right cerebellum measuring 9 mm. Vascular: No abnormal hyperdensity of the major intracranial arteries or dural venous sinuses. No intracranial atherosclerosis. Skull: The visualized skull base, calvarium and extracranial soft tissues are normal. Sinuses/Orbits: No fluid levels or advanced mucosal thickening of the visualized paranasal sinuses. No mastoid or middle ear effusion. The orbits are normal. CT CERVICAL SPINE FINDINGS Alignment: Grade 1 anterolisthesis at C3-4 and C4-5. Facets are aligned.  Occipital condyles are normally positioned. Skull base and vertebrae: No acute fracture. Soft tissues and spinal canal: No prevertebral fluid or swelling. No visible canal hematoma. Disc levels: There is multilevel disc space loss. No bony spinal canal stenosis. Facets are fused from C3-C5. Upper chest: No pneumothorax, pulmonary nodule or pleural effusion. Other: Normal visualized paraspinal cervical soft tissues. IMPRESSION: 1. No intracranial hemorrhage. 2.  Rounded focus of hypoattenuation in the right cerebellum could be a small, age indeterminate infarct, though a small mass may also have this appearance. MRI of the brain with without contrast is recommended. 3. Multilevel degenerative disc disease without acute fracture of the cervical spine. Electronically Signed   By: Ulyses Jarred M.D.   On: 10/17/2018 23:17   Ct Cervical Spine Wo Contrast  Result Date: 10/17/2018 CLINICAL DATA:  Worsening left arm pain EXAM: CT HEAD WITHOUT CONTRAST CT CERVICAL SPINE WITHOUT CONTRAST TECHNIQUE: Multidetector CT imaging of the head and cervical spine was performed following the standard protocol without intravenous contrast. Multiplanar CT image reconstructions of the cervical spine were also generated. COMPARISON:  None. FINDINGS: CT HEAD FINDINGS Brain: There is no hemorrhage or extra-axial collection. There is generalized atrophy without lobar predilection. Areas of hypoattenuation of the deep gray nuclei and confluent periventricular white matter hypodensity, consistent with chronic small vessel disease. Multiple old small vessel infarcts. Round lesion of the right cerebellum measuring 9 mm. Vascular: No abnormal hyperdensity of the major intracranial arteries or dural venous sinuses. No intracranial atherosclerosis. Skull: The visualized skull base, calvarium and extracranial soft tissues are normal. Sinuses/Orbits: No fluid levels or advanced mucosal thickening of the visualized paranasal sinuses. No mastoid or  middle ear effusion. The orbits are normal. CT CERVICAL SPINE FINDINGS Alignment: Grade 1 anterolisthesis at C3-4 and C4-5. Facets are aligned. Occipital condyles are normally positioned. Skull base and vertebrae: No acute fracture. Soft tissues and spinal canal: No prevertebral fluid or swelling. No visible canal hematoma. Disc levels: There is multilevel disc space loss. No bony spinal canal stenosis. Facets are fused from C3-C5. Upper chest: No pneumothorax, pulmonary nodule or pleural effusion. Other: Normal visualized paraspinal cervical soft tissues. IMPRESSION: 1. No intracranial hemorrhage. 2. Rounded focus of hypoattenuation in the right cerebellum could be a small, age indeterminate infarct, though a small mass may also have this appearance. MRI of the brain with without contrast is recommended. 3. Multilevel degenerative disc disease without acute fracture of the cervical spine. Electronically Signed   By: Ulyses Jarred M.D.   On: 10/17/2018 23:17   Dg Chest Portable 1 View  Result Date: 10/18/2018 CLINICAL DATA:  85 year old female with chest pain. EXAM: PORTABLE CHEST 1 VIEW COMPARISON:  Chest radiograph dated 06/20/2017 FINDINGS: Evaluation is limited due to positioning of the patient and superimposition of the mandible over the upper lung. There is no focal consolidation or pneumothorax. Probable small left pleural effusion and left lung base atelectatic changes. There is top-normal cardiac size. Dextroscoliosis of the thoracic spine. No acute osseous pathology. IMPRESSION: Probable small left pleural effusion and left lung base atelectatic changes. Electronically Signed   By: Anner Crete M.D.   On: 10/18/2018 00:05    Procedures Procedures (including critical care time)  Medications Ordered in ED Medications  traMADol (ULTRAM) tablet 50 mg (50 mg Oral Given 10/17/18 2147)  traMADol (ULTRAM) tablet 50 mg (50 mg Oral Given 10/18/18 0052)     Initial Impression / Assessment and Plan  / ED Course  I have reviewed the triage vital signs and the nursing notes.  Pertinent labs & imaging results that were available during my care of the patient were reviewed by me and considered in my medical decision making (see chart for details).       Pt was given tramadol here with relief of arm pain, she was supine and sleeping prior to dc.  Call to son [name] and discussed findings including  CT finding of cerebellar lesion.  Discussed admission for further evaluation vs outpatient followup with her pcp to arrange MRI images.  He prefers outpt f/u and this is reasonable given the chronic nature of her sx.  He will call her pcp tomorrow for f/u care. Small quantity of tramadol prescribed.  Final Clinical Impressions(s) / ED Diagnoses   Final diagnoses:  Left arm pain  Hand weakness  Lesion of cerebellum    ED Discharge Orders         Ordered    traMADol (ULTRAM) 50 MG tablet  Every 6 hours PRN     10/18/18 0121           Evalee Jefferson, PA-C 10/18/18 0134    Evalee Jefferson, PA-C 10/18/18 1412    Milton Ferguson, MD 10/18/18 1610

## 2018-11-20 ENCOUNTER — Inpatient Hospital Stay (HOSPITAL_COMMUNITY)
Admission: EM | Admit: 2018-11-20 | Discharge: 2018-12-01 | DRG: 194 | Disposition: A | Discharge status: Home Health | Payer: Medicare Other | Attending: Family Medicine | Admitting: Family Medicine

## 2018-11-20 ENCOUNTER — Emergency Department (HOSPITAL_COMMUNITY): Payer: Medicare Other

## 2018-11-20 ENCOUNTER — Other Ambulatory Visit: Payer: Self-pay

## 2018-11-20 ENCOUNTER — Encounter (HOSPITAL_COMMUNITY): Payer: Self-pay | Admitting: Emergency Medicine

## 2018-11-20 DIAGNOSIS — J189 Pneumonia, unspecified organism: Secondary | ICD-10-CM | POA: Diagnosis present

## 2018-11-20 DIAGNOSIS — M79606 Pain in leg, unspecified: Secondary | ICD-10-CM | POA: Diagnosis present

## 2018-11-20 DIAGNOSIS — R531 Weakness: Secondary | ICD-10-CM

## 2018-11-20 DIAGNOSIS — G8929 Other chronic pain: Secondary | ICD-10-CM | POA: Diagnosis present

## 2018-11-20 DIAGNOSIS — R8290 Unspecified abnormal findings in urine: Secondary | ICD-10-CM | POA: Diagnosis present

## 2018-11-20 DIAGNOSIS — Z79899 Other long term (current) drug therapy: Secondary | ICD-10-CM

## 2018-11-20 DIAGNOSIS — I447 Left bundle-branch block, unspecified: Secondary | ICD-10-CM | POA: Diagnosis present

## 2018-11-20 DIAGNOSIS — F419 Anxiety disorder, unspecified: Secondary | ICD-10-CM | POA: Diagnosis present

## 2018-11-20 DIAGNOSIS — M79604 Pain in right leg: Secondary | ICD-10-CM | POA: Diagnosis present

## 2018-11-20 DIAGNOSIS — R296 Repeated falls: Secondary | ICD-10-CM | POA: Diagnosis present

## 2018-11-20 DIAGNOSIS — Z9181 History of falling: Secondary | ICD-10-CM

## 2018-11-20 DIAGNOSIS — E222 Syndrome of inappropriate secretion of antidiuretic hormone: Secondary | ICD-10-CM | POA: Diagnosis present

## 2018-11-20 DIAGNOSIS — R569 Unspecified convulsions: Secondary | ICD-10-CM | POA: Diagnosis present

## 2018-11-20 DIAGNOSIS — E119 Type 2 diabetes mellitus without complications: Secondary | ICD-10-CM | POA: Diagnosis present

## 2018-11-20 DIAGNOSIS — F919 Conduct disorder, unspecified: Secondary | ICD-10-CM | POA: Diagnosis present

## 2018-11-20 DIAGNOSIS — K219 Gastro-esophageal reflux disease without esophagitis: Secondary | ICD-10-CM | POA: Diagnosis present

## 2018-11-20 DIAGNOSIS — R262 Difficulty in walking, not elsewhere classified: Secondary | ICD-10-CM | POA: Diagnosis present

## 2018-11-20 DIAGNOSIS — J181 Lobar pneumonia, unspecified organism: Secondary | ICD-10-CM | POA: Diagnosis present

## 2018-11-20 DIAGNOSIS — M79605 Pain in left leg: Secondary | ICD-10-CM | POA: Diagnosis present

## 2018-11-20 DIAGNOSIS — Z7982 Long term (current) use of aspirin: Secondary | ICD-10-CM | POA: Diagnosis not present

## 2018-11-20 DIAGNOSIS — F329 Major depressive disorder, single episode, unspecified: Secondary | ICD-10-CM | POA: Diagnosis present

## 2018-11-20 DIAGNOSIS — I10 Essential (primary) hypertension: Secondary | ICD-10-CM | POA: Diagnosis present

## 2018-11-20 DIAGNOSIS — E871 Hypo-osmolality and hyponatremia: Secondary | ICD-10-CM | POA: Diagnosis present

## 2018-11-20 DIAGNOSIS — Z515 Encounter for palliative care: Secondary | ICD-10-CM | POA: Diagnosis not present

## 2018-11-20 DIAGNOSIS — Z96642 Presence of left artificial hip joint: Secondary | ICD-10-CM | POA: Diagnosis present

## 2018-11-20 DIAGNOSIS — M40209 Unspecified kyphosis, site unspecified: Secondary | ICD-10-CM | POA: Diagnosis present

## 2018-11-20 DIAGNOSIS — E860 Dehydration: Secondary | ICD-10-CM | POA: Diagnosis present

## 2018-11-20 DIAGNOSIS — Z87891 Personal history of nicotine dependence: Secondary | ICD-10-CM

## 2018-11-20 DIAGNOSIS — Z20828 Contact with and (suspected) exposure to other viral communicable diseases: Secondary | ICD-10-CM | POA: Diagnosis present

## 2018-11-20 DIAGNOSIS — Z993 Dependence on wheelchair: Secondary | ICD-10-CM

## 2018-11-20 DIAGNOSIS — D638 Anemia in other chronic diseases classified elsewhere: Secondary | ICD-10-CM | POA: Diagnosis present

## 2018-11-20 DIAGNOSIS — F0390 Unspecified dementia without behavioral disturbance: Secondary | ICD-10-CM | POA: Diagnosis present

## 2018-11-20 DIAGNOSIS — Z7189 Other specified counseling: Secondary | ICD-10-CM | POA: Diagnosis not present

## 2018-11-20 LAB — TSH: TSH: 1.835 u[IU]/mL (ref 0.350–4.500)

## 2018-11-20 LAB — CBC WITH DIFFERENTIAL/PLATELET
Abs Immature Granulocytes: 0.02 10*3/uL (ref 0.00–0.07)
Basophils Absolute: 0 10*3/uL (ref 0.0–0.1)
Basophils Relative: 1 %
Eosinophils Absolute: 0.3 10*3/uL (ref 0.0–0.5)
Eosinophils Relative: 6 %
HCT: 32.1 % — ABNORMAL LOW (ref 36.0–46.0)
Hemoglobin: 10.4 g/dL — ABNORMAL LOW (ref 12.0–15.0)
Immature Granulocytes: 0 %
Lymphocytes Relative: 14 %
Lymphs Abs: 0.6 10*3/uL — ABNORMAL LOW (ref 0.7–4.0)
MCH: 27.4 pg (ref 26.0–34.0)
MCHC: 32.4 g/dL (ref 30.0–36.0)
MCV: 84.7 fL (ref 80.0–100.0)
Monocytes Absolute: 0.9 10*3/uL (ref 0.1–1.0)
Monocytes Relative: 20 %
Neutro Abs: 2.7 10*3/uL (ref 1.7–7.7)
Neutrophils Relative %: 59 %
Platelets: 308 10*3/uL (ref 150–400)
RBC: 3.79 MIL/uL — ABNORMAL LOW (ref 3.87–5.11)
RDW: 12.7 % (ref 11.5–15.5)
WBC: 4.5 10*3/uL (ref 4.0–10.5)
nRBC: 0 % (ref 0.0–0.2)

## 2018-11-20 LAB — COMPREHENSIVE METABOLIC PANEL
ALT: 18 U/L (ref 0–44)
AST: 27 U/L (ref 15–41)
Albumin: 3.2 g/dL — ABNORMAL LOW (ref 3.5–5.0)
Alkaline Phosphatase: 75 U/L (ref 38–126)
Anion gap: 8 (ref 5–15)
BUN: 8 mg/dL (ref 8–23)
CO2: 25 mmol/L (ref 22–32)
Calcium: 9.1 mg/dL (ref 8.9–10.3)
Chloride: 93 mmol/L — ABNORMAL LOW (ref 98–111)
Creatinine, Ser: 0.36 mg/dL — ABNORMAL LOW (ref 0.44–1.00)
GFR calc Af Amer: >60 mL/min (ref >60)
GFR calc non Af Amer: >60 mL/min (ref >60)
Glucose, Bld: 92 mg/dL (ref 70–99)
Potassium: 3.9 mmol/L (ref 3.5–5.1)
Sodium: 126 mmol/L — ABNORMAL LOW (ref 135–145)
Total Bilirubin: 0.6 mg/dL (ref 0.3–1.2)
Total Protein: 7.9 g/dL (ref 6.5–8.1)

## 2018-11-20 LAB — URINALYSIS, ROUTINE W REFLEX MICROSCOPIC
Bilirubin Urine: NEGATIVE
Glucose, UA: NEGATIVE mg/dL
Ketones, ur: NEGATIVE mg/dL
Nitrite: NEGATIVE
Protein, ur: NEGATIVE mg/dL
Specific Gravity, Urine: 1.002 — ABNORMAL LOW (ref 1.005–1.030)
pH: 8 (ref 5.0–8.0)

## 2018-11-20 LAB — TROPONIN I (HIGH SENSITIVITY)
Troponin I (High Sensitivity): 9 ng/L (ref <18)
Troponin I (High Sensitivity): 10 ng/L (ref <18)

## 2018-11-20 LAB — SARS CORONAVIRUS 2 BY RT PCR (HOSPITAL ORDER, PERFORMED IN ~~LOC~~ HOSPITAL LAB): SARS Coronavirus 2: NEGATIVE

## 2018-11-20 LAB — LIPASE, BLOOD: Lipase: 53 U/L — ABNORMAL HIGH (ref 11–51)

## 2018-11-20 MED ORDER — FENTANYL CITRATE (PF) 100 MCG/2ML IJ SOLN
25.0000 ug | Freq: Once | INTRAMUSCULAR | Status: AC
  Administered 2018-11-20: 25 ug via INTRAVENOUS
  Filled 2018-11-20: qty 2

## 2018-11-20 MED ORDER — FERROUS SULFATE 325 (65 FE) MG PO TABS
325.0000 mg | ORAL_TABLET | Freq: Every day | ORAL | Status: DC
  Administered 2018-11-21 – 2018-12-01 (×10): 325 mg via ORAL
  Filled 2018-11-20 (×10): qty 1

## 2018-11-20 MED ORDER — SODIUM CHLORIDE 0.9 % IV BOLUS
1000.0000 mL | Freq: Once | INTRAVENOUS | Status: AC
  Administered 2018-11-20: 1000 mL via INTRAVENOUS

## 2018-11-20 MED ORDER — SODIUM CHLORIDE 0.9 % IV SOLN
500.0000 mg | INTRAVENOUS | Status: DC
  Administered 2018-11-20 – 2018-11-22 (×3): 500 mg via INTRAVENOUS
  Filled 2018-11-20 (×3): qty 500

## 2018-11-20 MED ORDER — ENOXAPARIN SODIUM 40 MG/0.4ML ~~LOC~~ SOLN
40.0000 mg | SUBCUTANEOUS | Status: DC
  Administered 2018-11-20 – 2018-11-28 (×8): 40 mg via SUBCUTANEOUS
  Filled 2018-11-20 (×9): qty 0.4

## 2018-11-20 MED ORDER — ORAL CARE MOUTH RINSE
15.0000 mL | Freq: Two times a day (BID) | OROMUCOSAL | Status: DC
  Administered 2018-11-20 – 2018-12-01 (×15): 15 mL via OROMUCOSAL

## 2018-11-20 MED ORDER — SODIUM CHLORIDE 0.9 % IV SOLN
1.0000 g | Freq: Once | INTRAVENOUS | Status: AC
  Administered 2018-11-20: 13:00:00 1 g via INTRAVENOUS
  Filled 2018-11-20: qty 10

## 2018-11-20 MED ORDER — SODIUM CHLORIDE 0.9 % IV SOLN
INTRAVENOUS | Status: DC
  Administered 2018-11-20 – 2018-11-22 (×3): via INTRAVENOUS

## 2018-11-20 MED ORDER — ENSURE ENLIVE PO LIQD
237.0000 mL | Freq: Two times a day (BID) | ORAL | Status: DC
  Administered 2018-11-21 – 2018-12-01 (×9): 237 mL via ORAL

## 2018-11-20 MED ORDER — ACETAMINOPHEN 325 MG PO TABS
650.0000 mg | ORAL_TABLET | Freq: Four times a day (QID) | ORAL | Status: DC
  Administered 2018-11-20 – 2018-12-01 (×32): 650 mg via ORAL
  Filled 2018-11-20 (×34): qty 2

## 2018-11-20 MED ORDER — LISINOPRIL 10 MG PO TABS
20.0000 mg | ORAL_TABLET | Freq: Every day | ORAL | Status: DC
  Administered 2018-11-21 – 2018-11-26 (×6): 20 mg via ORAL
  Filled 2018-11-20 (×6): qty 2

## 2018-11-20 MED ORDER — HYDRALAZINE HCL 20 MG/ML IJ SOLN: 10.0000 mg | INTRAMUSCULAR | Status: DC | PRN

## 2018-11-20 MED ORDER — ASPIRIN 81 MG PO CHEW
81.0000 mg | CHEWABLE_TABLET | Freq: Every day | ORAL | Status: DC
  Administered 2018-11-21 – 2018-12-01 (×10): 81 mg via ORAL
  Filled 2018-11-20 (×10): qty 1

## 2018-11-20 MED ORDER — GUAIFENESIN-DM 100-10 MG/5ML PO SYRP
10.0000 mL | ORAL_SOLUTION | Freq: Three times a day (TID) | ORAL | Status: DC
  Administered 2018-11-20 – 2018-11-26 (×14): 10 mL via ORAL
  Filled 2018-11-20 (×15): qty 10

## 2018-11-20 MED ORDER — ACETAMINOPHEN 325 MG PO TABS: 650.0000 mg | ORAL_TABLET | Freq: Once | ORAL | Status: DC

## 2018-11-20 MED ORDER — IPRATROPIUM-ALBUTEROL 0.5-2.5 (3) MG/3ML IN SOLN
3.0000 mL | Freq: Three times a day (TID) | RESPIRATORY_TRACT | Status: AC
  Administered 2018-11-20 – 2018-11-21 (×4): 3 mL via RESPIRATORY_TRACT
  Filled 2018-11-20 (×5): qty 3

## 2018-11-20 MED ORDER — PANTOPRAZOLE SODIUM 40 MG PO TBEC
40.0000 mg | DELAYED_RELEASE_TABLET | Freq: Every day | ORAL | Status: DC
  Administered 2018-11-21 – 2018-11-22 (×2): 40 mg via ORAL
  Filled 2018-11-20 (×2): qty 1

## 2018-11-20 MED ORDER — TRAMADOL HCL 50 MG PO TABS
50.0000 mg | ORAL_TABLET | Freq: Two times a day (BID) | ORAL | Status: DC | PRN
  Administered 2018-11-20 – 2018-12-01 (×6): 50 mg via ORAL
  Filled 2018-11-20 (×7): qty 1

## 2018-11-20 MED ORDER — SODIUM CHLORIDE 0.9 % IV SOLN
2.0000 g | INTRAVENOUS | Status: AC
  Administered 2018-11-21 – 2018-11-24 (×4): 2 g via INTRAVENOUS
  Filled 2018-11-20 (×4): qty 20

## 2018-11-20 NOTE — H&P (Addendum)
History and Physical  Jamelah Sitzer FKC:[id number] DOB: [birth year] DOA: 11/20/2018  Referring physician: Lacinda Axon, MD   PCP: Medicine, [facility] Internal   Chief Complaint: weakness  HPI: [name] is a 85 y.o. female who was brought in from home by 1 of her 5 children who cares for her reporting that she has been progressively weaker over the past several weeks and she has developed cough and complained of chest pain.  The patient has had poor appetite and not eating and drinking well.  The patient has not had a fever.  Patient has dementia.  The patient has a history of frequent falls but according to family members has been wheelchair-bound and not walking for the past several months.  The patient has a history of frequent UTIs and is on trimethoprim daily for prophylaxis.  The patient has a history of depression and anxiety.  The patient has GERD and hypertension and is followed by primary care for this.  The patient has had hip fractures and repair and left femoral plating.  The patient has chronic femur pain.  ED course: The patient was noted to be dehydrated clinically and hyponatremic with a sodium of 126.  The patient had chest x-ray findings of a multifocal right pneumonia.  Patient had a respiratory rate of 34 and blood pressure of 163/86.  Patient had a 94% oxygen saturation on room air.  Her troponin was 10.  Her glucose was 92.  Her SARS 2 COVID-19 test is pending her urinalysis showed many bacteria 6-10 WBC per high-power field.  TSH was 1.835.  Hemoglobin 10.4.  Platelet count 308.  Patient is being admitted for treatment of pneumonia and dehydration with hyponatremia and associated generalized weakness.  Review of Systems: Unable to obtain due to dementia  Past Medical History:  Diagnosis Date  . Anemia    takes Ferrous Gluconate daily  . Anxiety   . Arthritis    "hands, feet, bad arthritis all over" (03/15/2017)  . Bell's palsy   . Depression   . Fall 03/15/2017   resulting  in left hip dislocation and periprostatic femur fracture./notes 03/15/2017  . Falls frequently    "couple times here recently" (03/15/2017)  . GERD (gastroesophageal reflux disease)   . Heart murmur   . Hypertension    takes Lisinopril and Amlodipine daily  . Left bundle branch block   . Periprosthetic fracture of femur following total replacement of hip    Left  . Pressure injury of sacral region, stage 3 ([place]) 03/21/2017  . Thoracic aortic atherosclerosis ([place]) 03/17/2017  . Type II diabetes mellitus ([place])    Past Surgical History:  Procedure Laterality Date  . APPENDECTOMY    . FRACTURE SURGERY    . HIP ARTHROPLASTY  05/20/2012   Procedure: ARTHROPLASTY BIPOLAR HIP;  Surgeon: Marybelle Killings, MD;  Location: [place];  Service: Orthopedics;  Laterality: Left;  Left monopolar hemiarthroplasty  . ORIF FEMUR FRACTURE Left 03/17/2017   Procedure: REMOVAL OF HEMIARTHROPLASTY, GIRDLESTONE, OPEN REDUCTION INTERNAL FIXATION OF FEMUR FRACTURE, PLATING OF FEMUR FRACTURE;  Surgeon: Marybelle Killings, MD;  Location: [place];  Service: Orthopedics;  Laterality: Left;  . TOTAL HIP REVISION Left 06/08/2012   Procedure: TOTAL HIP REVISION;  Surgeon: Marybelle Killings, MD;  Location: [place];  Service: Orthopedics;  Laterality: Left;  Revision left hip hemiarthroplasty for periprosthetic fracture  . TUBAL LIGATION     Social History:  reports that she quit smoking about 43 years ago. Her  smoking use included cigarettes. She has a 57.00 pack-year smoking history. She has never used smokeless tobacco. She reports that she does not drink alcohol or use drugs.  No Known Allergies  History reviewed. No pertinent family history.  Prior to Admission medications   Medication Sig Start Date End Date Taking? Authorizing Provider  acetaminophen (TYLENOL) 325 MG tablet Take 2 tablets (650 mg total) by mouth every 6 (six) hours as needed for mild pain. 03/21/17  Yes Samuella Cota, MD  aspirin 81 MG chewable tablet Chew 81 mg  by mouth daily.   Yes [provider]  ferrous sulfate 325 (65 FE) MG tablet Take 325 mg 2 (two) times daily by mouth. 02/02/17  Yes [provider]  KLOR-CON M10 10 MEQ tablet  11/16/18  Yes [provider]  lisinopril (PRINIVIL,ZESTRIL) 20 MG tablet Take 20 mg by mouth daily.   Yes [provider]  pantoprazole (PROTONIX) 40 MG tablet Take 40 mg daily before breakfast by mouth. 02/24/17  Yes [provider]  trimethoprim (TRIMPEX) 100 MG tablet TAKE 1 TABLET BY MOUTH EVERYDAY AT BEDTIME 09/08/18  Yes [provider]   Physical Exam: Vitals:   11/20/18 1215 11/20/18 1230 11/20/18 1245 11/20/18 1300  BP:      Pulse:   98   Resp: (!) 43 (!) 24 (!) 41 (!) 29  Temp:      TempSrc:      SpO2:   96%   Weight:      Height:         General exam: Elderly female awake and confused, lethargic in no obvious distress.  Head, eyes and ENT: Nontraumatic and normocephalic. Pupils equally reacting to light and accommodation. Oral mucosa dry.  Neck: Supple. No JVD, carotid bruit or thyromegaly.  Lymphatics: No lymphadenopathy.  Respiratory system: Rales in the right lower lobe, tachypnea.  Mild increased work of breathing.  Cardiovascular system: S1 and S2 heard. No JVD, murmurs, gallops, clicks or pedal edema.  Gastrointestinal system: Abdomen is nondistended, soft and nontender. Normal bowel sounds heard. No organomegaly or masses appreciated.  Central nervous system: Alert and oriented to person only. No focal neurological deficits.  Extremities: Symmetric 5 x 5 power. Peripheral pulses symmetrically felt.   Skin: No rashes or acute findings.  Musculoskeletal system: Negative exam.  Psychiatry: Pleasant and cooperative.  Labs on Admission:  Basic Metabolic Panel: Recent Labs  Lab 11/20/18 1016  NA 126*  K 3.9  CL 93*  CO2 25  GLUCOSE 92  BUN 8  CREATININE 0.36*  CALCIUM 9.1   Liver Function Tests: Recent Labs  Lab  11/20/18 1016  AST 27  ALT 18  ALKPHOS 75  BILITOT 0.6  PROT 7.9  ALBUMIN 3.2*   Recent Labs  Lab 11/20/18 1016  LIPASE 53*   No results for input(s): AMMONIA in the last 168 hours. CBC: Recent Labs  Lab 11/20/18 1016  WBC 4.5  NEUTROABS 2.7  HGB 10.4*  HCT 32.1*  MCV 84.7  PLT 308   Cardiac Enzymes: No results for input(s): CKTOTAL, CKMB, CKMBINDEX, TROPONINI in the last 168 hours.  BNP (last 3 results) No results for input(s): PROBNP in the last 8760 hours. CBG: No results for input(s): GLUCAP in the last 168 hours.  Radiological Exams on Admission: Dg Chest Port 1 View  Result Date: 11/20/2018 CLINICAL DATA:  85 year old female with history of progressive weakness as well as cough and some chest pain. EXAM: PORTABLE CHEST 1 VIEW  COMPARISON:  Chest x-ray 10/17/2018. FINDINGS: Patchy ill-defined areas of interstitial prominence and airspace disease in the right lung, most evident in the right upper lobe and medial aspect of the right lung base, concerning for multilobar pneumonia. Left lung appears clear. No pleural effusions. No evidence of pulmonary edema. Heart size is normal. Upper mediastinal contours are within normal limits. Aortic atherosclerosis. IMPRESSION: 1. Findings are concerning for developing multilobar bronchopneumonia in the right lung, as above. 2. Aortic atherosclerosis. Electronically Signed   By: Vinnie Langton M.D.   On: 11/20/2018 10:41   Dg Femur Min 2 Views Left  Result Date: 11/20/2018 CLINICAL DATA:  LEFT leg pain.  History of prior fracture and ORIF. EXAM: LEFT FEMUR 2 VIEWS COMPARISON:  None. FINDINGS: Plate/wire/screw fixation of the LEFT femur is again noted. There is SUPERIOR position of the remaining LEFT femur in relation to the acetabulum with no discernible femoral head identified. This is unchanged from 06/15/2017. No new abnormalities or acute fractures noted. Degenerative changes in the knee again identified. IMPRESSION: 1. No acute  abnormality. No interval change from 06/15/2017. LEFT femur ORIF with SUPERIOR position of the LEFT femur in relation to the acetabulum with no discernible femoral head identified. Electronically Signed   By: Margarette Canada M.D.   On: 11/20/2018 12:32   Dg Femur Min 2 Views Right  Result Date: 11/20/2018 CLINICAL DATA:  RIGHT leg pain.  Initial encounter. EXAM: RIGHT FEMUR 2 VIEWS COMPARISON:  None. FINDINGS: No evidence of fracture or dislocation. No focal bony lesions are present. Severe degenerative changes in the knee are noted. IMPRESSION: No acute abnormality Electronically Signed   By: Margarette Canada M.D.   On: 11/20/2018 12:29   Assessment/Plan Principal Problem:   Right lower lobe pneumonia (HCC) Active Problems:   Hypertension   Hyponatremia   Anemia, chronic disease   GERD (gastroesophageal reflux disease)   Does not walk - Wheelchair bound   Generalized weakness  1. Right lower lobe pneumonia- continue IV ceftriaxone and azithromycin and supportive therapy.  Bronchodilators ordered as needed.  Cough syrup ordered as needed.  Supplemental oxygen as needed. 2. Essential hypertension-resume home lisinopril 20 mg daily.  IV hydralazine ordered as needed. 3. Hyponatremia- secondary to dehydration, treating with normal saline infusion, follow BMP in a.m. 4. Anemia of chronic disease-patient is taking iron supplements.  Monitor CBC. 5. GERD-Protonix ordered for GI protection. 6. Generalized weakness and immobility-PT evaluation requested. 7. Chronic leg pain-Tylenol /tramadol ordered as needed for symptoms. 8. Abnormal urinalysis- patient does not seem to be symptomatic at this time will follow.  She is receiving ceftriaxone as part of the treatment for her pneumonia.  Hold trimethoprim.  DVT Prophylaxis: Lovenox Code Status: Full Family Communication:   Disposition Plan: Inpatient for IV antibiotics and IV fluids  Time spent: 56 minutes   Wynetta Emery, MD [facility] How  to contact the [facility] Attending or Consulting provider [place] or covering provider during after hours [place], for this patient?  1. Check the care team in [facility] and look for a) attending/consulting [facility] provider listed and b) the [facility] team listed 2. Log into [email or web address] and use [place]'s universal password to access. If you do not have the password, please contact the hospital operator. 3. Locate the [facility] provider you are looking for under [facility] and page to a number that you can be directly reached. 4. If you still have difficulty reaching the provider, please page the [facility] (Director on Call) for the  Hospitalists listed on amion for assistance.

## 2018-11-20 NOTE — ED Provider Notes (Addendum)
[facility] EMERGENCY DEPARTMENT Provider Note   CSN: [id number] Arrival date & time: 11/20/18  1829    History   Chief Complaint Chief Complaint  Patient presents with  . Weakness    HPI [name] is a 85 y.o. female.     Level 5 caveat for mild dementia.  Patient lives with her children.  She is no longer able to walk.  Chief complaint progressive weakness over the past several weeks.  Decreased appetite and fluid intake.  Questionable cough and chest pain.  She thinks she may be dehydrated.  Review of systems positive for bilateral femur pain; previous left femoral plating procedure     Past Medical History:  Diagnosis Date  . Anemia    takes Ferrous Gluconate daily  . Anxiety   . Arthritis    "hands, feet, bad arthritis all over" (03/15/2017)  . Bell's palsy   . Depression   . Fall 03/15/2017   resulting in left hip dislocation and periprostatic femur fracture./notes 03/15/2017  . Falls frequently    "couple times here recently" (03/15/2017)  . GERD (gastroesophageal reflux disease)   . Heart murmur   . Hypertension    takes Lisinopril and Amlodipine daily  . Left bundle branch block   . Periprosthetic fracture of femur following total replacement of hip    Left  . Pressure injury of sacral region, stage 3 ([place]) 03/21/2017  . Thoracic aortic atherosclerosis ([place]) 03/17/2017  . Type II diabetes mellitus [facility])     Patient Active Problem List   Diagnosis Date Noted  . UTI (urinary tract infection) 09/02/2017  . GERD (gastroesophageal reflux disease) 09/02/2017  . Anxiety with depression 09/02/2017  . Altered mental status 09/02/2017  . Tachycardia 09/02/2017  . Goals of care, counseling/discussion   . Palliative care by specialist   . Chest pain 06/20/2017  . HCAP (healthcare-associated pneumonia) 06/20/2017  . Pressure injury of sacral region, stage 3 ([place]) 03/21/2017  . Dislocated hip, left, initial encounter ([place]) 03/19/2017  . Thoracic aortic  atherosclerosis ([place]) 03/17/2017  . Pressure injury of skin 03/17/2017  . Femur fracture, left ([place]) 06/09/2012  . Acute blood loss anemia 05/24/2012  . Hyponatremia 05/20/2012  . Anemia, chronic disease 05/20/2012  . Left bundle branch block 05/20/2012  . Hip fracture ([place]) 05/19/2012  . Hypertension 05/19/2012    Past Surgical History:  Procedure Laterality Date  . APPENDECTOMY    . FRACTURE SURGERY    . HIP ARTHROPLASTY  05/20/2012   Procedure: ARTHROPLASTY BIPOLAR HIP;  Surgeon: Marybelle Killings, MD;  Location: [place];  Service: Orthopedics;  Laterality: Left;  Left monopolar hemiarthroplasty  . ORIF FEMUR FRACTURE Left 03/17/2017   Procedure: REMOVAL OF HEMIARTHROPLASTY, GIRDLESTONE, OPEN REDUCTION INTERNAL FIXATION OF FEMUR FRACTURE, PLATING OF FEMUR FRACTURE;  Surgeon: Marybelle Killings, MD;  Location: [place];  Service: Orthopedics;  Laterality: Left;  . TOTAL HIP REVISION Left 06/08/2012   Procedure: TOTAL HIP REVISION;  Surgeon: Marybelle Killings, MD;  Location: [place];  Service: Orthopedics;  Laterality: Left;  Revision left hip hemiarthroplasty for periprosthetic fracture  . TUBAL LIGATION       OB History   No obstetric history on file.      Home Medications    Prior to Admission medications   Medication Sig Start Date End Date Taking? Authorizing Provider  acetaminophen (TYLENOL) 325 MG tablet Take 2 tablets (650 mg total) by mouth every 6 (six) hours as needed for mild pain. 03/21/17  Yes Samuella Cota, MD  aspirin 81 MG chewable tablet Chew 81 mg by mouth daily.   Yes [provider]  ferrous sulfate 325 (65 FE) MG tablet Take 325 mg 2 (two) times daily by mouth. 02/02/17  Yes [provider]  KLOR-CON M10 10 MEQ tablet  11/16/18  Yes [provider]  lisinopril (PRINIVIL,ZESTRIL) 20 MG tablet Take 20 mg by mouth daily.   Yes [provider]  pantoprazole (PROTONIX) 40 MG tablet Take 40 mg daily before breakfast by mouth. 02/24/17  Yes  [provider]  trimethoprim (TRIMPEX) 100 MG tablet TAKE 1 TABLET BY MOUTH EVERYDAY AT BEDTIME 09/08/18  Yes [provider]    Family History History reviewed. No pertinent family history.  Social History Social History   Tobacco Use  . Smoking status: Former Smoker    Packs/day: 3.00    Years: 19.00    Pack years: 57.00    Types: Cigarettes    Quit date: 05/20/1975    Years since quitting: 43.5  . Smokeless tobacco: Never Used  Substance Use Topics  . Alcohol use: No  . Drug use: No     Allergies   Patient has no known allergies.   Review of Systems Review of Systems  Unable to perform ROS: Dementia     Physical Exam Updated Vital Signs BP (!) 185/89   Pulse 98   Temp 98.2 F (36.8 C) (Oral)   Resp (!) 29   Ht 5\' 5"  (1.651 m)   Wt 48.1 kg   SpO2 96%   BMI 17.65 kg/m   Physical Exam Vitals signs and nursing note reviewed.  Constitutional:      Appearance: She is well-developed.     Comments: Conversant, no acute distress, frail  HENT:     Head: Normocephalic and atraumatic.  Eyes:     Conjunctiva/sclera: Conjunctivae normal.  Neck:     Musculoskeletal: Neck supple.  Cardiovascular:     Rate and Rhythm: Normal rate and regular rhythm.  Pulmonary:     Effort: Pulmonary effort is normal.     Breath sounds: Normal breath sounds.  Abdominal:     General: Bowel sounds are normal.     Palpations: Abdomen is soft.  Musculoskeletal:     Comments: Patient is unable to walk; ambulates in a wheelchair  Skin:    General: Skin is warm and dry.  Neurological:     Mental Status: She is alert.     Comments: No new neurological deficits; alert  Psychiatric:     Comments: flat affect      ED Treatments / Results  Labs (all labs ordered are listed, but only abnormal results are displayed) Labs Reviewed  COMPREHENSIVE METABOLIC PANEL - Abnormal; Notable for the following components:      Result Value   Sodium 126 (*)    Chloride 93  (*)    Creatinine, Ser 0.36 (*)    Albumin 3.2 (*)    All other components within normal limits  CBC WITH DIFFERENTIAL/PLATELET - Abnormal; Notable for the following components:   RBC 3.79 (*)    Hemoglobin 10.4 (*)    HCT 32.1 (*)    Lymphs Abs 0.6 (*)    All other components within normal limits  LIPASE, BLOOD - Abnormal; Notable for the following components:   Lipase 53 (*)    All other components within normal limits  URINALYSIS, ROUTINE W REFLEX MICROSCOPIC - Abnormal; Notable for the following components:  Color, Urine STRAW (*)    Specific Gravity, Urine 1.002 (*)    Hgb urine dipstick SMALL (*)    Leukocytes,Ua SMALL (*)    Bacteria, UA MANY (*)    All other components within normal limits  SARS CORONAVIRUS 2 (HOSPITAL ORDER, [place] [facility])  TSH  TROPONIN I (HIGH SENSITIVITY)  TROPONIN I (HIGH SENSITIVITY)    EKG None  Radiology Dg Chest Port 1 View  Result Date: 11/20/2018 CLINICAL DATA:  85 year old female with history of progressive weakness as well as cough and some chest pain. EXAM: PORTABLE CHEST 1 VIEW COMPARISON:  Chest x-ray 10/17/2018. FINDINGS: Patchy ill-defined areas of interstitial prominence and airspace disease in the right lung, most evident in the right upper lobe and medial aspect of the right lung base, concerning for multilobar pneumonia. Left lung appears clear. No pleural effusions. No evidence of pulmonary edema. Heart size is normal. Upper mediastinal contours are within normal limits. Aortic atherosclerosis. IMPRESSION: 1. Findings are concerning for developing multilobar bronchopneumonia in the right lung, as above. 2. Aortic atherosclerosis. Electronically Signed   By: Vinnie Langton M.D.   On: 11/20/2018 10:41   Dg Femur Min 2 Views Left  Result Date: 11/20/2018 CLINICAL DATA:  LEFT leg pain.  History of prior fracture and ORIF. EXAM: LEFT FEMUR 2 VIEWS COMPARISON:  None. FINDINGS: Plate/wire/screw fixation of the  LEFT femur is again noted. There is SUPERIOR position of the remaining LEFT femur in relation to the acetabulum with no discernible femoral head identified. This is unchanged from 06/15/2017. No new abnormalities or acute fractures noted. Degenerative changes in the knee again identified. IMPRESSION: 1. No acute abnormality. No interval change from 06/15/2017. LEFT femur ORIF with SUPERIOR position of the LEFT femur in relation to the acetabulum with no discernible femoral head identified. Electronically Signed   By: Margarette Canada M.D.   On: 11/20/2018 12:32   Dg Femur Min 2 Views Right  Result Date: 11/20/2018 CLINICAL DATA:  RIGHT leg pain.  Initial encounter. EXAM: RIGHT FEMUR 2 VIEWS COMPARISON:  None. FINDINGS: No evidence of fracture or dislocation. No focal bony lesions are present. Severe degenerative changes in the knee are noted. IMPRESSION: No acute abnormality Electronically Signed   By: Margarette Canada M.D.   On: 11/20/2018 12:29    Procedures Procedures (including critical care time)  Medications Ordered in ED Medications  azithromycin (ZITHROMAX) 500 mg in sodium chloride 0.9 % 250 mL IVPB (has no administration in time range)  sodium chloride 0.9 % bolus 1,000 mL (0 mLs Intravenous Stopped 11/20/18 1246)  cefTRIAXone (ROCEPHIN) 1 g in sodium chloride 0.9 % 100 mL IVPB (1 g Intravenous New Bag/Given 11/20/18 1239)  fentaNYL (SUBLIMAZE) injection 25 mcg (25 mcg Intravenous Given 11/20/18 1244)     Initial Impression / Assessment and Plan / ED Course  I have reviewed the triage vital signs and the nursing notes.  Pertinent labs & imaging results that were available during my care of the patient were reviewed by me and considered in my medical decision making (see chart for details).       Patient appears to be declining.  She is frail, but no focal anomalies on exam.  Will hydrate, check labs, bilateral femur x-rays.  Chest x-ray reveals multilobar bronchopneumonia in the right lung.   Will Rx IV Rocephin, IV Zithromax, IV fluids, admit to general medicine.  CRITICAL CARE Performed by: Nat Christen Total critical care time: 30 minutes Critical care time  was exclusive of separately billable procedures and treating other patients. Critical care was necessary to treat or prevent imminent or life-threatening deterioration. Critical care was time spent personally by me on the following activities: development of treatment plan with patient and/or surrogate as well as nursing, discussions with consultants, evaluation of patient's response to treatment, examination of patient, obtaining history from patient or surrogate, ordering and performing treatments and interventions, ordering and review of laboratory studies, ordering and review of radiographic studies, pulse oximetry and re-evaluation of patient's condition.  Final Clinical Impressions(s) / ED Diagnoses   Final diagnoses:  Weakness  Community acquired pneumonia of right lung, unspecified part of lung    ED Discharge Orders    None       Nat Christen, MD 11/20/18 1154    Nat Christen, MD 11/20/18 1312

## 2018-11-20 NOTE — ED Triage Notes (Signed)
Pt brought in by her son, who is one of her caretakers. He reports she lives at home with assistance from her 5 children. Has been progressively weaker over the past few weeks with some cough and c/o pain across chest. Has not had much of an appetite and feels she may be dehydrated.

## 2018-11-21 LAB — CBC
HCT: 29.7 % — ABNORMAL LOW (ref 36.0–46.0)
Hemoglobin: 9.4 g/dL — ABNORMAL LOW (ref 12.0–15.0)
MCH: 27.6 pg (ref 26.0–34.0)
MCHC: 31.6 g/dL (ref 30.0–36.0)
MCV: 87.4 fL (ref 80.0–100.0)
Platelets: 297 10*3/uL (ref 150–400)
RBC: 3.4 MIL/uL — ABNORMAL LOW (ref 3.87–5.11)
RDW: 13.1 % (ref 11.5–15.5)
WBC: 4.2 10*3/uL (ref 4.0–10.5)
nRBC: 0 % (ref 0.0–0.2)

## 2018-11-21 LAB — BASIC METABOLIC PANEL
Anion gap: 7 (ref 5–15)
BUN: 9 mg/dL (ref 8–23)
CO2: 25 mmol/L (ref 22–32)
Calcium: 8.7 mg/dL — ABNORMAL LOW (ref 8.9–10.3)
Chloride: 97 mmol/L — ABNORMAL LOW (ref 98–111)
Creatinine, Ser: 0.48 mg/dL (ref 0.44–1.00)
GFR calc Af Amer: >60 mL/min (ref >60)
GFR calc non Af Amer: >60 mL/min (ref >60)
Glucose, Bld: 107 mg/dL — ABNORMAL HIGH (ref 70–99)
Potassium: 3.6 mmol/L (ref 3.5–5.1)
Sodium: 129 mmol/L — ABNORMAL LOW (ref 135–145)

## 2018-11-21 LAB — MAGNESIUM: Magnesium: 1.8 mg/dL (ref 1.7–2.4)

## 2018-11-21 LAB — MRSA PCR SCREENING: MRSA by PCR: POSITIVE — AB

## 2018-11-21 MED ORDER — MUPIROCIN 2 % EX OINT
1.0000 "application " | TOPICAL_OINTMENT | Freq: Two times a day (BID) | CUTANEOUS | Status: AC
  Administered 2018-11-21 – 2018-11-25 (×10): 1 via NASAL
  Filled 2018-11-21: qty 22

## 2018-11-21 MED ORDER — CHLORHEXIDINE GLUCONATE CLOTH 2 % EX PADS
6.0000 | MEDICATED_PAD | Freq: Every day | CUTANEOUS | Status: AC
  Administered 2018-11-21 – 2018-11-25 (×4): 6 via TOPICAL

## 2018-11-21 NOTE — [place] Notes (Signed)
PROGRESS NOTE    [name]  JME:[id number]  DOB: 1933/10/15  DOA: 11/20/2018 PCP: [facility]   Brief Admission Hx: 85 year old female who was brought in from home with progressive weakness, cough and atypical chest pain.   MDM/Assessment & Plan:   1. Right lower lobe pneumonia- continue IV ceftriaxone and azithromycin and supportive therapy.  Pt is clinically looking better today.  Bronchodilators ordered as needed.  Cough syrup ordered as needed.  Supplemental oxygen as needed but has not been required.  2. Essential hypertension-resume home lisinopril 20 mg daily.  IV hydralazine ordered as needed. 3. Hyponatremia- secondary to dehydration, treating with normal saline infusion, follow BMP in a.m.  It is slowly improving.  4. Anemia of chronic disease-patient is taking iron supplements.  Monitor CBC. 5. GERD-Protonix ordered for GI protection. 6. Generalized weakness and immobility-PT evaluation sent that patient had no needs at this time. 7. Chronic leg pain-Tylenol /tramadol ordered as needed for symptoms. 8. Abnormal urinalysis- patient does not seem to be symptomatic at this time will follow.  She is receiving ceftriaxone as part of the treatment for her pneumonia.  Hold trimethoprim but resume at discharge.  DVT Prophylaxis: Lovenox Code Status: Full Family Communication:   Disposition Plan: Inpatient for IV antibiotics and IV fluids  Consultants:  PT  Procedures:    Antimicrobials:  Ceftriaxone/azithromycin 7/26>>  Subjective: Patient says she feels a little better today.  Objective: Vitals:   11/21/18 0632 11/21/18 0743 11/21/18 1333 11/21/18 1335  BP: (!) 124/103  135/66   Pulse: 74  88   Resp: (!) 22  17   Temp: 98.3 F (36.8 C)  98.7 F (37.1 C)   TempSrc: Oral  Oral   SpO2: 95% 91% 93% 92%  Weight:      Height:        Intake/Output Summary (Last 24 hours) at 11/21/2018 1601 Last data filed at 11/21/2018 1300 Gross per 24 hour   Intake 1269.93 ml  Output 102 ml  Net 1167.93 ml   Filed Weights   11/20/18 0950 11/20/18 1555  Weight: 48.1 kg 49.2 kg     REVIEW OF SYSTEMS  As per history otherwise all reviewed and reported negative  Exam:  General exam: Elderly female sitting up in the bed she is awake and alert in no apparent distress. Respiratory system: Rales right lower lobe. No increased work of breathing. Cardiovascular system: S1 & S2 heard. No JVD, murmurs, gallops, clicks or pedal edema. Gastrointestinal system: Abdomen is nondistended, soft and nontender. Normal bowel sounds heard. Central nervous system: Alert and oriented to person. No focal neurological deficits. Extremities: no CCE.  Diffuse osteoarthritic changes.  Data Reviewed: Basic Metabolic Panel: Recent Labs  Lab 11/20/18 1016 11/21/18 0549  NA 126* 129*  K 3.9 3.6  CL 93* 97*  CO2 25 25  GLUCOSE 92 107*  BUN 8 9  CREATININE 0.36* 0.48  CALCIUM 9.1 8.7*  MG  --  1.8   Liver Function Tests: Recent Labs  Lab 11/20/18 1016  AST 27  ALT 18  ALKPHOS 75  BILITOT 0.6  PROT 7.9  ALBUMIN 3.2*   Recent Labs  Lab 11/20/18 1016  LIPASE 53*   No results for input(s): AMMONIA in the last 168 hours. CBC: Recent Labs  Lab 11/20/18 1016 11/21/18 0549  WBC 4.5 4.2  NEUTROABS 2.7  --   HGB 10.4* 9.4*  HCT 32.1* 29.7*  MCV 84.7 87.4  PLT 308 297   Cardiac Enzymes:  No results for input(s): CKTOTAL, CKMB, CKMBINDEX, TROPONINI in the last 168 hours. CBG (last 3)  No results for input(s): GLUCAP in the last 72 hours. Recent Results (from the past 240 hour(s))  SARS Coronavirus 2 (CEPHEID- Performed in [facility] hospital lab), Hosp Order     Status: None   Collection Time: 11/20/18 12:29 PM   Specimen: Nasopharyngeal Swab  Result Value Ref Range Status   SARS Coronavirus 2 NEGATIVE NEGATIVE Final    Comment: (NOTE) If result is NEGATIVE SARS-CoV-2 target nucleic acids are NOT DETECTED. The SARS-CoV-2 RNA is  generally detectable in upper and lower  respiratory specimens during the acute phase of infection. The lowest  concentration of SARS-CoV-2 viral copies this assay can detect is 250  copies / mL. A negative result does not preclude SARS-CoV-2 infection  and should not be used as the sole basis for treatment or other  patient management decisions.  A negative result may occur with  improper specimen collection / handling, submission of specimen other  than nasopharyngeal swab, presence of viral mutation(s) within the  areas targeted by this assay, and inadequate number of viral copies  (<250 copies / mL). A negative result must be combined with clinical  observations, patient history, and epidemiological information. If result is POSITIVE SARS-CoV-2 target nucleic acids are DETECTED. The SARS-CoV-2 RNA is generally detectable in upper and lower  respiratory specimens dur ing the acute phase of infection.  Positive  results are indicative of active infection with SARS-CoV-2.  Clinical  correlation with patient history and other diagnostic information is  necessary to determine patient infection status.  Positive results do  not rule out bacterial infection or co-infection with other viruses. If result is PRESUMPTIVE POSTIVE SARS-CoV-2 nucleic acids MAY BE PRESENT.   A presumptive positive result was obtained on the submitted specimen  and confirmed on repeat testing.  While 2019 novel coronavirus  (SARS-CoV-2) nucleic acids may be present in the submitted sample  additional confirmatory testing may be necessary for epidemiological  and / or clinical management purposes  to differentiate between  SARS-CoV-2 and other Sarbecovirus currently known to infect humans.  If clinically indicated additional testing with an alternate test  methodology [phone]) is advised. The SARS-CoV-2 RNA is generally  detectable in upper and lower respiratory sp ecimens during the acute  phase of  infection. The expected result is Negative. Fact Sheet for Patients:  StrictlyIdeas.no Fact Sheet for Healthcare Providers: BankingDealers.co.za This test is not yet approved or cleared by the [place] FDA and has been authorized for detection and/or diagnosis of SARS-CoV-2 by FDA under an Emergency Use Authorization (EUA).  This EUA will remain in effect (meaning this test can be used) for the duration of the COVID-19 declaration under Section 564(b)(1) of the Act, 21 U.S.C. section 360bbb-3(b)(1), unless the authorization is terminated or revoked sooner. Performed at [facility], [address], [place], [place] [address]   MRSA PCR Screening     Status: Abnormal   Collection Time: 11/21/18  9:56 AM   Specimen: Nasopharyngeal  Result Value Ref Range Status   MRSA by PCR POSITIVE (A) NEGATIVE Final    Comment:        The GeneXpert MRSA Assay (FDA approved for NASAL specimens only), is one component of a comprehensive MRSA colonization surveillance program. It is not intended to diagnose MRSA infection nor to guide or monitor treatment for MRSA infections. RESULT CALLED TO, READ BACK BY AND VERIFIED WITH: BENGTSON P. AT  [id number] ON [id number] BY THOMPSON S. Performed at [facility], [address] [place], [place] [address]      Studies: Dg Chest [facility] 1 View  Result Date: 11/20/2018 CLINICAL DATA:  85 year old female with history of progressive weakness as well as cough and some chest pain. EXAM: PORTABLE CHEST 1 VIEW COMPARISON:  Chest x-ray 10/17/2018. FINDINGS: Patchy ill-defined areas of interstitial prominence and airspace disease in the right lung, most evident in the right upper lobe and medial aspect of the right lung base, concerning for multilobar pneumonia. Left lung appears clear. No pleural effusions. No evidence of pulmonary edema. Heart size is normal. Upper mediastinal contours are within normal limits. Aortic  atherosclerosis. IMPRESSION: 1. Findings are concerning for developing multilobar bronchopneumonia in the right lung, as above. 2. Aortic atherosclerosis. Electronically Signed   By: Vinnie Langton M.D.   On: 11/20/2018 10:41   Dg Femur Min 2 Views Left  Result Date: 11/20/2018 CLINICAL DATA:  LEFT leg pain.  History of prior fracture and ORIF. EXAM: LEFT FEMUR 2 VIEWS COMPARISON:  None. FINDINGS: Plate/wire/screw fixation of the LEFT femur is again noted. There is SUPERIOR position of the remaining LEFT femur in relation to the acetabulum with no discernible femoral head identified. This is unchanged from 06/15/2017. No new abnormalities or acute fractures noted. Degenerative changes in the knee again identified. IMPRESSION: 1. No acute abnormality. No interval change from 06/15/2017. LEFT femur ORIF with SUPERIOR position of the LEFT femur in relation to the acetabulum with no discernible femoral head identified. Electronically Signed   By: Margarette Canada M.D.   On: 11/20/2018 12:32   Dg Femur Min 2 Views Right  Result Date: 11/20/2018 CLINICAL DATA:  RIGHT leg pain.  Initial encounter. EXAM: RIGHT FEMUR 2 VIEWS COMPARISON:  None. FINDINGS: No evidence of fracture or dislocation. No focal bony lesions are present. Severe degenerative changes in the knee are noted. IMPRESSION: No acute abnormality Electronically Signed   By: Margarette Canada M.D.   On: 11/20/2018 12:29     Scheduled Meds:  acetaminophen  650 mg Oral Q6H   aspirin  81 mg Oral Daily   Chlorhexidine Gluconate Cloth  6 each Topical Q0600   enoxaparin (LOVENOX) injection  40 mg Subcutaneous Q24H   feeding supplement (ENSURE ENLIVE)  237 mL Oral BID BM   ferrous sulfate  325 mg Oral Q breakfast   guaiFENesin-dextromethorphan  10 mL Oral Q8H   ipratropium-albuterol  3 mL Nebulization Q8H   lisinopril  20 mg Oral Daily   mouth rinse  15 mL Mouth Rinse BID   mupirocin ointment  1 application Nasal BID   pantoprazole  40 mg Oral  QAC breakfast   Continuous Infusions:  sodium chloride 45 mL/hr at 11/21/18 1430   azithromycin 500 mg (11/21/18 1207)   cefTRIAXone (ROCEPHIN)  IV 2 g (11/21/18 1118)    Principal Problem:   Right lower lobe pneumonia (HCC) Active Problems:   Hypertension   Hyponatremia   Anemia, chronic disease   GERD (gastroesophageal reflux disease)   Does not walk - Wheelchair bound   Generalized weakness   Time spent:   Irwin Brakeman, MD [facility] 11/21/2018, 4:01 PM    LOS: 1 day  How to contact the [facility] Attending or Consulting provider [place] or covering provider during after hours [place], for this patient?  1. Check the care team in [facility] and look for a) attending/consulting TRH provider listed and b) the [facility] team listed  2. Log into [email or web address] and use [place]'s universal password to access. If you do not have the password, please contact the hospital operator. 3. Locate the [facility] provider you are looking for under [facility] and page to a number that you can be directly reached. 4. If you still have difficulty reaching the provider, please page the [facility] (Director on Call) for the Hospitalists listed on amion for assistance.

## 2018-11-21 NOTE — TOC Initial Note (Signed)
Transition of Care [facility]) - Initial/Assessment Note    Patient Details  Name: [name] MRN: [id number] Date of Birth: Sep 04, 1933  Transition of [facility]) CM/SW Contact:    [name], Chauncey [place], RN Phone Number: 11/21/2018, 11:42 AM  Clinical Narrative:  Consulted for home health. PT eval- no needs, ? Maybe lift .   Patient lives at home with family, 24/7 care.  Call to son, [name], patient has no needs. Has all needed DME, including hospital bed, lift and WC. No current home health. No needs communicated. TOC will sign off, please consult again if new needs arise.                 Expected Discharge Plan: Home/Self Care Barriers to Discharge: No Barriers Identified      Expected Discharge Plan and Services Expected Discharge Plan: Home/Self Care                             Prior Living Arrangements/Services   Lives with:: Adult Children          Need for Family Participation in Patient Care: Yes (Comment) Care giver support system in place?: Yes (comment) Current home services: DME(hospital bed, lift, wc) Criminal Activity/Legal Involvement Pertinent to Current Situation/Hospitalization: No - Comment as needed  Activities of Daily Living Home Assistive Devices/Equipment: Wheelchair ADL Screening (condition at time of admission) Patient's cognitive ability adequate to safely complete daily activities?: No Is the patient deaf or have difficulty hearing?: No Does the patient have difficulty seeing, even when wearing glasses/contacts?: Yes Does the patient have difficulty concentrating, remembering, or making decisions?: Yes Patient able to express need for assistance with ADLs?: Yes Does the patient have difficulty dressing or bathing?: Yes Independently performs ADLs?: No Communication: Independent Dressing (OT): Needs assistance Is this a change from baseline?: Pre-admission baseline Grooming: Needs assistance Is this a change from baseline?: Pre-admission  baseline Bathing: Needs assistance Is this a change from baseline?: Pre-admission baseline Toileting: Needs assistance Is this a change from baseline?: Pre-admission baseline In/Out Bed: Dependent Is this a change from baseline?: Pre-admission baseline Walks in Home: Dependent Is this a change from baseline?: Pre-admission baseline Does the patient have difficulty walking or climbing stairs?: Yes Weakness of Legs: Both Weakness of Arms/Hands: Both             Admission diagnosis:  Weakness [R53.1] Community acquired pneumonia of right lung, unspecified part of lung [J18.9] Patient Active Problem List   Diagnosis Date Noted  . Right lower lobe pneumonia ([place]) 11/20/2018  . Does not walk - Wheelchair bound 11/20/2018  . Generalized weakness 11/20/2018  . UTI (urinary tract infection) 09/02/2017  . GERD (gastroesophageal reflux disease) 09/02/2017  . Anxiety with depression 09/02/2017  . Altered mental status 09/02/2017  . Tachycardia 09/02/2017  . Goals of care, counseling/discussion   . Palliative care by specialist   . Chest pain 06/20/2017  . HCAP (healthcare-associated pneumonia) 06/20/2017  . Pressure injury of sacral region, stage 3 ([place]) 03/21/2017  . Dislocated hip, left, initial encounter ([place]) 03/19/2017  . Thoracic aortic atherosclerosis ([place]) 03/17/2017  . Pressure injury of skin 03/17/2017  . Femur fracture, left ([place]) 06/09/2012  . Acute blood loss anemia 05/24/2012  . Hyponatremia 05/20/2012  . Anemia, chronic disease 05/20/2012  . Left bundle branch block 05/20/2012  . Hip fracture ([place]) 05/19/2012  . Hypertension 05/19/2012   PCP:  Med[facility] [place] [facility] Pharmacy:   [id number] [place]  [place] [address] Phone: [phone] Fax: [phone]    Readmission Risk Interventions No flowsheet data found.

## 2018-11-21 NOTE — Evaluation (Signed)
Physical Therapy Evaluation Patient Details Name: Demonica Farrey MRN: [id number] DOB: 1933-11-20 Today's Date: 11/21/2018   History of Present Illness  Rorie Delmore is a 85 y.o. female who was brought in from home by 1 of her 5 children who cares for her reporting that she has been progressively weaker over the past several weeks and she has developed cough and complained of chest pain.  The patient has had poor appetite and not eating and drinking well.  The patient has not had a fever.  Patient has dementia.  The patient has a history of frequent falls but according to family members has been wheelchair-bound and not walking for the past several months.  The patient has a history of frequent UTIs and is on trimethoprim daily for prophylaxis.  The patient has a history of depression and anxiety.  The patient has GERD and hypertension and is followed by primary care for this.  The patient has had hip fractures and repair and left femoral plating.  The patient has chronic femur pain.    Clinical Impression  Patient functioning near baseline for functional mobility and gait, unable to stand using RW due to BLE and RUE weakness, required partial stand pivot to transfer to chair.  Patient tolerated sitting up in chair after therapy - RN notified.  Patient will benefit from continued physical therapy in hospital and recommended venue below to increase strength, balance, endurance for safe ADLs and gait.     Follow Up Recommendations No PT follow up;Supervision for mobility/OOB;Supervision/Assistance - 24 hour    Equipment Recommendations  Other (comment)([place]oyer Lift)    Recommendations for Other Services       Precautions / Restrictions Precautions Precautions: Fall Restrictions Weight Bearing Restrictions: No      Mobility  Bed Mobility Overal bed mobility: Needs Assistance Bed Mobility: Supine to Sit     Supine to sit: Mod assist;Max assist     General bed mobility comments: slow  labored movement  Transfers Overall transfer level: Needs assistance Equipment used: 1 person hand held assist Transfers: Sit to/from Stand;Stand Pivot Transfers Sit to Stand: Total assist Stand pivot transfers: Total assist;Max assist       General transfer comment: patient unable to stand using RW, required partial stand pivot to transfer to chair  Ambulation/Gait                Stairs            Wheelchair Mobility    Modified Rankin (Stroke Patients Only)       Balance Overall balance assessment: Needs assistance Sitting-balance support: Feet supported;No upper extremity supported Sitting balance-Leahy Scale: Fair     Standing balance support: During functional activity;Bilateral upper extremity supported Standing balance-Leahy Scale: Zero Standing balance comment: unable to stand                             Pertinent Vitals/Pain Pain Assessment: No/denies pain    Home Living Family/patient expects to be discharged to:: Private residence Living Arrangements: Children Available Help at Discharge: Family Type of Home: House Home Access: Level entry     Home Layout: One level Home Equipment: Environmental consultant - 2 wheels;Bedside commode;Hospital bed;Shower seat      Prior Function Level of Independence: Needs assistance   Gait / Transfers Assistance Needed: Assisted 1 person transfers, non-ambulatory, uses w/c for mobility  ADL's / Homemaking Assistance Needed: assisted by family  Hand Dominance        Extremity/Trunk Assessment   Upper Extremity Assessment Upper Extremity Assessment: Generalized weakness;RUE deficits/detail;LUE deficits/detail RUE Deficits / Details: grossly -3/5 except wrist/hand grossly 2/5, poor hand grip LUE Deficits / Details: grossly 3+/5    Lower Extremity Assessment Lower Extremity Assessment: Generalized weakness;RLE deficits/detail;LLE deficits/detail RLE Deficits / Details: grossly 2+/5 LLE  Deficits / Details: grossly 2+/5       Communication   Communication: No difficulties  Cognition Arousal/Alertness: Awake/alert Behavior During Therapy: WFL for tasks assessed/performed Overall Cognitive Status: Within Functional Limits for tasks assessed                                        General Comments      Exercises     Assessment/Plan    PT Assessment Patient needs continued PT services  PT Problem List Decreased strength;Decreased activity tolerance;Decreased balance;Decreased mobility       PT Treatment Interventions Functional mobility training;Therapeutic activities;Therapeutic exercise;Wheelchair mobility training    PT Goals (Current goals can be found in the Care Plan section)  Acute Rehab PT Goals Patient Stated Goal: return home with family to assist PT Goal Formulation: With patient Time For Goal Achievement: 11/28/18 Potential to Achieve Goals: Good    Frequency Min 2X/week   Barriers to discharge        Co-evaluation               AM-PAC PT "6 Clicks" Mobility  Outcome Measure Help needed turning from your back to your side while in a flat bed without using bedrails?: A Lot Help needed moving from lying on your back to sitting on the side of a flat bed without using bedrails?: A Lot Help needed moving to and from a bed to a chair (including a wheelchair)?: A Lot Help needed standing up from a chair using your arms (e.g., wheelchair or bedside chair)?: A Lot Help needed to walk in hospital room?: Total Help needed climbing 3-5 steps with a railing? : Total 6 Click Score: 10    End of Session   Activity Tolerance: Patient tolerated treatment well;Patient limited by fatigue Patient left: in chair;with call bell/phone within reach Nurse Communication: Mobility status PT Visit Diagnosis: Unsteadiness on feet (R26.81);Other abnormalities of gait and mobility (R26.89);Muscle weakness (generalized) (M62.81)    Time:  6979-4801 PT Time Calculation (min) (ACUTE ONLY): 30 min   Charges:   PT Evaluation $PT Eval Moderate Complexity: 1 Mod PT Treatments $Therapeutic Activity: 23-37 mins        3:11 PM, 11/21/18 Lonell Grandchild, MPT Physical Therapist with [facility] 336 [phone] office [phone] mobile phone

## 2018-11-21 NOTE — [place] Notes (Signed)
Initial Nutrition Assessment  DOCUMENTATION CODES:   Underweight  INTERVENTION:  Ensure Enlive po BID, each supplement provides 350 kcal and 20 grams of protein    NUTRITION DIAGNOSIS:   Inadequate oral intake related to acute illness(pneumonia) as evidenced by (report of poor appeitte and intake PTA and Labs: clincally dehydrated on admission).   GOAL:   Patient will meet greater than or equal to 90% of their needs  MONITOR:   PO intake, Supplement acceptance, Labs, Weight trends, Skin  REASON FOR ASSESSMENT:   Malnutrition Screening Tool    ASSESSMENT: Patient is an underweight 85 yo female with dementia, Anemia, GERD, Hypertension, Type 2 diabetes and HTN. Patient says she has not walked in approximately 2 years.   Patient says she had not been eating well and drinking as much as usual. Her children help daily with her care but she is able to feed herself. Dehydrated on admission but patient unable to provide detailed nutrition intake history.   Patient weight history is stable the past 15 months between 48-49 kg range. Unable to complete NPFE today pt is up in chair eating her lunch. Patient likely malnourished but unable to clearly define at this moment.   Medications reviewed and include: ferrous sulfate, Protonix, rocephin and zithromax.   Labs: hyponatremia-129 (L) BMP Latest Ref Rng & Units 11/21/2018 11/20/2018 10/07/2017  Glucose 70 - 99 mg/dL 107(H) 92 89  BUN 8 - 23 mg/dL 9 8 8   Creatinine 0.44 - 1.00 mg/dL 0.48 0.36(L) 0.43(L)  Sodium 135 - 145 mmol/L 129(L) 126(L) 132(L)  Potassium 3.5 - 5.1 mmol/L 3.6 3.9 4.5  Chloride 98 - 111 mmol/L 97(L) 93(L) 100(L)  CO2 22 - 32 mmol/L 25 25 24   Calcium 8.9 - 10.3 mg/dL 8.7(L) 9.1 10.0     NUTRITION - FOCUSED PHYSICAL EXAM: Unable to complete Nutrition-Focused physical exam at this time.    Diet Order:   Diet Order            DIET SOFT Room service appropriate? Yes; Fluid consistency: Thin  Diet effective now               EDUCATION NEEDS:  Not appropriate for education at this time    Skin:  Skin Assessment: Reviewed RN Assessment  Last BM:  7/26  Height:   Ht Readings from Last 1 Encounters:  11/20/18 5\' 5"  (1.651 m)    Weight:   Wt Readings from Last 1 Encounters:  11/20/18 49.2 kg    Ideal Body Weight:  57 kg  BMI:  Body mass index is 18.04 kg/m.  Estimated Nutritional Needs:   Kcal:  1470-1617 (30-33 kcal/kg/bw) to prevent wt loss  Protein:  59-64 gr  Fluid:  1500 ml daily   Colman Cater MS,RD,CSG,LDN Office: [phone] Pager: [phone]

## 2018-11-21 NOTE — Plan of Care (Signed)
  Problem: Acute Rehab PT Goals(only PT should resolve) Goal: Pt Will Go Supine/Side To Sit Outcome: Progressing Flowsheets (Taken 11/21/2018 1513) Pt will go Supine/Side to Sit: with moderate assist Goal: Patient Will Perform Sitting Balance Outcome: Progressing Flowsheets (Taken 11/21/2018 1513) Patient will perform sitting balance: with modified independence Goal: Patient Will Transfer Sit To/From Stand Outcome: Progressing Flowsheets (Taken 11/21/2018 1513) Patient will transfer sit to/from stand:  with moderate assist  with maximum assist Goal: Pt Will Transfer Bed To Chair/Chair To Bed Outcome: Progressing Flowsheets (Taken 11/21/2018 1513) Pt will Transfer Bed to Chair/Chair to Bed: with max assist   3:14 PM, 11/21/18 Lonell Grandchild, MPT Physical Therapist with [facility] [phone] office [phone] mobile phone

## 2018-11-22 LAB — BASIC METABOLIC PANEL
Anion gap: 8 (ref 5–15)
BUN: 8 mg/dL (ref 8–23)
CO2: 25 mmol/L (ref 22–32)
Calcium: 8.8 mg/dL — ABNORMAL LOW (ref 8.9–10.3)
Chloride: 97 mmol/L — ABNORMAL LOW (ref 98–111)
Creatinine, Ser: 0.38 mg/dL — ABNORMAL LOW (ref 0.44–1.00)
GFR calc Af Amer: >60 mL/min (ref >60)
GFR calc non Af Amer: >60 mL/min (ref >60)
Glucose, Bld: 98 mg/dL (ref 70–99)
Potassium: 3.6 mmol/L (ref 3.5–5.1)
Sodium: 130 mmol/L — ABNORMAL LOW (ref 135–145)

## 2018-11-22 LAB — MAGNESIUM: Magnesium: 1.8 mg/dL (ref 1.7–2.4)

## 2018-11-22 LAB — HIV ANTIBODY (ROUTINE TESTING W REFLEX): HIV Screen 4th Generation wRfx: NONREACTIVE

## 2018-11-22 MED ORDER — AMLODIPINE BESYLATE 5 MG PO TABS
5.0000 mg | ORAL_TABLET | Freq: Every day | ORAL | Status: DC
  Administered 2018-11-22 – 2018-12-01 (×9): 5 mg via ORAL
  Filled 2018-11-22 (×9): qty 1

## 2018-11-22 MED ORDER — PANTOPRAZOLE SODIUM 40 MG PO TBEC
40.0000 mg | DELAYED_RELEASE_TABLET | Freq: Two times a day (BID) | ORAL | Status: DC
  Administered 2018-11-22 – 2018-12-01 (×14): 40 mg via ORAL
  Filled 2018-11-22 (×15): qty 1

## 2018-11-22 NOTE — Progress Notes (Signed)
PROGRESS NOTE    [name]  FTD:[id number]  DOB: 1933-05-29  DOA: 11/20/2018 PCP: Medicine, [facility]  Brief Admission Hx: 85 year old female who was brought in from home with progressive weakness, cough and atypical chest pain.   MDM/Assessment & Plan:   1. Right lower lobe pneumonia- continue IV ceftriaxone and azithromycin and supportive therapy.  Bronchodilators ordered as needed.  Cough syrup ordered as needed.  Supplemental oxygen as needed but has not been required.  2. Essential hypertension-suboptimally controlled, resumed home lisinopril 20 mg daily and added amlodipine.  IV hydralazine ordered as needed. 3. Hyponatremia- secondary to dehydration, treating with normal saline infusion, follow BMP in a.m.  It is slowly improving.  4. Anemia of chronic disease-patient is taking iron supplements.  Monitor CBC. 5. Chest Pain - her symptoms are thought secondary to uncontrolled acid reflux.  Increased protonix to BID.  6. GERD-Protonix ordered for GI protection.  Increased protonix to BID.  7. Generalized weakness and immobility-PT evaluation sent that patient had no needs at this time. 8. Chronic leg pain-Tylenol /tramadol ordered as needed for symptoms. 9. Abnormal urinalysis- patient does not seem to be symptomatic at this time will follow.  She is receiving ceftriaxone as part of the treatment for her pneumonia.  Hold trimethoprim but resume at discharge.  DVT Prophylaxis: Lovenox Code Status: Full Family Communication:  I spoke with son Michael/telephone 7/27, 7/28 Disposition Plan: Inpatient for IV antibiotics and IV fluids, [name] will take patient home for 24/7 care from family members when medically ready  Consultants:  PT  Procedures:    Antimicrobials:  Ceftriaxone/azithromycin 7/26>>  Subjective: Patient having burning chest and stomach pain after eating.  Pt has no SOB.  Pt denies palpitations.   Objective: Vitals:   11/21/18 1943 11/21/18  2115 11/22/18 0521 11/22/18 1413  BP:  (!) 158/86 (!) 164/78 (!) 144/71  Pulse:  86 77 83  Resp:  16 16 16   Temp:  98.1 F (36.7 C) 97.8 F (36.6 C) 98.6 F (37 C)  TempSrc:  Oral Oral Oral  SpO2: 93% 97% 93% 97%  Weight:      Height:        Intake/Output Summary (Last 24 hours) at 11/22/2018 1434 Last data filed at 11/22/2018 1300 Gross per 24 hour  Intake 1880.94 ml  Output 2076 ml  Net -195.06 ml   Filed Weights   11/20/18 0950 11/20/18 1555  Weight: 48.1 kg 49.2 kg     REVIEW OF SYSTEMS  As per history otherwise all reviewed and reported negative  Exam:  General exam: Elderly female sitting up in the bed she is awake and alert in no apparent distress. Respiratory system: Rales right lower lobe. No increased work of breathing. Cardiovascular system: S1 & S2 heard. No JVD, murmurs, gallops, clicks or pedal edema. Gastrointestinal system: Abdomen is nondistended, soft and nontender. Normal bowel sounds heard. Central nervous system: Alert and oriented to person. No focal neurological deficits. Extremities: no CCE.  Diffuse osteoarthritic changes.  Data Reviewed: Basic Metabolic Panel: Recent Labs  Lab 11/20/18 1016 11/21/18 0549 11/22/18 0455  NA 126* 129* 130*  K 3.9 3.6 3.6  CL 93* 97* 97*  CO2 25 25 25   GLUCOSE 92 107* 98  BUN 8 9 8   CREATININE 0.36* 0.48 0.38*  CALCIUM 9.1 8.7* 8.8*  MG  --  1.8 1.8   Liver Function Tests: Recent Labs  Lab 11/20/18 1016  AST 27  ALT 18  ALKPHOS 75  BILITOT 0.6  PROT 7.9  ALBUMIN 3.2*   Recent Labs  Lab 11/20/18 1016  LIPASE 53*   No results for input(s): AMMONIA in the last 168 hours. CBC: Recent Labs  Lab 11/20/18 1016 11/21/18 0549  WBC 4.5 4.2  NEUTROABS 2.7  --   HGB 10.4* 9.4*  HCT 32.1* 29.7*  MCV 84.7 87.4  PLT 308 297   Cardiac Enzymes: No results for input(s): CKTOTAL, CKMB, CKMBINDEX, TROPONINI in the last 168 hours. CBG (last 3)  No results for input(s): GLUCAP in the last 72  hours. Recent Results (from the past 240 hour(s))  SARS Coronavirus 2 (CEPHEID- Performed in [place] hospital lab), Hosp Order     Status: None   Collection Time: 11/20/18 12:29 PM   Specimen: Nasopharyngeal Swab  Result Value Ref Range Status   SARS Coronavirus 2 NEGATIVE NEGATIVE Final    Comment: (NOTE) If result is NEGATIVE SARS-CoV-2 target nucleic acids are NOT DETECTED. The SARS-CoV-2 RNA is generally detectable in upper and lower  respiratory specimens during the acute phase of infection. The lowest  concentration of SARS-CoV-2 viral copies this assay can detect is 250  copies / mL. A negative result does not preclude SARS-CoV-2 infection  and should not be used as the sole basis for treatment or other  patient management decisions.  A negative result may occur with  improper specimen collection / handling, submission of specimen other  than nasopharyngeal swab, presence of viral mutation(s) within the  areas targeted by this assay, and inadequate number of viral copies  (<250 copies / mL). A negative result must be combined with clinical  observations, patient history, and epidemiological information. If result is POSITIVE SARS-CoV-2 target nucleic acids are DETECTED. The SARS-CoV-2 RNA is generally detectable in upper and lower  respiratory specimens dur ing the acute phase of infection.  Positive  results are indicative of active infection with SARS-CoV-2.  Clinical  correlation with patient history and other diagnostic information is  necessary to determine patient infection status.  Positive results do  not rule out bacterial infection or co-infection with other viruses. If result is PRESUMPTIVE POSTIVE SARS-CoV-2 nucleic acids MAY BE PRESENT.   A presumptive positive result was obtained on the submitted specimen  and confirmed on repeat testing.  While 2019 novel coronavirus  (SARS-CoV-2) nucleic acids may be present in the submitted sample  additional  confirmatory testing may be necessary for epidemiological  and / or clinical management purposes  to differentiate between  SARS-CoV-2 and other Sarbecovirus currently known to infect humans.  If clinically indicated additional testing with an alternate test  methodology [phone]) is advised. The SARS-CoV-2 RNA is generally  detectable in upper and lower respiratory sp ecimens during the acute  phase of infection. The expected result is Negative. Fact Sheet for Patients:  StrictlyIdeas.no Fact Sheet for Healthcare Providers: BankingDealers.co.za This test is not yet approved or cleared by the [place] FDA and has been authorized for detection and/or diagnosis of SARS-CoV-2 by FDA under an Emergency Use Authorization (EUA).  This EUA will remain in effect (meaning this test can be used) for the duration of the COVID-19 declaration under Section 564(b)(1) of the Act, 21 U.S.C. section 360bbb-3(b)(1), unless the authorization is terminated or revoked sooner. Performed at [facility], [address] [place], [place] [address]   MRSA PCR Screening     Status: Abnormal   Collection Time: 11/21/18  9:56 AM   Specimen: Nasopharyngeal  Result Value Ref Range Status  MRSA by PCR POSITIVE (A) NEGATIVE Final    Comment:        The GeneXpert MRSA Assay (FDA approved for NASAL specimens only), is one component of a comprehensive MRSA colonization surveillance program. It is not intended to diagnose MRSA infection nor to guide or monitor treatment for MRSA infections. RESULT CALLED TO, READ BACK BY AND VERIFIED WITH: BENGTSON P. AT 1115A ON [id number] BY THOMPSON S. Performed at [facility], [address], [place], [place] [address]      Studies: No results found.   Scheduled Meds: . acetaminophen  650 mg Oral Q6H  . amLODipine  5 mg Oral Daily  . aspirin  81 mg Oral Daily  . Chlorhexidine Gluconate Cloth  6 each Topical Q0600  .  enoxaparin (LOVENOX) injection  40 mg Subcutaneous Q24H  . feeding supplement (ENSURE ENLIVE)  237 mL Oral BID BM  . ferrous sulfate  325 mg Oral Q breakfast  . guaiFENesin-dextromethorphan  10 mL Oral Q8H  . lisinopril  20 mg Oral Daily  . mouth rinse  15 mL Mouth Rinse BID  . mupirocin ointment  1 application Nasal BID  . pantoprazole  40 mg Oral BID AC   Continuous Infusions: . sodium chloride 35 mL/hr at 11/22/18 1257  . azithromycin 500 mg (11/22/18 1343)  . cefTRIAXone (ROCEPHIN)  IV 2 g (11/22/18 1300)    Principal Problem:   Right lower lobe pneumonia (HCC) Active Problems:   Hypertension   Hyponatremia   Anemia, chronic disease   GERD (gastroesophageal reflux disease)   Does not walk - Wheelchair bound   Generalized weakness  Time spent:   Irwin Brakeman, MD [facility] 11/22/2018, 2:34 PM    LOS: 2 days  How to contact the [facility] Attending or Consulting provider [place] or covering provider during after hours [place], for this patient?  1. Check the care team in [facility] and look for a) attending/consulting [facility] provider listed and b) the [facility] team listed 2. Log into [email or web address] and use [place]'s universal password to access. If you do not have the password, please contact the hospital operator. 3. Locate the [facility] provider you are looking for under [facility] and page to a number that you can be directly reached. 4. If you still have difficulty reaching the provider, please page the [facility] (Director on Call) for the Hospitalists listed on amion for assistance.

## 2018-11-23 LAB — BASIC METABOLIC PANEL
Anion gap: 9 (ref 5–15)
BUN: 6 mg/dL — ABNORMAL LOW (ref 8–23)
CO2: 25 mmol/L (ref 22–32)
Calcium: 8.9 mg/dL (ref 8.9–10.3)
Chloride: 93 mmol/L — ABNORMAL LOW (ref 98–111)
Creatinine, Ser: 0.33 mg/dL — ABNORMAL LOW (ref 0.44–1.00)
GFR calc Af Amer: >60 mL/min (ref >60)
GFR calc non Af Amer: >60 mL/min (ref >60)
Glucose, Bld: 94 mg/dL (ref 70–99)
Potassium: 3.5 mmol/L (ref 3.5–5.1)
Sodium: 127 mmol/L — ABNORMAL LOW (ref 135–145)

## 2018-11-23 LAB — PROCALCITONIN: Procalcitonin: 12.01 ng/mL

## 2018-11-23 MED ORDER — AZITHROMYCIN 250 MG PO TABS
500.0000 mg | ORAL_TABLET | Freq: Every day | ORAL | Status: AC
  Administered 2018-11-23 – 2018-11-24 (×2): 500 mg via ORAL
  Filled 2018-11-23 (×2): qty 2

## 2018-11-23 MED ORDER — IPRATROPIUM-ALBUTEROL 0.5-2.5 (3) MG/3ML IN SOLN
3.0000 mL | Freq: Four times a day (QID) | RESPIRATORY_TRACT | Status: DC
  Administered 2018-11-23 (×2): 3 mL via RESPIRATORY_TRACT
  Filled 2018-11-23 (×2): qty 3

## 2018-11-23 MED ORDER — IPRATROPIUM-ALBUTEROL 0.5-2.5 (3) MG/3ML IN SOLN: 3.0000 mL | RESPIRATORY_TRACT | Status: DC | PRN

## 2018-11-23 MED ORDER — IPRATROPIUM-ALBUTEROL 0.5-2.5 (3) MG/3ML IN SOLN
3.0000 mL | Freq: Three times a day (TID) | RESPIRATORY_TRACT | Status: DC
  Administered 2018-11-24 – 2018-11-25 (×4): 3 mL via RESPIRATORY_TRACT
  Filled 2018-11-23 (×4): qty 3

## 2018-11-23 NOTE — Progress Notes (Signed)
PROGRESS NOTE    [name]  NKN:[id number] DOB: 08-12-1933 DOA: 11/20/2018 PCP: Medicine, [facility]   Brief Narrative:  85 year old with a history of GERD, essential hypertension, anemia of chronic disease admitted to the hospital for cough and atypical chest pain.  Patient was diagnosed with right lower lobe pneumonia started on IV Rocephin and azithromycin.  Along with bronchodilators and supplemental oxygen.  During the hospitalization she was also hyponatremic receiving IV fluids.   Assessment & Plan:   Principal Problem:   Right lower lobe pneumonia (HCC) Active Problems:   Hypertension   Hyponatremia   Anemia, chronic disease   GERD (gastroesophageal reflux disease)   Does not walk - Wheelchair bound   Generalized weakness  Right lower lobe pneumonia - Appears to be doing better on IV Rocephin and azithromycin.  Complete 5-day course. -Bronchodilators scheduled as needed. -Incentive spirometer and flutter valve. -Supplemental oxygen as needed. -We will check procalcitonin levels.  Hyponatremia -Secondary to dehydration.  Lower than yesterday.  Today's 127. - We will place patient on normal saline 75 cc/h.  Anemia of chronic disease -On iron supplements.  Chronic leg pain -On tramadol and Tylenol.  GERD -PPI  Abnormal UA -Does not appear to be symptomatic.  Questionable UTI.  Already on Rocephin.   DVT prophylaxis: Lovenox Code Status: Full code Family Communication: None at bedside Disposition Plan: Maintain hospital stay for at least 24 hours until sodium levels are better.  Signs of dehydration she is tolerating oral she will need IV fluids.  Inpatient appropriate for at least 24 hours.  Consultants:   None  Procedures:   None  Antimicrobials:   Rocephin  Azithromycin   Subjective: Feels okay but does have some exertional shortness of breath.  Mouth slightly feels dry but overall much better than the time when she came in.  Review  of Systems Otherwise negative except as per HPI, including: General: Denies fever, chills, night sweats or unintended weight loss. Resp: Denies cough, wheezing, shortness of breath. Cardiac: Denies chest pain, palpitations, orthopnea, paroxysmal nocturnal dyspnea. GI: Denies abdominal pain, nausea, vomiting, diarrhea or constipation GU: Denies dysuria, frequency, hesitancy or incontinence MS: Denies muscle aches, joint pain or swelling Neuro: Denies headache, neurologic deficits (focal weakness, numbness, tingling), abnormal gait Psych: Denies anxiety, depression, SI/HI/AVH Skin: Denies new rashes or lesions ID: Denies sick contacts, exotic exposures, travel  Objective: Vitals:   11/22/18 1413 11/22/18 1938 11/22/18 2119 11/23/18 0509  BP: (!) 144/71  (!) 147/81 (!) 164/85  Pulse: 83  77 86  Resp: 16  17 16   Temp: 98.6 F (37 C)  97.7 F (36.5 C) 98 F (36.7 C)  TempSrc: Oral  Oral Oral  SpO2: 97% 92% 91% 95%  Weight:      Height:        Intake/Output Summary (Last 24 hours) at 11/23/2018 1240 Last data filed at 11/23/2018 0500 Gross per 24 hour  Intake 240 ml  Output 1900 ml  Net -1660 ml   Filed Weights   11/20/18 0950 11/20/18 1555  Weight: 48.1 kg 49.2 kg    Examination:  General exam: Appears calm and comfortable  Respiratory system: Bilateral rhonchi at the bases Cardiovascular system: S1 & S2 heard, RRR. No JVD, murmurs, rubs, gallops or clicks. No pedal edema. Gastrointestinal system: Abdomen is nondistended, soft and nontender. No organomegaly or masses felt. Normal bowel sounds heard. Central nervous system: Alert and oriented. No focal neurological deficits. Extremities: Symmetric 5 x 5 power. Skin: No rashes,  lesions or ulcers Psychiatry: Judgement and insight appear normal. Mood & affect appropriate.     Data Reviewed:   CBC: Recent Labs  Lab 11/20/18 1016 11/21/18 0549  WBC 4.5 4.2  NEUTROABS 2.7  --   HGB 10.4* 9.4*  HCT 32.1* 29.7*  MCV  84.7 87.4  PLT 308 546   Basic Metabolic Panel: Recent Labs  Lab 11/20/18 1016 11/21/18 0549 11/22/18 0455 11/23/18 0505  NA 126* 129* 130* 127*  K 3.9 3.6 3.6 3.5  CL 93* 97* 97* 93*  CO2 25 25 25 25   GLUCOSE 92 107* 98 94  BUN 8 9 8  6*  CREATININE 0.36* 0.48 0.38* 0.33*  CALCIUM 9.1 8.7* 8.8* 8.9  MG  --  1.8 1.8  --    GFR: Estimated Creatinine Clearance: 41.4 mL/min (A) (by C-G formula based on SCr of 0.33 mg/dL (L)). Liver Function Tests: Recent Labs  Lab 11/20/18 1016  AST 27  ALT 18  ALKPHOS 75  BILITOT 0.6  PROT 7.9  ALBUMIN 3.2*   Recent Labs  Lab 11/20/18 1016  LIPASE 53*   No results for input(s): AMMONIA in the last 168 hours. Coagulation Profile: No results for input(s): INR, PROTIME in the last 168 hours. Cardiac Enzymes: No results for input(s): CKTOTAL, CKMB, CKMBINDEX, TROPONINI in the last 168 hours. BNP (last 3 results) No results for input(s): PROBNP in the last 8760 hours. HbA1C: No results for input(s): HGBA1C in the last 72 hours. CBG: No results for input(s): GLUCAP in the last 168 hours. Lipid Profile: No results for input(s): CHOL, HDL, LDLCALC, TRIG, CHOLHDL, LDLDIRECT in the last 72 hours. Thyroid Function Tests: No results for input(s): TSH, T4TOTAL, FREET4, T3FREE, THYROIDAB in the last 72 hours. Anemia Panel: No results for input(s): VITAMINB12, FOLATE, FERRITIN, TIBC, IRON, RETICCTPCT in the last 72 hours. Sepsis Labs: No results for input(s): PROCALCITON, LATICACIDVEN in the last 168 hours.  Recent Results (from the past 240 hour(s))  SARS Coronavirus 2 (CEPHEID- Performed in [place] hospital lab), Hosp Order     Status: None   Collection Time: 11/20/18 12:29 PM   Specimen: Nasopharyngeal Swab  Result Value Ref Range Status   SARS Coronavirus 2 NEGATIVE NEGATIVE Final    Comment: (NOTE) If result is NEGATIVE SARS-CoV-2 target nucleic acids are NOT DETECTED. The SARS-CoV-2 RNA is generally detectable in upper and  lower  respiratory specimens during the acute phase of infection. The lowest  concentration of SARS-CoV-2 viral copies this assay can detect is 250  copies / mL. A negative result does not preclude SARS-CoV-2 infection  and should not be used as the sole basis for treatment or other  patient management decisions.  A negative result may occur with  improper specimen collection / handling, submission of specimen other  than nasopharyngeal swab, presence of viral mutation(s) within the  areas targeted by this assay, and inadequate number of viral copies  (<250 copies / mL). A negative result must be combined with clinical  observations, patient history, and epidemiological information. If result is POSITIVE SARS-CoV-2 target nucleic acids are DETECTED. The SARS-CoV-2 RNA is generally detectable in upper and lower  respiratory specimens dur ing the acute phase of infection.  Positive  results are indicative of active infection with SARS-CoV-2.  Clinical  correlation with patient history and other diagnostic information is  necessary to determine patient infection status.  Positive results do  not rule out bacterial infection or co-infection with other viruses. If result is PRESUMPTIVE  POSTIVE SARS-CoV-2 nucleic acids MAY BE PRESENT.   A presumptive positive result was obtained on the submitted specimen  and confirmed on repeat testing.  While 2019 novel coronavirus  (SARS-CoV-2) nucleic acids may be present in the submitted sample  additional confirmatory testing may be necessary for epidemiological  and / or clinical management purposes  to differentiate between  SARS-CoV-2 and other Sarbecovirus currently known to infect humans.  If clinically indicated additional testing with an alternate test  methodology [phone]) is advised. The SARS-CoV-2 RNA is generally  detectable in upper and lower respiratory sp ecimens during the acute  phase of infection. The expected result is Negative.  Fact Sheet for Patients:  StrictlyIdeas.no Fact Sheet for Healthcare Providers: BankingDealers.co.za This test is not yet approved or cleared by the [place] FDA and has been authorized for detection and/or diagnosis of SARS-CoV-2 by FDA under an Emergency Use Authorization (EUA).  This EUA will remain in effect (meaning this test can be used) for the duration of the COVID-19 declaration under Section 564(b)(1) of the Act, 21 U.S.C. section 360bbb-3(b)(1), unless the authorization is terminated or revoked sooner. Performed at [facility], [address] [place], [place] [address]   MRSA PCR Screening     Status: Abnormal   Collection Time: 11/21/18  9:56 AM   Specimen: Nasopharyngeal  Result Value Ref Range Status   MRSA by PCR POSITIVE (A) NEGATIVE Final    Comment:        The GeneXpert MRSA Assay (FDA approved for NASAL specimens only), is one component of a comprehensive MRSA colonization surveillance program. It is not intended to diagnose MRSA infection nor to guide or monitor treatment for MRSA infections. RESULT CALLED TO, READ BACK BY AND VERIFIED WITH: BENGTSON P. AT 1115A ON [id number] BY THOMPSON S. Performed at [facility], [address], [place], [place] [address]          Radiology Studies: No results found.      Scheduled Meds: . acetaminophen  650 mg Oral Q6H  . amLODipine  5 mg Oral Daily  . aspirin  81 mg Oral Daily  . azithromycin  500 mg Oral Daily  . Chlorhexidine Gluconate Cloth  6 each Topical Q0600  . enoxaparin (LOVENOX) injection  40 mg Subcutaneous Q24H  . feeding supplement (ENSURE ENLIVE)  237 mL Oral BID BM  . ferrous sulfate  325 mg Oral Q breakfast  . guaiFENesin-dextromethorphan  10 mL Oral Q8H  . ipratropium-albuterol  3 mL Nebulization Q6H  . lisinopril  20 mg Oral Daily  . mouth rinse  15 mL Mouth Rinse BID  . mupirocin ointment  1 application Nasal BID  . pantoprazole  40  mg Oral BID AC   Continuous Infusions: . sodium chloride 35 mL/hr at 11/22/18 1257  . cefTRIAXone (ROCEPHIN)  IV 2 g (11/23/18 1227)     LOS: 3 days   Time spent= 25 mins    Ankit Arsenio Loader, MD [facility]  If 7PM-7AM, please contact night-coverage [email or web address] 11/23/2018, 12:40 PM

## 2018-11-24 LAB — BASIC METABOLIC PANEL
Anion gap: 10 (ref 5–15)
Anion gap: 9 (ref 5–15)
Anion gap: 8 (ref 5–15)
BUN: 6 mg/dL — ABNORMAL LOW (ref 8–23)
BUN: 7 mg/dL — ABNORMAL LOW (ref 8–23)
BUN: 12 mg/dL (ref 8–23)
CO2: 23 mmol/L (ref 22–32)
CO2: 23 mmol/L (ref 22–32)
CO2: 22 mmol/L (ref 22–32)
Calcium: 8.9 mg/dL (ref 8.9–10.3)
Calcium: 8.7 mg/dL — ABNORMAL LOW (ref 8.9–10.3)
Calcium: 8.3 mg/dL — ABNORMAL LOW (ref 8.9–10.3)
Chloride: 87 mmol/L — ABNORMAL LOW (ref 98–111)
Chloride: 84 mmol/L — ABNORMAL LOW (ref 98–111)
Chloride: 86 mmol/L — ABNORMAL LOW (ref 98–111)
Creatinine, Ser: 0.32 mg/dL — ABNORMAL LOW (ref 0.44–1.00)
Creatinine, Ser: 0.32 mg/dL — ABNORMAL LOW (ref 0.44–1.00)
Creatinine, Ser: 0.42 mg/dL — ABNORMAL LOW (ref 0.44–1.00)
GFR calc Af Amer: >60 mL/min (ref >60)
GFR calc Af Amer: >60 mL/min (ref >60)
GFR calc Af Amer: >60 mL/min (ref >60)
GFR calc non Af Amer: >60 mL/min (ref >60)
GFR calc non Af Amer: >60 mL/min (ref >60)
GFR calc non Af Amer: >60 mL/min (ref >60)
Glucose, Bld: 99 mg/dL (ref 70–99)
Glucose, Bld: 138 mg/dL — ABNORMAL HIGH (ref 70–99)
Glucose, Bld: 131 mg/dL — ABNORMAL HIGH (ref 70–99)
Potassium: 3.7 mmol/L (ref 3.5–5.1)
Potassium: 3.3 mmol/L — ABNORMAL LOW (ref 3.5–5.1)
Potassium: 3.4 mmol/L — ABNORMAL LOW (ref 3.5–5.1)
Sodium: 120 mmol/L — ABNORMAL LOW (ref 135–145)
Sodium: 116 mmol/L — CL (ref 135–145)
Sodium: 116 mmol/L — CL (ref 135–145)

## 2018-11-24 LAB — NA AND K (SODIUM & POTASSIUM), RAND UR
Potassium Urine: 28 mmol/L
Sodium, Ur: 198 mmol/L

## 2018-11-24 LAB — CBC
HCT: 34.2 % — ABNORMAL LOW (ref 36.0–46.0)
Hemoglobin: 11.1 g/dL — ABNORMAL LOW (ref 12.0–15.0)
MCH: 27.1 pg (ref 26.0–34.0)
MCHC: 32.5 g/dL (ref 30.0–36.0)
MCV: 83.4 fL (ref 80.0–100.0)
Platelets: 325 10*3/uL (ref 150–400)
RBC: 4.1 MIL/uL (ref 3.87–5.11)
RDW: 12.5 % (ref 11.5–15.5)
WBC: 5.5 10*3/uL (ref 4.0–10.5)
nRBC: 0 % (ref 0.0–0.2)

## 2018-11-24 LAB — STREP PNEUMONIAE URINARY ANTIGEN: Strep Pneumo Urinary Antigen: NEGATIVE

## 2018-11-24 LAB — MAGNESIUM: Magnesium: 1.8 mg/dL (ref 1.7–2.4)

## 2018-11-24 LAB — BRAIN NATRIURETIC PEPTIDE: B Natriuretic Peptide: 242 pg/mL — ABNORMAL HIGH (ref 0.0–100.0)

## 2018-11-24 LAB — OSMOLALITY: Osmolality: 252 mOsm/kg — ABNORMAL LOW (ref 275–295)

## 2018-11-24 LAB — OSMOLALITY, URINE: Osmolality, Ur: 499 mOsm/kg (ref 300–900)

## 2018-11-24 MED ORDER — SODIUM CHLORIDE 1 G PO TABS
1.0000 g | ORAL_TABLET | Freq: Three times a day (TID) | ORAL | Status: DC
  Administered 2018-11-24 – 2018-11-28 (×13): 1 g via ORAL
  Filled 2018-11-24 (×13): qty 1

## 2018-11-24 NOTE — Progress Notes (Signed)
Physical Therapy Treatment Patient Details Name: [name] MRN: [id number] DOB: April 24, 1934 Today's Date: 11/24/2018    History of Present Illness [name] is a 85 y.o. female who was brought in from home by 1 of her 5 children who cares for her reporting that she has been progressively weaker over the past several weeks and she has developed cough and complained of chest pain.  The patient has had poor appetite and not eating and drinking well.  The patient has not had a fever.  Patient has dementia.  The patient has a history of frequent falls but according to family members has been wheelchair-bound and not walking for the past several months.  The patient has a history of frequent UTIs and is on trimethoprim daily for prophylaxis.  The patient has a history of depression and anxiety.  The patient has GERD and hypertension and is followed by primary care for this.  The patient has had hip fractures and repair and left femoral plating.  The patient has chronic femur pain.    PT Comments    Pt states that she was able to get herself into her wheelchair until she got ill.  Now she is unable to do so.  Pt may benefit from skilled home health or SNF to assist in improving her mobility   Follow Up Recommendations  Supervision for mobility/OOB;Supervision/Assistance - 24 hour;Home health PT;SNF     Equipment Recommendations  None recommended by PT    Recommendations for Other Services  OT     Precautions / Restrictions Precautions Precautions: Fall Restrictions Weight Bearing Restrictions: No    Mobility  Bed Mobility Overal bed mobility: Needs Assistance Bed Mobility: Supine to Sit Rolling: Min assist   Supine to sit: Mod assist        Transfers Overall transfer level: Needs assistance Equipment used: 1 person hand held assist Transfers: Sit to/from [employer] Transfers Sit to Stand: Total assist Stand pivot transfers: Total assist;Max assist       General  transfer comment: patient unable to stand using RW, required partial stand pivot to transfer to chair  Ambulation/Gait  N/A unable to walk since 2018               Stairs             Wheelchair Mobility    Modified Rankin (Stroke Patients Only)       Balance Overall balance assessment: Needs assistance Sitting-balance support: Bilateral upper extremity supported Sitting balance-Leahy Scale: Fair                                      Cognition Arousal/Alertness: Awake/alert Behavior During Therapy: WFL for tasks assessed/performed Overall Cognitive Status: Within Functional Limits for tasks assessed                                        Exercises General Exercises - Upper Extremity Elbow Flexion: AROM;10 reps;Both Elbow Extension: Both;10 reps Wrist Flexion: 10 reps;AROM;Both Wrist Extension: 10 reps;Both Digit Composite Flexion: 10 reps;Both Composite Extension: 10 reps;Both General Exercises - Lower Extremity Ankle Circles/Pumps: AROM;10 reps;Both Quad Sets: Both;10 reps Gluteal Sets: Both;10 reps Long Arc Quad: Both;5 reps Heel Slides: AAROM;Both;10 reps Hip ABduction/ADduction: Supine;Both;AAROM;10 reps    General Comments        Pertinent  Vitals/Pain Pain Assessment: No/denies pain    Home Living                      Prior Function   PT able to get to the wheelchair by herself          PT Goals (current goals can now be found in the care plan section) Progress towards PT goals: Progressing toward goals    Frequency    Min 2X/week      PT Plan  continue with strengthening of UE and LE        AM-PAC PT "6 Clicks" Mobility   Outcome Measure  Help needed turning from your back to your side while in a flat bed without using bedrails?: A Lot Help needed moving from lying on your back to sitting on the side of a flat bed without using bedrails?: A Lot Help needed moving to and from a bed to  a chair (including a wheelchair)?: A Lot Help needed standing up from a chair using your arms (e.g., wheelchair or bedside chair)?: A Lot Help needed to walk in hospital room?: Total Help needed climbing 3-5 steps with a railing? : Total 6 Click Score: 10    End of Session Equipment Utilized During Treatment: Gait belt Activity Tolerance: Patient tolerated treatment well;Patient limited by fatigue Patient left: in chair;with call bell/phone within reach Nurse Communication: Mobility status PT Visit Diagnosis: Unsteadiness on feet (R26.81);Other abnormalities of gait and mobility (R26.89);Muscle weakness (generalized) (M62.81)     Time: 1962-2297 PT Time Calculation (min) (ACUTE ONLY): 26 min  Charges:  $Therapeutic Exercise: 8-22 mins $Therapeutic Activity: 8-22 mins                        Rayetta Humphrey, PT CLT [phone] 11/24/2018, 11:32 AM

## 2018-11-24 NOTE — Progress Notes (Signed)
PROGRESS NOTE    [name]  KGU:[id number] DOB: 31-May-1933 DOA: 11/20/2018 PCP: [facility]   Brief Narrative:  85 year old with a history of GERD, essential hypertension, anemia of chronic disease admitted to the hospital for cough and atypical chest pain.  Patient was diagnosed with right lower lobe pneumonia started on IV Rocephin and azithromycin.  Along with bronchodilators and supplemental oxygen.  Patient was initially hyponatremic with signs of dehydration therefore received IV fluids.   Assessment & Plan:   Principal Problem:   Right lower lobe pneumonia (HCC) Active Problems:   Hypertension   Hyponatremia   Anemia, chronic disease   GERD (gastroesophageal reflux disease)   Does not walk - Wheelchair bound   Generalized weakness  Right lower lobe pneumonia - Appears to be doing better on IV Rocephin and azithromycin.  Complete 5-day course. -Bronchodilators scheduled as needed. -Incentive spirometer and flutter valve. -Supplemental oxygen as needed. -We will check procalcitonin levels.  Hyponatremia  -Worsening of hyponatremia.  This morning sodium is 120.  Urine electrolytes ordered.  Serum calcium and urine also been ordered.  BMP every 6 hours..  Anemia of chronic disease -On iron supplements.  Chronic leg pain -On tramadol and Tylenol.  GERD -PPI  Abnormal UA -Does not appear to be symptomatic.  Questionable UTI.  Already on Rocephin.   DVT prophylaxis: Lovenox Code Status: Full code Family Communication: None at bedside Disposition Plan: Maintain hospital stay until sodium levels are better.  She is inpatient appropriate, at a high risk for seizures if sodium level continues to drop.  Consultants:   None  Procedures:   None  Antimicrobials:   Rocephin  Azithromycin   Subjective: Patient states she feels overall okay and thinks her sodium levels have dropped in the past.  She knows the consequences of these are seizures.   She still insists on going home, but she understands after my prolonged conversation with her  Review of Systems Otherwise negative except as per HPI, including: General = no fevers, chills, dizziness, malaise, fatigue HEENT/EYES = negative for pain, redness, loss of vision, double vision, blurred vision, loss of hearing, sore throat, hoarseness, dysphagia Cardiovascular= negative for chest pain, palpitation, murmurs, lower extremity swelling Respiratory/lungs= negative for shortness of breath, cough, hemoptysis, wheezing, mucus production Gastrointestinal= negative for nausea, vomiting,, abdominal pain, melena, hematemesis Genitourinary= negative for Dysuria, Hematuria, Change in Urinary Frequency MSK = Negative for arthralgia, myalgias, Back Pain, Joint swelling  Neurology= Negative for headache, seizures, numbness, tingling  Psychiatry= Negative for anxiety, depression, suicidal and homocidal ideation Allergy/Immunology= Medication/Food allergy as listed  Skin= Negative for Rash, lesions, ulcers, itching   Objective: Vitals:   11/23/18 1949 11/23/18 2202 11/24/18 0621 11/24/18 0807  BP:  (!) 157/76 (!) 153/72   Pulse:  76 73   Resp:  16 20   Temp:  98 F (36.7 C) 98.4 F (36.9 C)   TempSrc:  Oral Oral   SpO2: 94% 94% 96% 92%  Weight:      Height:        Intake/Output Summary (Last 24 hours) at 11/24/2018 1131 Last data filed at 11/24/2018 0621 Gross per 24 hour  Intake -  Output 1250 ml  Net -1250 ml   Filed Weights   11/20/18 0950 11/20/18 1555  Weight: 48.1 kg 49.2 kg    Examination:  Constitutional: NAD, calm, comfortable, appears chronically ill.  Kyphotic. Eyes: PERRL, lids and conjunctivae normal ENMT: Mucous membranes are moist. Posterior pharynx clear of any exudate or  lesions.Normal dentition.  Neck: normal, supple, no masses, no thyromegaly Respiratory: bibasilcar crackles.  Cardiovascular: Regular rate and rhythm, no murmurs / rubs / gallops. No  extremity edema. 2+ pedal pulses. No carotid bruits.  Abdomen: no tenderness, no masses palpated. No hepatosplenomegaly. Bowel sounds positive.  Musculoskeletal: no clubbing / cyanosis. No joint deformity upper and lower extremities. Good ROM, no contractures. Normal muscle tone.  Skin: no rashes, lesions, ulcers. No induration Neurologic: CN 2-12 grossly intact. Sensation intact, DTR normal. Strength 4/5 in all 4.  Psychiatric: Normal judgment and insight. Alert and oriented x 3. Normal mood.     Data Reviewed:   CBC: Recent Labs  Lab 11/20/18 1016 11/21/18 0549 11/24/18 0513  WBC 4.5 4.2 5.5  NEUTROABS 2.7  --   --   HGB 10.4* 9.4* 11.1*  HCT 32.1* 29.7* 34.2*  MCV 84.7 87.4 83.4  PLT 308 297 381   Basic Metabolic Panel: Recent Labs  Lab 11/20/18 1016 11/21/18 0549 11/22/18 0455 11/23/18 0505 11/24/18 0513  NA 126* 129* 130* 127* 120*  K 3.9 3.6 3.6 3.5 3.7  CL 93* 97* 97* 93* 87*  CO2 25 25 25 25 23   GLUCOSE 92 107* 98 94 99  BUN 8 9 8  6* 6*  CREATININE 0.36* 0.48 0.38* 0.33* 0.32*  CALCIUM 9.1 8.7* 8.8* 8.9 8.9  MG  --  1.8 1.8  --  1.8   GFR: Estimated Creatinine Clearance: 41.4 mL/min (A) (by C-G formula based on SCr of 0.32 mg/dL (L)). Liver Function Tests: Recent Labs  Lab 11/20/18 1016  AST 27  ALT 18  ALKPHOS 75  BILITOT 0.6  PROT 7.9  ALBUMIN 3.2*   Recent Labs  Lab 11/20/18 1016  LIPASE 53*   No results for input(s): AMMONIA in the last 168 hours. Coagulation Profile: No results for input(s): INR, PROTIME in the last 168 hours. Cardiac Enzymes: No results for input(s): CKTOTAL, CKMB, CKMBINDEX, TROPONINI in the last 168 hours. BNP (last 3 results) No results for input(s): PROBNP in the last 8760 hours. HbA1C: No results for input(s): HGBA1C in the last 72 hours. CBG: No results for input(s): GLUCAP in the last 168 hours. Lipid Profile: No results for input(s): CHOL, HDL, LDLCALC, TRIG, CHOLHDL, LDLDIRECT in the last 72 hours.  Thyroid Function Tests: No results for input(s): TSH, T4TOTAL, FREET4, T3FREE, THYROIDAB in the last 72 hours. Anemia Panel: No results for input(s): VITAMINB12, FOLATE, FERRITIN, TIBC, IRON, RETICCTPCT in the last 72 hours. Sepsis Labs: Recent Labs  Lab 11/23/18 1305  PROCALCITON 12.01    Recent Results (from the past 240 hour(s))  SARS Coronavirus 2 (CEPHEID- Performed in [place] hospital lab), Hosp Order     Status: None   Collection Time: 11/20/18 12:29 PM   Specimen: Nasopharyngeal Swab  Result Value Ref Range Status   SARS Coronavirus 2 NEGATIVE NEGATIVE Final    Comment: (NOTE) If result is NEGATIVE SARS-CoV-2 target nucleic acids are NOT DETECTED. The SARS-CoV-2 RNA is generally detectable in upper and lower  respiratory specimens during the acute phase of infection. The lowest  concentration of SARS-CoV-2 viral copies this assay can detect is 250  copies / mL. A negative result does not preclude SARS-CoV-2 infection  and should not be used as the sole basis for treatment or other  patient management decisions.  A negative result may occur with  improper specimen collection / handling, submission of specimen other  than nasopharyngeal swab, presence of viral mutation(s) within the  areas  targeted by this assay, and inadequate number of viral copies  (<250 copies / mL). A negative result must be combined with clinical  observations, patient history, and epidemiological information. If result is POSITIVE SARS-CoV-2 target nucleic acids are DETECTED. The SARS-CoV-2 RNA is generally detectable in upper and lower  respiratory specimens dur ing the acute phase of infection.  Positive  results are indicative of active infection with SARS-CoV-2.  Clinical  correlation with patient history and other diagnostic information is  necessary to determine patient infection status.  Positive results do  not rule out bacterial infection or co-infection with other viruses. If result  is PRESUMPTIVE POSTIVE SARS-CoV-2 nucleic acids MAY BE PRESENT.   A presumptive positive result was obtained on the submitted specimen  and confirmed on repeat testing.  While 2019 novel coronavirus  (SARS-CoV-2) nucleic acids may be present in the submitted sample  additional confirmatory testing may be necessary for epidemiological  and / or clinical management purposes  to differentiate between  SARS-CoV-2 and other Sarbecovirus currently known to infect humans.  If clinically indicated additional testing with an alternate test  methodology [phone]) is advised. The SARS-CoV-2 RNA is generally  detectable in upper and lower respiratory sp ecimens during the acute  phase of infection. The expected result is Negative. Fact Sheet for Patients:  StrictlyIdeas.no Fact Sheet for Healthcare Providers: BankingDealers.co.za This test is not yet approved or cleared by the [place] FDA and has been authorized for detection and/or diagnosis of SARS-CoV-2 by FDA under an Emergency Use Authorization (EUA).  This EUA will remain in effect (meaning this test can be used) for the duration of the COVID-19 declaration under Section 564(b)(1) of the Act, 21 U.S.C. section 360bbb-3(b)(1), unless the authorization is terminated or revoked sooner. Performed at [facility], [address] [place], [place] [address]   MRSA PCR Screening     Status: Abnormal   Collection Time: 11/21/18  9:56 AM   Specimen: Nasopharyngeal  Result Value Ref Range Status   MRSA by PCR POSITIVE (A) NEGATIVE Final    Comment:        The GeneXpert MRSA Assay (FDA approved for NASAL specimens only), is one component of a comprehensive MRSA colonization surveillance program. It is not intended to diagnose MRSA infection nor to guide or monitor treatment for MRSA infections. RESULT CALLED TO, READ BACK BY AND VERIFIED WITH: BENGTSON P. AT 1115A ON [id number] BY THOMPSON S.  Performed at [facility], [address], [place], [place] [address]          Radiology Studies: No results found.      Scheduled Meds: . acetaminophen  650 mg Oral Q6H  . amLODipine  5 mg Oral Daily  . aspirin  81 mg Oral Daily  . Chlorhexidine Gluconate Cloth  6 each Topical Q0600  . enoxaparin (LOVENOX) injection  40 mg Subcutaneous Q24H  . feeding supplement (ENSURE ENLIVE)  237 mL Oral BID BM  . ferrous sulfate  325 mg Oral Q breakfast  . guaiFENesin-dextromethorphan  10 mL Oral Q8H  . ipratropium-albuterol  3 mL Nebulization TID  . lisinopril  20 mg Oral Daily  . mouth rinse  15 mL Mouth Rinse BID  . mupirocin ointment  1 application Nasal BID  . pantoprazole  40 mg Oral BID AC   Continuous Infusions: . cefTRIAXone (ROCEPHIN)  IV 2 g (11/23/18 1227)     LOS: 4 days   Time spent= 25 mins    Gram Siedlecki Chirag Virgie Kunda,  MD [facility]  If 7PM-7AM, please contact night-coverage [email or web address] 11/24/2018, 11:31 AM

## 2018-11-24 NOTE — Progress Notes (Signed)
CRITICAL VALUE ALERT  Critical Value:  Sodium 116  Date & Time Notied:  11/24/2018 at 1400  Provider Notified: Dr. Reesa Chew  Orders Received/Actions taken: awaiting instructions

## 2018-11-24 NOTE — Plan of Care (Signed)
  Problem: Education: Goal: Knowledge of General Education information will improve Description Including pain rating scale, medication(s)/side effects and non-pharmacologic comfort measures Outcome: Progressing   

## 2018-11-25 DIAGNOSIS — E222 Syndrome of inappropriate secretion of antidiuretic hormone: Secondary | ICD-10-CM

## 2018-11-25 LAB — BASIC METABOLIC PANEL
Anion gap: 9 (ref 5–15)
Anion gap: 8 (ref 5–15)
Anion gap: 9 (ref 5–15)
Anion gap: 8 (ref 5–15)
Anion gap: 8 (ref 5–15)
Anion gap: 10 (ref 5–15)
BUN: 9 mg/dL (ref 8–23)
BUN: 9 mg/dL (ref 8–23)
BUN: 12 mg/dL (ref 8–23)
BUN: 14 mg/dL (ref 8–23)
BUN: 15 mg/dL (ref 8–23)
BUN: 13 mg/dL (ref 8–23)
CO2: 23 mmol/L (ref 22–32)
CO2: 24 mmol/L (ref 22–32)
CO2: 23 mmol/L (ref 22–32)
CO2: 23 mmol/L (ref 22–32)
CO2: 22 mmol/L (ref 22–32)
CO2: 24 mmol/L (ref 22–32)
Calcium: 8.5 mg/dL — ABNORMAL LOW (ref 8.9–10.3)
Calcium: 8.7 mg/dL — ABNORMAL LOW (ref 8.9–10.3)
Calcium: 8.7 mg/dL — ABNORMAL LOW (ref 8.9–10.3)
Calcium: 8.6 mg/dL — ABNORMAL LOW (ref 8.9–10.3)
Calcium: 8.4 mg/dL — ABNORMAL LOW (ref 8.9–10.3)
Calcium: 8.7 mg/dL — ABNORMAL LOW (ref 8.9–10.3)
Chloride: 87 mmol/L — ABNORMAL LOW (ref 98–111)
Chloride: 87 mmol/L — ABNORMAL LOW (ref 98–111)
Chloride: 88 mmol/L — ABNORMAL LOW (ref 98–111)
Chloride: 88 mmol/L — ABNORMAL LOW (ref 98–111)
Chloride: 91 mmol/L — ABNORMAL LOW (ref 98–111)
Chloride: 89 mmol/L — ABNORMAL LOW (ref 98–111)
Creatinine, Ser: 0.35 mg/dL — ABNORMAL LOW (ref 0.44–1.00)
Creatinine, Ser: 0.39 mg/dL — ABNORMAL LOW (ref 0.44–1.00)
Creatinine, Ser: 0.53 mg/dL (ref 0.44–1.00)
Creatinine, Ser: 0.58 mg/dL (ref 0.44–1.00)
Creatinine, Ser: 0.48 mg/dL (ref 0.44–1.00)
Creatinine, Ser: 0.47 mg/dL (ref 0.44–1.00)
GFR calc Af Amer: >60 mL/min (ref >60)
GFR calc Af Amer: >60 mL/min (ref >60)
GFR calc Af Amer: >60 mL/min (ref >60)
GFR calc Af Amer: >60 mL/min (ref >60)
GFR calc Af Amer: >60 mL/min (ref >60)
GFR calc Af Amer: >60 mL/min (ref >60)
GFR calc non Af Amer: >60 mL/min (ref >60)
GFR calc non Af Amer: >60 mL/min (ref >60)
GFR calc non Af Amer: >60 mL/min (ref >60)
GFR calc non Af Amer: >60 mL/min (ref >60)
GFR calc non Af Amer: >60 mL/min (ref >60)
GFR calc non Af Amer: >60 mL/min (ref >60)
Glucose, Bld: 99 mg/dL (ref 70–99)
Glucose, Bld: 95 mg/dL (ref 70–99)
Glucose, Bld: 106 mg/dL — ABNORMAL HIGH (ref 70–99)
Glucose, Bld: 103 mg/dL — ABNORMAL HIGH (ref 70–99)
Glucose, Bld: 134 mg/dL — ABNORMAL HIGH (ref 70–99)
Glucose, Bld: 97 mg/dL (ref 70–99)
Potassium: 3.3 mmol/L — ABNORMAL LOW (ref 3.5–5.1)
Potassium: 3.6 mmol/L (ref 3.5–5.1)
Potassium: 3.5 mmol/L (ref 3.5–5.1)
Potassium: 3.5 mmol/L (ref 3.5–5.1)
Potassium: 3.4 mmol/L — ABNORMAL LOW (ref 3.5–5.1)
Potassium: 3.5 mmol/L (ref 3.5–5.1)
Sodium: 119 mmol/L — CL (ref 135–145)
Sodium: 119 mmol/L — CL (ref 135–145)
Sodium: 120 mmol/L — ABNORMAL LOW (ref 135–145)
Sodium: 119 mmol/L — CL (ref 135–145)
Sodium: 121 mmol/L — ABNORMAL LOW (ref 135–145)
Sodium: 123 mmol/L — ABNORMAL LOW (ref 135–145)

## 2018-11-25 LAB — CBC
HCT: 31.5 % — ABNORMAL LOW (ref 36.0–46.0)
Hemoglobin: 10.4 g/dL — ABNORMAL LOW (ref 12.0–15.0)
MCH: 27.7 pg (ref 26.0–34.0)
MCHC: 33 g/dL (ref 30.0–36.0)
MCV: 83.8 fL (ref 80.0–100.0)
Platelets: 321 10*3/uL (ref 150–400)
RBC: 3.76 MIL/uL — ABNORMAL LOW (ref 3.87–5.11)
RDW: 12.4 % (ref 11.5–15.5)
WBC: 4.2 10*3/uL (ref 4.0–10.5)
nRBC: 0 % (ref 0.0–0.2)

## 2018-11-25 LAB — MAGNESIUM: Magnesium: 1.9 mg/dL (ref 1.7–2.4)

## 2018-11-25 MED ORDER — IPRATROPIUM-ALBUTEROL 0.5-2.5 (3) MG/3ML IN SOLN
3.0000 mL | Freq: Two times a day (BID) | RESPIRATORY_TRACT | Status: DC
  Administered 2018-11-25 – 2018-11-29 (×9): 3 mL via RESPIRATORY_TRACT
  Filled 2018-11-25 (×11): qty 3

## 2018-11-25 MED ORDER — POTASSIUM CHLORIDE CRYS ER 20 MEQ PO TBCR
40.0000 meq | EXTENDED_RELEASE_TABLET | Freq: Once | ORAL | Status: AC
  Administered 2018-11-25: 10:00:00 40 meq via ORAL
  Filled 2018-11-25: qty 2

## 2018-11-25 NOTE — Progress Notes (Signed)
Physical Therapy Treatment Patient Details Name: [name] MRN: [id number] DOB: [birth year] Today's Date: 11/25/2018    History of Present Illness [name] is a 85 y.o. female who was brought in from home by 1 of her 5 children who cares for her reporting that she has been progressively weaker over the past several weeks and she has developed cough and complained of chest pain.  The patient has had poor appetite and not eating and drinking well.  The patient has not had a fever.  Patient has dementia.  The patient has a history of frequent falls but according to family members has been wheelchair-bound and not walking for the past several months.  The patient has a history of frequent UTIs and is on trimethoprim daily for prophylaxis.  The patient has a history of depression and anxiety.  The patient has GERD and hypertension and is followed by primary care for this.  The patient has had hip fractures and repair and left femoral plating.  The patient has chronic femur pain.    PT Comments    Pt supine, friendly and willing to participate.  Pt orientented x3.  Mod A with bed mobility and partial SPT with total A required due to generalized UE/LE weakness.  Added seated balance activities with A required to reduce Rt lateral lean, pt instructed UE support positions to assist with seated balance though unable to extend elbow for full UE support due to weakness.  LE exercises complete as well for strengthening.  EOS pt reports transfer was easier than yesterday.  Pt with bowel movement during SPT, NT notified and PTA did assist with standing for cleansing.  Pt left in chair with call bell within reach.      Follow Up Recommendations  Supervision for mobility/OOB;Supervision/Assistance - 24 hour;Home health PT;SNF     Equipment Recommendations  None recommended by PT    Recommendations for Other Services       Precautions / Restrictions Precautions Precautions: Fall Restrictions Weight  Bearing Restrictions: No    Mobility  Bed Mobility Overal bed mobility: Needs Assistance Bed Mobility: Supine to Sit     Supine to sit: Mod assist     General bed mobility comments: slow labored movement  Transfers Overall transfer level: Needs assistance Equipment used: 1 person hand held assist Transfers: Sit to/from Stand;Stand Pivot Transfers Sit to Stand: Total assist Stand pivot transfers: Total assist;Max assist       General transfer comment: patient unable to stand using RW, required partial stand pivot to transfer to chair  Ambulation/Gait                 Stairs             Wheelchair Mobility    Modified Rankin (Stroke Patients Only)       Balance Overall balance assessment: Needs assistance Sitting-balance support: Bilateral upper extremity supported Sitting balance-Leahy Scale: Fair Sitting balance - Comments: Tendency to lean to Rt, cueing for UE support to assist Postural control: Right lateral lean                                  Cognition Arousal/Alertness: Awake/alert Behavior During Therapy: WFL for tasks assessed/performed Overall Cognitive Status: Within Functional Limits for tasks assessed  Exercises General Exercises - Lower Extremity Long Arc Quad: Both;5 reps Heel Slides: AAROM;Both;10 reps Hip ABduction/ADduction: Supine;Both;AAROM;10 reps Toe Raises: Both;5 reps;Seated Heel Raises: Both;5 reps;Seated    General Comments        Pertinent Vitals/Pain Pain Assessment: No/denies pain    Home Living                      Prior Function            PT Goals (current goals can now be found in the care plan section)      Frequency    Min 2X/week      PT Plan Current plan remains appropriate    Co-evaluation              AM-PAC PT "6 Clicks" Mobility   Outcome Measure  Help needed turning from your back to your side  while in a flat bed without using bedrails?: A Lot Help needed moving from lying on your back to sitting on the side of a flat bed without using bedrails?: A Lot Help needed moving to and from a bed to a chair (including a wheelchair)?: A Lot Help needed standing up from a chair using your arms (e.g., wheelchair or bedside chair)?: A Lot Help needed to walk in hospital room?: Total Help needed climbing 3-5 steps with a railing? : Total 6 Click Score: 10    End of Session Equipment Utilized During Treatment: Gait belt Activity Tolerance: Patient tolerated treatment well;Patient limited by fatigue Patient left: in chair;with call bell/phone within reach Nurse Communication: Mobility status PT Visit Diagnosis: Unsteadiness on feet (R26.81);Other abnormalities of gait and mobility (R26.89);Muscle weakness (generalized) (M62.81)     Time: 1325-1400 PT Time Calculation (min) (ACUTE ONLY): 35 min  Charges:  $Therapeutic Exercise: 8-22 mins $Therapeutic Activity: 8-22 mins                     [address] LPTA; CBIS [phone]   Aldona Lento 11/25/2018, 2:05 PM

## 2018-11-25 NOTE — Progress Notes (Signed)
PROGRESS NOTE    [name]  WUJ:[id number] DOB: Feb 29, 1934 DOA: 11/20/2018 PCP: Medicine, [facility]   Brief Narrative:  85 year old with a history of GERD, essential hypertension, anemia of chronic disease admitted to the hospital for cough and atypical chest pain.  Patient was diagnosed with right lower lobe pneumonia started on IV Rocephin and azithromycin.  Along with bronchodilators and supplemental oxygen.  Patient was initially hyponatremic with signs of dehydration therefore received IV fluids. But later appears to be due to SIADH therefore placed on fluid restrction and Salt Tabs.    Assessment & Plan:   Principal Problem:   Right lower lobe pneumonia (HCC) Active Problems:   Hypertension   Hyponatremia   Anemia, chronic disease   GERD (gastroesophageal reflux disease)   Does not walk - Wheelchair bound   Generalized weakness  Right lower lobe pneumonia, better.  - Appears to be doing better on IV Rocephin and azithromycin.  Complete 5-day course. -Cont Bronchodilators scheduled as needed. -Incentive spirometer and flutter valve. -Supplemental oxygen as needed. -ProCal was 12  Hyponatremia - SIADH -Had worsening hyponatremia therefore placed on 200 cc fluid restriction and salt tablets.  Currently slowly improving.  We will continue close monitoring.  Anemia of chronic disease -On iron supplements.  Chronic leg pain -On tramadol and Tylenol.  GERD -PPI  Abnormal UA -Does not appear to be symptomatic.  Questionable UTI.  Already on Rocephin.   DVT prophylaxis: Lovenox Code Status: Full code Family Communication: None at bedside Disposition Plan: Maintain hospital stay until sodium levels are better  Consultants:   None  Procedures:   None  Antimicrobials:   Rocephin  Azithromycin   Subjective: Overall patient feels okay does not have any complaints.  Taking her medications as prescribed.  Review of Systems Otherwise negative except  as per HPI, including: General = no fevers, chills, dizziness, malaise, fatigue HEENT/EYES = negative for pain, redness, loss of vision, double vision, blurred vision, loss of hearing, sore throat, hoarseness, dysphagia Cardiovascular= negative for chest pain, palpitation, murmurs, lower extremity swelling Respiratory/lungs= negative for shortness of breath, cough, hemoptysis, wheezing, mucus production Gastrointestinal= negative for nausea, vomiting,, abdominal pain, melena, hematemesis Genitourinary= negative for Dysuria, Hematuria, Change in Urinary Frequency MSK = Negative for arthralgia, myalgias, Back Pain, Joint swelling  Neurology= Negative for headache, seizures, numbness, tingling  Psychiatry= Negative for anxiety, depression, suicidal and homocidal ideation Allergy/Immunology= Medication/Food allergy as listed  Skin= Negative for Rash, lesions, ulcers, itching   Objective: Vitals:   11/24/18 1958 11/24/18 2052 11/25/18 0656 11/25/18 0735  BP:  135/69 119/71   Pulse:  77 77   Resp:  17 15   Temp:  98.4 F (36.9 C) 97.9 F (36.6 C)   TempSrc:   Oral   SpO2: 91% 95% 96% 98%  Weight:      Height:        Intake/Output Summary (Last 24 hours) at 11/25/2018 1046 Last data filed at 11/25/2018 0500 Gross per 24 hour  Intake -  Output 700 ml  Net -700 ml   Filed Weights   11/20/18 0950 11/20/18 1555  Weight: 48.1 kg 49.2 kg    Examination: Constitutional: NAD, calm, comfortable, kyphotic temporal wasting bilaterally, some muscle wasting. Eyes: PERRL, lids and conjunctivae normal ENMT: Mucous membranes are moist. Posterior pharynx clear of any exudate or lesions.Normal dentition.  Neck: normal, supple, no masses, no thyromegaly Respiratory: Minimal bibasilar crackles Cardiovascular: Regular rate and rhythm, no murmurs / rubs / gallops. No extremity edema.  2+ pedal pulses. No carotid bruits.  Abdomen: no tenderness, no masses palpated. No hepatosplenomegaly. Bowel sounds  positive.  Musculoskeletal: no clubbing / cyanosis. No joint deformity upper and lower extremities. Good ROM, no contractures. Normal muscle tone.  Skin: no rashes, lesions, ulcers. No induration Neurologic: CN 2-12 grossly intact. Sensation intact, DTR normal. Strength 4/5 in all 4.  Psychiatric: Normal judgment and insight. Alert and oriented x 3. Normal mood.    Data Reviewed:   CBC: Recent Labs  Lab 11/20/18 1016 11/21/18 0549 11/24/18 0513 11/25/18 0658  WBC 4.5 4.2 5.5 4.2  NEUTROABS 2.7  --   --   --   HGB 10.4* 9.4* 11.1* 10.4*  HCT 32.1* 29.7* 34.2* 31.5*  MCV 84.7 87.4 83.4 83.8  PLT 308 297 325 563   Basic Metabolic Panel: Recent Labs  Lab 11/21/18 0549 11/22/18 0455  11/24/18 0513 11/24/18 1300 11/24/18 1923 11/25/18 0114 11/25/18 0658  NA 129* 130*   < > 120* 116* 116* 119* 119*  K 3.6 3.6   < > 3.7 3.3* 3.4* 3.3* 3.6  CL 97* 97*   < > 87* 84* 86* 87* 87*  CO2 25 25   < > 23 23 22 23 24   GLUCOSE 107* 98   < > 99 138* 131* 99 95  BUN 9 8   < > 6* 7* 12 9 9   CREATININE 0.48 0.38*   < > 0.32* 0.32* 0.42* 0.35* 0.39*  CALCIUM 8.7* 8.8*   < > 8.9 8.7* 8.3* 8.5* 8.7*  MG 1.8 1.8  --  1.8  --   --   --  1.9   < > = values in this interval not displayed.   GFR: Estimated Creatinine Clearance: 41.4 mL/min (A) (by C-G formula based on SCr of 0.39 mg/dL (L)). Liver Function Tests: Recent Labs  Lab 11/20/18 1016  AST 27  ALT 18  ALKPHOS 75  BILITOT 0.6  PROT 7.9  ALBUMIN 3.2*   Recent Labs  Lab 11/20/18 1016  LIPASE 53*   No results for input(s): AMMONIA in the last 168 hours. Coagulation Profile: No results for input(s): INR, PROTIME in the last 168 hours. Cardiac Enzymes: No results for input(s): CKTOTAL, CKMB, CKMBINDEX, TROPONINI in the last 168 hours. BNP (last 3 results) No results for input(s): PROBNP in the last 8760 hours. HbA1C: No results for input(s): HGBA1C in the last 72 hours. CBG: No results for input(s): GLUCAP in the last 168  hours. Lipid Profile: No results for input(s): CHOL, HDL, LDLCALC, TRIG, CHOLHDL, LDLDIRECT in the last 72 hours. Thyroid Function Tests: No results for input(s): TSH, T4TOTAL, FREET4, T3FREE, THYROIDAB in the last 72 hours. Anemia Panel: No results for input(s): VITAMINB12, FOLATE, FERRITIN, TIBC, IRON, RETICCTPCT in the last 72 hours. Sepsis Labs: Recent Labs  Lab 11/23/18 1305  PROCALCITON 12.01    Recent Results (from the past 240 hour(s))  SARS Coronavirus 2 (CEPHEID- Performed in [place] hospital lab), Hosp Order     Status: None   Collection Time: 11/20/18 12:29 PM   Specimen: Nasopharyngeal Swab  Result Value Ref Range Status   SARS Coronavirus 2 NEGATIVE NEGATIVE Final    Comment: (NOTE) If result is NEGATIVE SARS-CoV-2 target nucleic acids are NOT DETECTED. The SARS-CoV-2 RNA is generally detectable in upper and lower  respiratory specimens during the acute phase of infection. The lowest  concentration of SARS-CoV-2 viral copies this assay can detect is 250  copies / mL. A negative result  does not preclude SARS-CoV-2 infection  and should not be used as the sole basis for treatment or other  patient management decisions.  A negative result may occur with  improper specimen collection / handling, submission of specimen other  than nasopharyngeal swab, presence of viral mutation(s) within the  areas targeted by this assay, and inadequate number of viral copies  (<250 copies / mL). A negative result must be combined with clinical  observations, patient history, and epidemiological information. If result is POSITIVE SARS-CoV-2 target nucleic acids are DETECTED. The SARS-CoV-2 RNA is generally detectable in upper and lower  respiratory specimens dur ing the acute phase of infection.  Positive  results are indicative of active infection with SARS-CoV-2.  Clinical  correlation with patient history and other diagnostic information is  necessary to determine patient  infection status.  Positive results do  not rule out bacterial infection or co-infection with other viruses. If result is PRESUMPTIVE POSTIVE SARS-CoV-2 nucleic acids MAY BE PRESENT.   A presumptive positive result was obtained on the submitted specimen  and confirmed on repeat testing.  While 2019 novel coronavirus  (SARS-CoV-2) nucleic acids may be present in the submitted sample  additional confirmatory testing may be necessary for epidemiological  and / or clinical management purposes  to differentiate between  SARS-CoV-2 and other Sarbecovirus currently known to infect humans.  If clinically indicated additional testing with an alternate test  methodology [phone]) is advised. The SARS-CoV-2 RNA is generally  detectable in upper and lower respiratory sp ecimens during the acute  phase of infection. The expected result is Negative. Fact Sheet for Patients:  StrictlyIdeas.no Fact Sheet for Healthcare Providers: BankingDealers.co.za This test is not yet approved or cleared by the [place] FDA and has been authorized for detection and/or diagnosis of SARS-CoV-2 by FDA under an Emergency Use Authorization (EUA).  This EUA will remain in effect (meaning this test can be used) for the duration of the COVID-19 declaration under Section 564(b)(1) of the Act, 21 U.S.C. section 360bbb-3(b)(1), unless the authorization is terminated or revoked sooner. Performed at [facility], [address], [place], [place] [address]   MRSA PCR Screening     Status: Abnormal   Collection Time: 11/21/18  9:56 AM   Specimen: Nasopharyngeal  Result Value Ref Range Status   MRSA by PCR POSITIVE (A) NEGATIVE Final    Comment:        The GeneXpert MRSA Assay (FDA approved for NASAL specimens only), is one component of a comprehensive MRSA colonization surveillance program. It is not intended to diagnose MRSA infection nor to guide or monitor treatment  for MRSA infections. RESULT CALLED TO, READ BACK BY AND VERIFIED WITH: BENGTSON P. AT 1115A ON [id number] BY THOMPSON S. Performed at [facility], [address], [place], [place] [address]          Radiology Studies: No results found.      Scheduled Meds: . acetaminophen  650 mg Oral Q6H  . amLODipine  5 mg Oral Daily  . aspirin  81 mg Oral Daily  . Chlorhexidine Gluconate Cloth  6 each Topical Q0600  . enoxaparin (LOVENOX) injection  40 mg Subcutaneous Q24H  . feeding supplement (ENSURE ENLIVE)  237 mL Oral BID BM  . ferrous sulfate  325 mg Oral Q breakfast  . guaiFENesin-dextromethorphan  10 mL Oral Q8H  . ipratropium-albuterol  3 mL Nebulization TID  . lisinopril  20 mg Oral Daily  . mouth rinse  15 mL Mouth  Rinse BID  . mupirocin ointment  1 application Nasal BID  . pantoprazole  40 mg Oral BID AC  . sodium chloride  1 g Oral TID WC   Continuous Infusions:    LOS: 5 days   Time spent= 35 mins    Jahkai Yandell Arsenio Loader, MD [facility]  If 7PM-7AM, please contact night-coverage [email or web address] 11/25/2018, 10:46 AM

## 2018-11-25 NOTE — Plan of Care (Signed)
  Problem: Education: Goal: Knowledge of General Education information will improve Description: Including pain rating scale, medication(s)/side effects and non-pharmacologic comfort measures Outcome: Progressing   Problem: Clinical Measurements: Goal: Ability to maintain clinical measurements within normal limits will improve Outcome: Progressing   

## 2018-11-26 DIAGNOSIS — J181 Lobar pneumonia, unspecified organism: Secondary | ICD-10-CM

## 2018-11-26 LAB — CBC
HCT: 30.7 % — ABNORMAL LOW (ref 36.0–46.0)
Hemoglobin: 10.2 g/dL — ABNORMAL LOW (ref 12.0–15.0)
MCH: 27.8 pg (ref 26.0–34.0)
MCHC: 33.2 g/dL (ref 30.0–36.0)
MCV: 83.7 fL (ref 80.0–100.0)
Platelets: 319 10*3/uL (ref 150–400)
RBC: 3.67 MIL/uL — ABNORMAL LOW (ref 3.87–5.11)
RDW: 12.6 % (ref 11.5–15.5)
WBC: 3.1 10*3/uL — ABNORMAL LOW (ref 4.0–10.5)
nRBC: 0 % (ref 0.0–0.2)

## 2018-11-26 LAB — BASIC METABOLIC PANEL
Anion gap: 9 (ref 5–15)
Anion gap: 7 (ref 5–15)
BUN: 11 mg/dL (ref 8–23)
BUN: 16 mg/dL (ref 8–23)
CO2: 25 mmol/L (ref 22–32)
CO2: 24 mmol/L (ref 22–32)
Calcium: 9 mg/dL (ref 8.9–10.3)
Calcium: 8.8 mg/dL — ABNORMAL LOW (ref 8.9–10.3)
Chloride: 92 mmol/L — ABNORMAL LOW (ref 98–111)
Chloride: 93 mmol/L — ABNORMAL LOW (ref 98–111)
Creatinine, Ser: 0.4 mg/dL — ABNORMAL LOW (ref 0.44–1.00)
Creatinine, Ser: 0.47 mg/dL (ref 0.44–1.00)
GFR calc Af Amer: >60 mL/min (ref >60)
GFR calc Af Amer: >60 mL/min (ref >60)
GFR calc non Af Amer: >60 mL/min (ref >60)
GFR calc non Af Amer: >60 mL/min (ref >60)
Glucose, Bld: 89 mg/dL (ref 70–99)
Glucose, Bld: 147 mg/dL — ABNORMAL HIGH (ref 70–99)
Potassium: 3.7 mmol/L (ref 3.5–5.1)
Potassium: 3.3 mmol/L — ABNORMAL LOW (ref 3.5–5.1)
Sodium: 126 mmol/L — ABNORMAL LOW (ref 135–145)
Sodium: 124 mmol/L — ABNORMAL LOW (ref 135–145)

## 2018-11-26 LAB — MAGNESIUM: Magnesium: 2 mg/dL (ref 1.7–2.4)

## 2018-11-26 NOTE — Plan of Care (Signed)
  Problem: Education: Goal: Knowledge of General Education information will improve Description Including pain rating scale, medication(s)/side effects and non-pharmacologic comfort measures Outcome: Progressing   Problem: Health Behavior/Discharge Planning: Goal: Ability to manage health-related needs will improve Outcome: Progressing   

## 2018-11-26 NOTE — Progress Notes (Signed)
PROGRESS NOTE  [name] WVP:[id number] DOB: 1933-06-28 DOA: 11/20/2018 PCP: Medicine, [facility]  Brief History:  85 y.o. female who was brought in from home by 1 of her 5 children who cares for her reporting that she has been progressively weaker over the past several weeks and she has developed cough and complained of chest pain.  The patient has had poor appetite and not eating and drinking well.  The patient has not had a fever.  Patient was a poor historian secondary to her cognitive impairment..  The patient has a history of frequent falls but according to family members has been wheelchair-bound and not walking for the past several months.    At the time of admission, chest x-ray showed multilobar airspace disease and interstitial prominence in the right lung.  The patient was started on ceftriaxone and azithromycin for pneumonia.  She improved clinically, but developed worsening hyponatremia during the hospitalization.  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  The patient was placed on fluid restriction and sodium chloride tablets.  Assessment/Plan: Lobar pneumonia -Personally reviewed chest x-ray--patchy airspace disease bilateral -Finished 5 days of ceftriaxone and azithromycin -Clinically stable on room air  Hyponatremia -Patient has hyponatremia at baseline -Reviewed the medical record shows she has had Na 127-132, intermittently increasing to 135 -Likely due to poor solute intake -Acute drop in sodium likely attributable to SIADH from the patient's acute medical illness as well as possibly azithromycin -Continue fluid restriction and sodium tablets -Repeat urinalysis, urine sodium -A.m. cortisol level -Check lipid panel  Essential hypertension -Continue amlodipine -Discontinue lisinopril as this may also contribute to hyponatremia -Blood pressure well controlled  Chronic leg pain -Continue acetaminophen  and tramadol  Generalized weakness/deconditioning -PT evaluation--no PT follow up -pt receives 24/7 care at home from family -Urinalysis negative for significant pyuria  GERD -conitnue PPI        Disposition Plan:   Home  When Na improved/stable Family Communication:  Daughter updated on phone 8/1  Consultants:  none  Code Status:  FULL   DVT Prophylaxis:  [place] Lovenox   Procedures: As Listed in Progress Note Above  Antibiotics: Ceftriaxone/azithro 7/26-7/30       Subjective: Patient denies fevers, chills, headache, chest pain, dyspnea, nausea, vomiting, diarrhea, abdominal pain, dysuria, hematuria, hematochezia, and melena.   Objective: Vitals:   11/25/18 1958 11/25/18 2153 11/26/18 0752 11/26/18 1251  BP:  118/72  (!) 104/91  Pulse: 75 77  91  Resp: 18 16  16   Temp:  98.4 F (36.9 C)  98.7 F (37.1 C)  TempSrc:    Oral  SpO2: 93% 95% 94% 93%  Weight:      Height:        Intake/Output Summary (Last 24 hours) at 11/26/2018 1617 Last data filed at 11/26/2018 1300 Gross per 24 hour  Intake 720 ml  Output 950 ml  Net -230 ml   Weight change:  Exam:   General:  Pt is alert, follows commands appropriately, not in acute distress  HEENT: No icterus, No thrush, No neck mass, [place]/AT  Cardiovascular: RRR, S1/S2, no rubs, no gallops  Respiratory:R-basilar rales > L  Abdomen: Soft/+BS, non tender, non distended, no guarding  Extremities: No edema, No lymphangitis, No petechiae, No rashes, no synovitis   Data Reviewed: I have personally reviewed following labs and imaging studies Basic Metabolic Panel: Recent Labs  Lab 11/21/18 0549 11/22/18  7001  11/24/18 7494  11/25/18 4967  11/25/18 1442 11/25/18 1819 11/25/18 2133 11/26/18 0557 11/26/18 1408  NA 129* 130*   < > 120*   < > 119*   < > 119* 121* 123* 126* 124*  K 3.6 3.6   < > 3.7   < > 3.6   < > 3.5 3.4* 3.5 3.7 3.3*  CL 97* 97*   < > 87*   < > 87*   < > 88* 91* 89* 92* 93*  CO2 25 25   <  > 23   < > 24   < > 23 22 24 25 24   GLUCOSE 107* 98   < > 99   < > 95   < > 103* 134* 97 89 147*  BUN 9 8   < > 6*   < > 9   < > 14 15 13 11 16   CREATININE 0.48 0.38*   < > 0.32*   < > 0.39*   < > 0.58 0.48 0.47 0.40* 0.47  CALCIUM 8.7* 8.8*   < > 8.9   < > 8.7*   < > 8.6* 8.4* 8.7* 9.0 8.8*  MG 1.8 1.8  --  1.8  --  1.9  --   --   --   --  2.0  --    < > = values in this interval not displayed.   Liver Function Tests: Recent Labs  Lab 11/20/18 1016  AST 27  ALT 18  ALKPHOS 75  BILITOT 0.6  PROT 7.9  ALBUMIN 3.2*   Recent Labs  Lab 11/20/18 1016  LIPASE 53*   No results for input(s): AMMONIA in the last 168 hours. Coagulation Profile: No results for input(s): INR, PROTIME in the last 168 hours. CBC: Recent Labs  Lab 11/20/18 1016 11/21/18 0549 11/24/18 0513 11/25/18 0658 11/26/18 0557  WBC 4.5 4.2 5.5 4.2 3.1*  NEUTROABS 2.7  --   --   --   --   HGB 10.4* 9.4* 11.1* 10.4* 10.2*  HCT 32.1* 29.7* 34.2* 31.5* 30.7*  MCV 84.7 87.4 83.4 83.8 83.7  PLT 308 297 325 321 319   Cardiac Enzymes: No results for input(s): CKTOTAL, CKMB, CKMBINDEX, TROPONINI in the last 168 hours. BNP: Invalid input(s): POCBNP CBG: No results for input(s): GLUCAP in the last 168 hours. HbA1C: No results for input(s): HGBA1C in the last 72 hours. Urine analysis:    Component Value Date/Time   COLORURINE STRAW (A) 11/20/2018 1134   APPEARANCEUR CLEAR 11/20/2018 1134   LABSPEC 1.002 (L) 11/20/2018 1134   PHURINE 8.0 11/20/2018 1134   GLUCOSEU NEGATIVE 11/20/2018 1134   HGBUR SMALL (A) 11/20/2018 1134   BILIRUBINUR NEGATIVE 11/20/2018 1134   KETONESUR NEGATIVE 11/20/2018 1134   PROTEINUR NEGATIVE 11/20/2018 1134   UROBILINOGEN 0.2 06/08/2012 1148   NITRITE NEGATIVE 11/20/2018 1134   LEUKOCYTESUR SMALL (A) 11/20/2018 1134   Sepsis Labs: @LABRCNTIP (procalcitonin:4,lacticidven:4) ) Recent Results (from the past 240 hour(s))  SARS Coronavirus 2 (CEPHEID- Performed in [place]  hospital lab), Hosp Order     Status: None   Collection Time: 11/20/18 12:29 PM   Specimen: Nasopharyngeal Swab  Result Value Ref Range Status   SARS Coronavirus 2 NEGATIVE NEGATIVE Final    Comment: (NOTE) If result is NEGATIVE SARS-CoV-2 target nucleic acids are NOT DETECTED. The SARS-CoV-2 RNA is generally detectable in upper and lower  respiratory specimens during the acute phase of infection. The lowest  concentration of SARS-CoV-2 viral  copies this assay can detect is 250  copies / mL. A negative result does not preclude SARS-CoV-2 infection  and should not be used as the sole basis for treatment or other  patient management decisions.  A negative result may occur with  improper specimen collection / handling, submission of specimen other  than nasopharyngeal swab, presence of viral mutation(s) within the  areas targeted by this assay, and inadequate number of viral copies  (<250 copies / mL). A negative result must be combined with clinical  observations, patient history, and epidemiological information. If result is POSITIVE SARS-CoV-2 target nucleic acids are DETECTED. The SARS-CoV-2 RNA is generally detectable in upper and lower  respiratory specimens dur ing the acute phase of infection.  Positive  results are indicative of active infection with SARS-CoV-2.  Clinical  correlation with patient history and other diagnostic information is  necessary to determine patient infection status.  Positive results do  not rule out bacterial infection or co-infection with other viruses. If result is PRESUMPTIVE POSTIVE SARS-CoV-2 nucleic acids MAY BE PRESENT.   A presumptive positive result was obtained on the submitted specimen  and confirmed on repeat testing.  While 2019 novel coronavirus  (SARS-CoV-2) nucleic acids may be present in the submitted sample  additional confirmatory testing may be necessary for epidemiological  and / or clinical management purposes  to differentiate  between  SARS-CoV-2 and other Sarbecovirus currently known to infect humans.  If clinically indicated additional testing with an alternate test  methodology [phone]) is advised. The SARS-CoV-2 RNA is generally  detectable in upper and lower respiratory sp ecimens during the acute  phase of infection. The expected result is Negative. Fact Sheet for Patients:  StrictlyIdeas.no Fact Sheet for Healthcare Providers: BankingDealers.co.za This test is not yet approved or cleared by the [place] FDA and has been authorized for detection and/or diagnosis of SARS-CoV-2 by FDA under an Emergency Use Authorization (EUA).  This EUA will remain in effect (meaning this test can be used) for the duration of the COVID-19 declaration under Section 564(b)(1) of the Act, 21 U.S.C. section 360bbb-3(b)(1), unless the authorization is terminated or revoked sooner. Performed at [facility], [address], [place], [place] [address]   MRSA PCR Screening     Status: Abnormal   Collection Time: 11/21/18  9:56 AM   Specimen: Nasopharyngeal  Result Value Ref Range Status   MRSA by PCR POSITIVE (A) NEGATIVE Final    Comment:        The GeneXpert MRSA Assay (FDA approved for NASAL specimens only), is one component of a comprehensive MRSA colonization surveillance program. It is not intended to diagnose MRSA infection nor to guide or monitor treatment for MRSA infections. RESULT CALLED TO, READ BACK BY AND VERIFIED WITH: BENGTSON P. AT 1115A ON [id number] BY THOMPSON S. Performed at [facility], [address] [place], [place] [address]      Scheduled Meds: . acetaminophen  650 mg Oral Q6H  . amLODipine  5 mg Oral Daily  . aspirin  81 mg Oral Daily  . enoxaparin (LOVENOX) injection  40 mg Subcutaneous Q24H  . feeding supplement (ENSURE ENLIVE)  237 mL Oral BID BM  . ferrous sulfate  325 mg Oral Q breakfast  . ipratropium-albuterol  3 mL Nebulization  BID  . mouth rinse  15 mL Mouth Rinse BID  . pantoprazole  40 mg Oral BID AC  . sodium chloride  1 g Oral TID WC   Continuous Infusions:  Procedures/Studies: Dg Chest Port 1 View  Result Date: 11/20/2018 CLINICAL DATA:  85 year old female with history of progressive weakness as well as cough and some chest pain. EXAM: PORTABLE CHEST 1 VIEW COMPARISON:  Chest x-ray 10/17/2018. FINDINGS: Patchy ill-defined areas of interstitial prominence and airspace disease in the right lung, most evident in the right upper lobe and medial aspect of the right lung base, concerning for multilobar pneumonia. Left lung appears clear. No pleural effusions. No evidence of pulmonary edema. Heart size is normal. Upper mediastinal contours are within normal limits. Aortic atherosclerosis. IMPRESSION: 1. Findings are concerning for developing multilobar bronchopneumonia in the right lung, as above. 2. Aortic atherosclerosis. Electronically Signed   By: Vinnie Langton M.D.   On: 11/20/2018 10:41   Dg Femur Min 2 Views Left  Result Date: 11/20/2018 CLINICAL DATA:  LEFT leg pain.  History of prior fracture and ORIF. EXAM: LEFT FEMUR 2 VIEWS COMPARISON:  None. FINDINGS: Plate/wire/screw fixation of the LEFT femur is again noted. There is SUPERIOR position of the remaining LEFT femur in relation to the acetabulum with no discernible femoral head identified. This is unchanged from 06/15/2017. No new abnormalities or acute fractures noted. Degenerative changes in the knee again identified. IMPRESSION: 1. No acute abnormality. No interval change from 06/15/2017. LEFT femur ORIF with SUPERIOR position of the LEFT femur in relation to the acetabulum with no discernible femoral head identified. Electronically Signed   By: Margarette Canada M.D.   On: 11/20/2018 12:32   Dg Femur Min 2 Views Right  Result Date: 11/20/2018 CLINICAL DATA:  RIGHT leg pain.  Initial encounter. EXAM: RIGHT FEMUR 2 VIEWS COMPARISON:  None. FINDINGS: No evidence  of fracture or dislocation. No focal bony lesions are present. Severe degenerative changes in the knee are noted. IMPRESSION: No acute abnormality Electronically Signed   By: Margarette Canada M.D.   On: 11/20/2018 12:29    Orson Eva, DO  [facility] Pager [phone]  If 7PM-7AM, please contact night-coverage [email or web address] Password [email or web address] 11/26/2018, 4:17 PM   LOS: 6 days

## 2018-11-27 LAB — LEGIONELLA PNEUMOPHILA SEROGP 1 UR AG: L. pneumophila Serogp 1 Ur Ag: NEGATIVE

## 2018-11-27 LAB — BASIC METABOLIC PANEL
Anion gap: 10 (ref 5–15)
BUN: 10 mg/dL (ref 8–23)
CO2: 25 mmol/L (ref 22–32)
Calcium: 9.1 mg/dL (ref 8.9–10.3)
Chloride: 91 mmol/L — ABNORMAL LOW (ref 98–111)
Creatinine, Ser: <0.3 mg/dL — ABNORMAL LOW (ref 0.44–1.00)
Glucose, Bld: 91 mg/dL (ref 70–99)
Potassium: 3.3 mmol/L — ABNORMAL LOW (ref 3.5–5.1)
Sodium: 126 mmol/L — ABNORMAL LOW (ref 135–145)

## 2018-11-27 LAB — MAGNESIUM: Magnesium: 2 mg/dL (ref 1.7–2.4)

## 2018-11-27 MED ORDER — POTASSIUM CHLORIDE CRYS ER 20 MEQ PO TBCR
40.0000 meq | EXTENDED_RELEASE_TABLET | Freq: Once | ORAL | Status: AC
  Administered 2018-11-27: 40 meq via ORAL
  Filled 2018-11-27: qty 2

## 2018-11-27 NOTE — Plan of Care (Signed)
Pt does not remember when last BM was, last recorded was 11/22/2018.   Problem: Education: Goal: Knowledge of General Education information will improve Description: Including pain rating scale, medication(s)/side effects and non-pharmacologic comfort measures Outcome: Progressing   Problem: Health Behavior/Discharge Planning: Goal: Ability to manage health-related needs will improve Outcome: Progressing   Problem: Clinical Measurements: Goal: Ability to maintain clinical measurements within normal limits will improve Outcome: Progressing Goal: Will remain free from infection Outcome: Progressing Goal: Diagnostic test results will improve Outcome: Progressing Goal: Respiratory complications will improve Outcome: Progressing Goal: Cardiovascular complication will be avoided Outcome: Progressing   Problem: Activity: Goal: Risk for activity intolerance will decrease Outcome: Progressing   Problem: Nutrition: Goal: Adequate nutrition will be maintained Outcome: Progressing   Problem: Coping: Goal: Level of anxiety will decrease Outcome: Progressing   Problem: Elimination: Goal: Will not experience complications related to urinary retention Outcome: Progressing   Problem: Pain Managment: Goal: General experience of comfort will improve Outcome: Progressing   Problem: Safety: Goal: Ability to remain free from injury will improve Outcome: Progressing   Problem: Skin Integrity: Goal: Risk for impaired skin integrity will decrease Outcome: Progressing

## 2018-11-27 NOTE — Progress Notes (Addendum)
PROGRESS NOTE  [name] AYT:[id number] DOB: 1933/09/15 DOA: 11/20/2018 PCP: Medicine, [facility]  Brief History:  85 y.o.femalewho was brought in from home by 1 of her 5 children who cares for her reporting that she has been progressively weaker over the past several weeks and she has developed cough and complained of chest pain. The patient has had poor appetite and not eating and drinking well. The patient has not had a fever. Patient was a poor historian secondary to her cognitive impairment.. The patient has a history of frequent falls but according to family members has been wheelchair-bound and not walking for the past several months.   At the time of admission, chest x-ray showed multilobar airspace disease and interstitial prominence in the right lung.  The patient was started on ceftriaxone and azithromycin for pneumonia.  She improved clinically, but developed worsening hyponatremia during the hospitalization.  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  The patient was placed on fluid restriction and sodium chloride tablets.  Assessment/Plan: Lobar pneumonia -Personally reviewed chest x-ray--patchy airspace disease bilateral -Finished 5 days of ceftriaxone and azithromycin -Clinically stable on room air  Hyponatremia -Patient has hyponatremia at baseline -Reviewed the medical record shows she has had Na 129-132, intermittently increasing to 135 -Likely due to poor solute intake -Acute drop in sodium likely attributable to SIADH from the patient's acute medical illness as well as possibly azithromycin -Continue fluid restriction and sodium tablets -Repeat urinalysis, urine sodium -A.m. cortisol level -Check lipid panel  Essential hypertension -Continue amlodipine -Discontinue lisinopril as this may also contribute to hyponatremia -Blood pressure controlled  Chronic leg pain -Continue acetaminophen  and tramadol  Generalized weakness/deconditioning -PT evaluation--no PT follow up -pt receives 24/7 care at home from family -Urinalysis negative for significant pyuria  GERD -conitnue PPI     Disposition Plan:   Home  When Na improved/stable--possible 8/3 or 8/4 Family Communication:  Daughter updated on phone 8/2  Consultants:  none  Code Status:  FULL   DVT Prophylaxis:  [place] Lovenox   Procedures: As Listed in Progress Note Above  Antibiotics: Ceftriaxone/azithro 7/26-7/30      Subjective: Patient denies fevers, chills, headache, chest pain, dyspnea, nausea, vomiting, diarrhea, abdominal pain, dysuria, hematuria, hematochezia, and melena.   Objective: Vitals:   11/26/18 2058 11/27/18 0640 11/27/18 0721 11/27/18 1116  BP: 132/62 (!) 169/65  (!) 149/101  Pulse: 73 79    Resp: 17 17    Temp: 98.6 F (37 C) 98.6 F (37 C)    TempSrc:      SpO2: 95% 96% 96%   Weight:      Height:        Intake/Output Summary (Last 24 hours) at 11/27/2018 1549 Last data filed at 11/27/2018 1242 Gross per 24 hour  Intake 480 ml  Output 1100 ml  Net -620 ml   Weight change:  Exam:   General:  Pt is alert, follows commands appropriately, not in acute distress  HEENT: No icterus, No thrush, No neck mass, [place]/AT  Cardiovascular: RRR, S1/S2, no rubs, no gallops  Respiratory: R-basilar rales. No wheeze  Abdomen: Soft/+BS, non tender, non distended, no guarding  Extremities: No edema, No lymphangitis, No petechiae, No rashes, no synovitis   Data Reviewed: I have personally reviewed following labs and imaging studies Basic Metabolic Panel: Recent Labs  Lab 11/22/18 0455  11/24/18 0513  11/25/18 0658  11/25/18  1819 11/25/18 2133 11/26/18 0557 11/26/18 1408 11/27/18 0607  NA 130*   < > 120*   < > 119*   < > 121* 123* 126* 124* 126*  K 3.6   < > 3.7   < > 3.6   < > 3.4* 3.5 3.7 3.3* 3.3*  CL 97*   < > 87*   < > 87*   < > 91* 89* 92* 93* 91*  CO2 25    < > 23   < > 24   < > 22 24 25 24 25   GLUCOSE 98   < > 99   < > 95   < > 134* 97 89 147* 91  BUN 8   < > 6*   < > 9   < > 15 13 11 16 10   CREATININE 0.38*   < > 0.32*   < > 0.39*   < > 0.48 0.47 0.40* 0.47 <0.30*  CALCIUM 8.8*   < > 8.9   < > 8.7*   < > 8.4* 8.7* 9.0 8.8* 9.1  MG 1.8  --  1.8  --  1.9  --   --   --  2.0  --  2.0   < > = values in this interval not displayed.   Liver Function Tests: No results for input(s): AST, ALT, ALKPHOS, BILITOT, PROT, ALBUMIN in the last 168 hours. No results for input(s): LIPASE, AMYLASE in the last 168 hours. No results for input(s): AMMONIA in the last 168 hours. Coagulation Profile: No results for input(s): INR, PROTIME in the last 168 hours. CBC: Recent Labs  Lab 11/21/18 0549 11/24/18 0513 11/25/18 0658 11/26/18 0557  WBC 4.2 5.5 4.2 3.1*  HGB 9.4* 11.1* 10.4* 10.2*  HCT 29.7* 34.2* 31.5* 30.7*  MCV 87.4 83.4 83.8 83.7  PLT 297 325 321 319   Cardiac Enzymes: No results for input(s): CKTOTAL, CKMB, CKMBINDEX, TROPONINI in the last 168 hours. BNP: Invalid input(s): POCBNP CBG: No results for input(s): GLUCAP in the last 168 hours. HbA1C: No results for input(s): HGBA1C in the last 72 hours. Urine analysis:    Component Value Date/Time   COLORURINE STRAW (A) 11/20/2018 1134   APPEARANCEUR CLEAR 11/20/2018 1134   LABSPEC 1.002 (L) 11/20/2018 1134   PHURINE 8.0 11/20/2018 1134   GLUCOSEU NEGATIVE 11/20/2018 1134   HGBUR SMALL (A) 11/20/2018 1134   BILIRUBINUR NEGATIVE 11/20/2018 1134   KETONESUR NEGATIVE 11/20/2018 1134   PROTEINUR NEGATIVE 11/20/2018 1134   UROBILINOGEN 0.2 06/08/2012 1148   NITRITE NEGATIVE 11/20/2018 1134   LEUKOCYTESUR SMALL (A) 11/20/2018 1134   Sepsis Labs: @LABRCNTIP (procalcitonin:4,lacticidven:4) ) Recent Results (from the past 240 hour(s))  SARS Coronavirus 2 (CEPHEID- Performed in [facility] lab), Hosp Order     Status: None   Collection Time: 11/20/18 12:29 PM   Specimen:  Nasopharyngeal Swab  Result Value Ref Range Status   SARS Coronavirus 2 NEGATIVE NEGATIVE Final    Comment: (NOTE) If result is NEGATIVE SARS-CoV-2 target nucleic acids are NOT DETECTED. The SARS-CoV-2 RNA is generally detectable in upper and lower  respiratory specimens during the acute phase of infection. The lowest  concentration of SARS-CoV-2 viral copies this assay can detect is 250  copies / mL. A negative result does not preclude SARS-CoV-2 infection  and should not be used as the sole basis for treatment or other  patient management decisions.  A negative result may occur with  improper specimen collection / handling, submission of specimen  other  than nasopharyngeal swab, presence of viral mutation(s) within the  areas targeted by this assay, and inadequate number of viral copies  (<250 copies / mL). A negative result must be combined with clinical  observations, patient history, and epidemiological information. If result is POSITIVE SARS-CoV-2 target nucleic acids are DETECTED. The SARS-CoV-2 RNA is generally detectable in upper and lower  respiratory specimens dur ing the acute phase of infection.  Positive  results are indicative of active infection with SARS-CoV-2.  Clinical  correlation with patient history and other diagnostic information is  necessary to determine patient infection status.  Positive results do  not rule out bacterial infection or co-infection with other viruses. If result is PRESUMPTIVE POSTIVE SARS-CoV-2 nucleic acids MAY BE PRESENT.   A presumptive positive result was obtained on the submitted specimen  and confirmed on repeat testing.  While 2019 novel coronavirus  (SARS-CoV-2) nucleic acids may be present in the submitted sample  additional confirmatory testing may be necessary for epidemiological  and / or clinical management purposes  to differentiate between  SARS-CoV-2 and other Sarbecovirus currently known to infect humans.  If clinically  indicated additional testing with an alternate test  methodology [phone]) is advised. The SARS-CoV-2 RNA is generally  detectable in upper and lower respiratory sp ecimens during the acute  phase of infection. The expected result is Negative. Fact Sheet for Patients:  StrictlyIdeas.no Fact Sheet for Healthcare Providers: BankingDealers.co.za This test is not yet approved or cleared by the [place] FDA and has been authorized for detection and/or diagnosis of SARS-CoV-2 by FDA under an Emergency Use Authorization (EUA).  This EUA will remain in effect (meaning this test can be used) for the duration of the COVID-19 declaration under Section 564(b)(1) of the Act, 21 U.S.C. section 360bbb-3(b)(1), unless the authorization is terminated or revoked sooner. Performed at [facility], [address], [place], [place] [address]   MRSA PCR Screening     Status: Abnormal   Collection Time: 11/21/18  9:56 AM   Specimen: Nasopharyngeal  Result Value Ref Range Status   MRSA by PCR POSITIVE (A) NEGATIVE Final    Comment:        The GeneXpert MRSA Assay (FDA approved for NASAL specimens only), is one component of a comprehensive MRSA colonization surveillance program. It is not intended to diagnose MRSA infection nor to guide or monitor treatment for MRSA infections. RESULT CALLED TO, READ BACK BY AND VERIFIED WITH: BENGTSON P. AT 1115A ON [id number] BY THOMPSON S. Performed at [facility], [address], [place],  [address]      Scheduled Meds: . acetaminophen  650 mg Oral Q6H  . amLODipine  5 mg Oral Daily  . aspirin  81 mg Oral Daily  . enoxaparin (LOVENOX) injection  40 mg Subcutaneous Q24H  . feeding supplement (ENSURE ENLIVE)  237 mL Oral BID BM  . ferrous sulfate  325 mg Oral Q breakfast  . ipratropium-albuterol  3 mL Nebulization BID  . mouth rinse  15 mL Mouth Rinse BID  . pantoprazole  40 mg Oral BID AC  . sodium  chloride  1 g Oral TID WC   Continuous Infusions:  Procedures/Studies: Dg Chest Port 1 View  Result Date: 11/20/2018 CLINICAL DATA:  85 year old female with history of progressive weakness as well as cough and some chest pain. EXAM: PORTABLE CHEST 1 VIEW COMPARISON:  Chest x-ray 10/17/2018. FINDINGS: Patchy ill-defined areas of interstitial prominence and airspace disease in the right lung, most  evident in the right upper lobe and medial aspect of the right lung base, concerning for multilobar pneumonia. Left lung appears clear. No pleural effusions. No evidence of pulmonary edema. Heart size is normal. Upper mediastinal contours are within normal limits. Aortic atherosclerosis. IMPRESSION: 1. Findings are concerning for developing multilobar bronchopneumonia in the right lung, as above. 2. Aortic atherosclerosis. Electronically Signed   By: Vinnie Langton M.D.   On: 11/20/2018 10:41   Dg Femur Min 2 Views Left  Result Date: 11/20/2018 CLINICAL DATA:  LEFT leg pain.  History of prior fracture and ORIF. EXAM: LEFT FEMUR 2 VIEWS COMPARISON:  None. FINDINGS: Plate/wire/screw fixation of the LEFT femur is again noted. There is SUPERIOR position of the remaining LEFT femur in relation to the acetabulum with no discernible femoral head identified. This is unchanged from 06/15/2017. No new abnormalities or acute fractures noted. Degenerative changes in the knee again identified. IMPRESSION: 1. No acute abnormality. No interval change from 06/15/2017. LEFT femur ORIF with SUPERIOR position of the LEFT femur in relation to the acetabulum with no discernible femoral head identified. Electronically Signed   By: Margarette Canada M.D.   On: 11/20/2018 12:32   Dg Femur Min 2 Views Right  Result Date: 11/20/2018 CLINICAL DATA:  RIGHT leg pain.  Initial encounter. EXAM: RIGHT FEMUR 2 VIEWS COMPARISON:  None. FINDINGS: No evidence of fracture or dislocation. No focal bony lesions are present. Severe degenerative changes  in the knee are noted. IMPRESSION: No acute abnormality Electronically Signed   By: Margarette Canada M.D.   On: 11/20/2018 12:29    Orson Eva, DO  [facility] Pager [phone]  If 7PM-7AM, please contact night-coverage [email or web address] Password [email or web address] 11/27/2018, 3:49 PM   LOS: 7 days

## 2018-11-28 LAB — LIPID PANEL
Cholesterol: 159 mg/dL (ref 0–200)
HDL: 61 mg/dL (ref >40)
LDL Cholesterol: 85 mg/dL (ref 0–99)
Total CHOL/HDL Ratio: 2.6 RATIO
Triglycerides: 67 mg/dL (ref <150)
VLDL: 13 mg/dL (ref 0–40)

## 2018-11-28 LAB — BASIC METABOLIC PANEL
Anion gap: 9 (ref 5–15)
BUN: 11 mg/dL (ref 8–23)
CO2: 25 mmol/L (ref 22–32)
Calcium: 8.9 mg/dL (ref 8.9–10.3)
Chloride: 91 mmol/L — ABNORMAL LOW (ref 98–111)
Creatinine, Ser: 0.35 mg/dL — ABNORMAL LOW (ref 0.44–1.00)
GFR calc Af Amer: >60 mL/min (ref >60)
GFR calc non Af Amer: >60 mL/min (ref >60)
Glucose, Bld: 91 mg/dL (ref 70–99)
Potassium: 3.8 mmol/L (ref 3.5–5.1)
Sodium: 125 mmol/L — ABNORMAL LOW (ref 135–145)

## 2018-11-28 LAB — MAGNESIUM: Magnesium: 1.9 mg/dL (ref 1.7–2.4)

## 2018-11-28 LAB — CORTISOL-AM, BLOOD: Cortisol - AM: 11.2 ug/dL (ref 6.7–22.6)

## 2018-11-28 NOTE — Care Management Important Message (Signed)
Important Message  Patient Details  Name: [name] MRN: [id number] Date of Birth: 1934-01-10   Medicare Important Message Given:  Yes     Tommy Medal 11/28/2018, 2:44 PM

## 2018-11-28 NOTE — Progress Notes (Signed)
PROGRESS NOTE  Darthula Desa SNK:[id number] DOB: [birth year] DOA: 11/20/2018 PCP: Medicine, [facility]  Brief History: 85 y.o.femalewho was brought in from home by 1 of her 5 children who cares for her reporting that she has been progressively weaker over the past several weeks and she has developed cough and complained of chest pain. The patient has had poor appetite and not eating and drinking well. The patient has not had a fever. Patientwas a poor historian secondary to her cognitive impairment.. The patient has a history of frequent falls but according to family members has been wheelchair-bound and not walking for the past several months.At the time of admission, chest x-ray showed multilobar airspace disease and interstitial prominence in the right lung. The patient was started on ceftriaxone and azithromycin for pneumonia. She improved clinically, but developed worsening hyponatremia during the hospitalization. 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 The patient was placed on fluid restriction and sodium chloride tablets.  Assessment/Plan: Lobar pneumonia -Personally reviewed chest x-ray--patchy airspace disease bilateral -Finished 5 days of ceftriaxone and azithromycin -Clinically stable on room air  Hyponatremia -Patient has hyponatremia at baseline -Reviewed the medical record showsshe has had Na 129-132, intermittently increasing to 135 -Likely due to poor solute intake -Acute drop in sodium likely attributable to SIADH from the patient's acute medical illness as well as possibly azithromycin -Continue fluid restriction and sodium tablets -Repeat urinalysis, urine sodium -A.m. cortisol level--11.2 -Check lipid panel--LDL 85, triglycerids 67 -Na appears to have reached plateau--consult renal   Essential hypertension -Continue amlodipine -Discontinue lisinopril as this may also contribute to  hyponatremia -Blood pressure controlled  Chronic leg pain -Continue acetaminophen and tramadol  Generalized weakness/deconditioning -PT evaluation--no PT follow up -pt receives 24/7 care at home from family -Urinalysis negative for significant pyuria  GERD -conitnue PPI     Disposition Plan: Home When Na improved/stable--pending renal input Family Communication:Daughter--left VM on phone 8/3  Consultants:none  Code Status: FULL   DVT Prophylaxis: [place] Lovenox   Procedures: As Listed in Progress Note Above  Antibiotics: Ceftriaxone/azithro 7/26-7/30    Subjective: Patient denies fevers, chills, headache, chest pain, dyspnea, nausea, vomiting, diarrhea, abdominal pain, dysuria, hematuria, hematochezia, and melena.   Objective: Vitals:   11/27/18 2151 11/28/18 0541 11/28/18 0741 11/28/18 1540  BP: 118/62 (!) 145/75  (!) 131/59  Pulse: 72 63  69  Resp: 18 19  16   Temp: 98.5 F (36.9 C) 97.8 F (36.6 C)  97.6 F (36.4 C)  TempSrc: Oral Oral  Oral  SpO2: 97% 95% 92% 97%  Weight:      Height:        Intake/Output Summary (Last 24 hours) at 11/28/2018 1700 Last data filed at 11/28/2018 0900 Gross per 24 hour  Intake 360 ml  Output -  Net 360 ml   Weight change:  Exam:   General:  Pt is alert, follows commands appropriately, not in acute distress  HEENT: No icterus, No thrush, No neck mass, [place]/AT  Cardiovascular: RRR, S1/S2, no rubs, no gallops  Respiratory: CTA bilaterally, no wheezing, no crackles, no rhonchi  Abdomen: Soft/+BS, non tender, non distended, no guarding  Extremities: No edema, No lymphangitis, No petechiae, No rashes, no synovitis   Data Reviewed: I have personally reviewed following labs and imaging studies Basic Metabolic Panel: Recent Labs  Lab 11/24/18 0513  11/25/18 9379  11/25/18 2133 11/26/18 0557 11/26/18 1408 11/27/18 0607 11/28/18  6720  NA 120*   < > 119*   < > 123* 126* 124* 126* 125*  K 3.7    < > 3.6   < > 3.5 3.7 3.3* 3.3* 3.8  CL 87*   < > 87*   < > 89* 92* 93* 91* 91*  CO2 23   < > 24   < > 24 25 24 25 25   GLUCOSE 99   < > 95   < > 97 89 147* 91 91  BUN 6*   < > 9   < > 13 11 16 10 11   CREATININE 0.32*   < > 0.39*   < > 0.47 0.40* 0.47 <0.30* 0.35*  CALCIUM 8.9   < > 8.7*   < > 8.7* 9.0 8.8* 9.1 8.9  MG 1.8  --  1.9  --   --  2.0  --  2.0 1.9   < > = values in this interval not displayed.   Liver Function Tests: No results for input(s): AST, ALT, ALKPHOS, BILITOT, PROT, ALBUMIN in the last 168 hours. No results for input(s): LIPASE, AMYLASE in the last 168 hours. No results for input(s): AMMONIA in the last 168 hours. Coagulation Profile: No results for input(s): INR, PROTIME in the last 168 hours. CBC: Recent Labs  Lab 11/24/18 0513 11/25/18 0658 11/26/18 0557  WBC 5.5 4.2 3.1*  HGB 11.1* 10.4* 10.2*  HCT 34.2* 31.5* 30.7*  MCV 83.4 83.8 83.7  PLT 325 321 319   Cardiac Enzymes: No results for input(s): CKTOTAL, CKMB, CKMBINDEX, TROPONINI in the last 168 hours. BNP: Invalid input(s): POCBNP CBG: No results for input(s): GLUCAP in the last 168 hours. HbA1C: No results for input(s): HGBA1C in the last 72 hours. Urine analysis:    Component Value Date/Time   COLORURINE STRAW (A) 11/20/2018 1134   APPEARANCEUR CLEAR 11/20/2018 1134   LABSPEC 1.002 (L) 11/20/2018 1134   PHURINE 8.0 11/20/2018 1134   GLUCOSEU NEGATIVE 11/20/2018 1134   HGBUR SMALL (A) 11/20/2018 1134   BILIRUBINUR NEGATIVE 11/20/2018 1134   KETONESUR NEGATIVE 11/20/2018 1134   PROTEINUR NEGATIVE 11/20/2018 1134   UROBILINOGEN 0.2 06/08/2012 1148   NITRITE NEGATIVE 11/20/2018 1134   LEUKOCYTESUR SMALL (A) 11/20/2018 1134   Sepsis Labs: @LABRCNTIP (procalcitonin:4,lacticidven:4) ) Recent Results (from the past 240 hour(s))  SARS Coronavirus 2 (CEPHEID- Performed in [facility] hospital lab), Hosp Order     Status: None   Collection Time: 11/20/18 12:29 PM   Specimen: Nasopharyngeal  Swab  Result Value Ref Range Status   SARS Coronavirus 2 NEGATIVE NEGATIVE Final    Comment: (NOTE) If result is NEGATIVE SARS-CoV-2 target nucleic acids are NOT DETECTED. The SARS-CoV-2 RNA is generally detectable in upper and lower  respiratory specimens during the acute phase of infection. The lowest  concentration of SARS-CoV-2 viral copies this assay can detect is 250  copies / mL. A negative result does not preclude SARS-CoV-2 infection  and should not be used as the sole basis for treatment or other  patient management decisions.  A negative result may occur with  improper specimen collection / handling, submission of specimen other  than nasopharyngeal swab, presence of viral mutation(s) within the  areas targeted by this assay, and inadequate number of viral copies  (<250 copies / mL). A negative result must be combined with clinical  observations, patient history, and epidemiological information. If result is POSITIVE SARS-CoV-2 target nucleic acids are DETECTED. The SARS-CoV-2 RNA is generally detectable in upper  and lower  respiratory specimens dur ing the acute phase of infection.  Positive  results are indicative of active infection with SARS-CoV-2.  Clinical  correlation with patient history and other diagnostic information is  necessary to determine patient infection status.  Positive results do  not rule out bacterial infection or co-infection with other viruses. If result is PRESUMPTIVE POSTIVE SARS-CoV-2 nucleic acids MAY BE PRESENT.   A presumptive positive result was obtained on the submitted specimen  and confirmed on repeat testing.  While 2019 novel coronavirus  (SARS-CoV-2) nucleic acids may be present in the submitted sample  additional confirmatory testing may be necessary for epidemiological  and / or clinical management purposes  to differentiate between  SARS-CoV-2 and other Sarbecovirus currently known to infect humans.  If clinically indicated  additional testing with an alternate test  methodology [id number]) is advised. The SARS-CoV-2 RNA is generally  detectable in upper and lower respiratory sp ecimens during the acute  phase of infection. The expected result is Negative. Fact Sheet for Patients:  StrictlyIdeas.no Fact Sheet for Healthcare Providers: BankingDealers.co.za This test is not yet approved or cleared by the [place] FDA and has been authorized for detection and/or diagnosis of SARS-CoV-2 by FDA under an Emergency Use Authorization (EUA).  This EUA will remain in effect (meaning this test can be used) for the duration of the COVID-19 declaration under Section 564(b)(1) of the Act, 21 U.S.C. section 360bbb-3(b)(1), unless the authorization is terminated or revoked sooner. Performed at [facility], [address], [place], [place] [address]   MRSA PCR Screening     Status: Abnormal   Collection Time: 11/21/18  9:56 AM   Specimen: Nasopharyngeal  Result Value Ref Range Status   MRSA by PCR POSITIVE (A) NEGATIVE Final    Comment:        The GeneXpert MRSA Assay (FDA approved for NASAL specimens only), is one component of a comprehensive MRSA colonization surveillance program. It is not intended to diagnose MRSA infection nor to guide or monitor treatment for MRSA infections. RESULT CALLED TO, READ BACK BY AND VERIFIED WITH: BENGTSON P. AT 1115A ON [id number] BY THOMPSON S. Performed at [facility], [address] [place], [place] [address]      Scheduled Meds: . acetaminophen  650 mg Oral Q6H  . amLODipine  5 mg Oral Daily  . aspirin  81 mg Oral Daily  . enoxaparin (LOVENOX) injection  40 mg Subcutaneous Q24H  . feeding supplement (ENSURE ENLIVE)  237 mL Oral BID BM  . ferrous sulfate  325 mg Oral Q breakfast  . ipratropium-albuterol  3 mL Nebulization BID  . mouth rinse  15 mL Mouth Rinse BID  . pantoprazole  40 mg Oral BID AC  . sodium chloride  1 g  Oral TID WC   Continuous Infusions:  Procedures/Studies: Dg Chest Port 1 View  Result Date: 11/20/2018 CLINICAL DATA:  85 year old female with history of progressive weakness as well as cough and some chest pain. EXAM: PORTABLE CHEST 1 VIEW COMPARISON:  Chest x-ray 10/17/2018. FINDINGS: Patchy ill-defined areas of interstitial prominence and airspace disease in the right lung, most evident in the right upper lobe and medial aspect of the right lung base, concerning for multilobar pneumonia. Left lung appears clear. No pleural effusions. No evidence of pulmonary edema. Heart size is normal. Upper mediastinal contours are within normal limits. Aortic atherosclerosis. IMPRESSION: 1. Findings are concerning for developing multilobar bronchopneumonia in the right lung, as above. 2. Aortic atherosclerosis.  Electronically Signed   By: Vinnie Langton M.D.   On: 11/20/2018 10:41   Dg Femur Min 2 Views Left  Result Date: 11/20/2018 CLINICAL DATA:  LEFT leg pain.  History of prior fracture and ORIF. EXAM: LEFT FEMUR 2 VIEWS COMPARISON:  None. FINDINGS: Plate/wire/screw fixation of the LEFT femur is again noted. There is SUPERIOR position of the remaining LEFT femur in relation to the acetabulum with no discernible femoral head identified. This is unchanged from 06/15/2017. No new abnormalities or acute fractures noted. Degenerative changes in the knee again identified. IMPRESSION: 1. No acute abnormality. No interval change from 06/15/2017. LEFT femur ORIF with SUPERIOR position of the LEFT femur in relation to the acetabulum with no discernible femoral head identified. Electronically Signed   By: Margarette Canada M.D.   On: 11/20/2018 12:32   Dg Femur Min 2 Views Right  Result Date: 11/20/2018 CLINICAL DATA:  RIGHT leg pain.  Initial encounter. EXAM: RIGHT FEMUR 2 VIEWS COMPARISON:  None. FINDINGS: No evidence of fracture or dislocation. No focal bony lesions are present. Severe degenerative changes in the knee  are noted. IMPRESSION: No acute abnormality Electronically Signed   By: Margarette Canada M.D.   On: 11/20/2018 12:29    Orson Eva, DO  [facility] Pager [phone]  If 7PM-7AM, please contact night-coverage [email or web address] Password [email or web address] 11/28/2018, 5:00 PM   LOS: 8 days

## 2018-11-29 LAB — BASIC METABOLIC PANEL
Anion gap: 10 (ref 5–15)
BUN: 18 mg/dL (ref 8–23)
CO2: 22 mmol/L (ref 22–32)
Calcium: 9 mg/dL (ref 8.9–10.3)
Chloride: 95 mmol/L — ABNORMAL LOW (ref 98–111)
Creatinine, Ser: 0.4 mg/dL — ABNORMAL LOW (ref 0.44–1.00)
GFR calc Af Amer: >60 mL/min (ref >60)
GFR calc non Af Amer: >60 mL/min (ref >60)
Glucose, Bld: 96 mg/dL (ref 70–99)
Potassium: 3.8 mmol/L (ref 3.5–5.1)
Sodium: 127 mmol/L — ABNORMAL LOW (ref 135–145)

## 2018-11-29 LAB — CREATININE, URINE, RANDOM: Creatinine, Urine: 78.85 mg/dL

## 2018-11-29 LAB — OSMOLALITY, URINE: Osmolality, Ur: 611 mOsm/kg (ref 300–900)

## 2018-11-29 LAB — OSMOLALITY: Osmolality: 266 mOsm/kg — ABNORMAL LOW (ref 275–295)

## 2018-11-29 LAB — SODIUM, URINE, RANDOM: Sodium, Ur: 131 mmol/L

## 2018-11-29 MED ORDER — FUROSEMIDE 20 MG PO TABS
10.0000 mg | ORAL_TABLET | Freq: Every day | ORAL | Status: DC
  Administered 2018-11-29 – 2018-12-01 (×2): 10 mg via ORAL
  Filled 2018-11-29 (×2): qty 1

## 2018-11-29 NOTE — Consult Note (Signed)
Reason for Consult: Acute on chronic hyponatremia Referring Physician: Orson Eva, MD [facility])  HPI: 85 year old African-American woman with past medical history significant for hypertension, dementia, frequent UTIs on prophylactic trimethoprim, GERD and nonambulatory status dependent on wheelchair for ambulation.  Brought in to the emergency room about 9 days ago by her child with complaints of progressive weakness and development of cough/mild chest pain with decreased appetite.  Initial evaluation showed multilobar right-sided pneumonia, COVID-19 test negative and hyponatremia with a sodium of 126.  A review of past records shows sodium level 132-135 and she is reports that she has previously had low sodium levels "from getting dehydrated" in the past.  When seen today, she reports that her cough has resolved and her appetite has improved.  She was consequently started on ceftriaxone and azithromycin for pneumonia and is showing clinical improvement but hyponatremia persists.  Her urine osmolality on 7/30 was 499 but surprisingly urine specific gravity on 7/26 was 1.002.  Serum osmolality from today is pending.  A.M. cortisol 11.2 on 8/3 with TSH of 1.8 on 7/26.  Albumin 3.2 on 7/26.  She was started on sodium chloride tablets 3 g/day on 11/24/2018 following earlier normal saline infusion for volume expansion.    11/20/2018  11/23/2018  11/24/2018  11/24/2018  11/25/2018  11/26/2018  11/27/2018 11/28/2018  11/29/2018   Sodium 126 (L) 127 (L) 120 (L) 116 (LL) 120 (L) 126 (L) 126 (L) 125 (L) 127 (L)    Past Medical History:  Diagnosis Date  . Anemia    takes Ferrous Gluconate daily  . Anxiety   . Arthritis    "hands, feet, bad arthritis all over" (03/15/2017)  . Bell's palsy   . Depression   . Fall 03/15/2017   resulting in left hip dislocation and periprostatic femur fracture./notes 03/15/2017  . Falls frequently    "couple times here recently" (03/15/2017)  . GERD (gastroesophageal reflux disease)   .  Heart murmur   . Hypertension    takes Lisinopril and Amlodipine daily  . Left bundle branch block   . Periprosthetic fracture of femur following total replacement of hip    Left  . Pressure injury of sacral region, stage 3 ([place]) 03/21/2017  . Thoracic aortic atherosclerosis ([place]) 03/17/2017  . Type II diabetes mellitus ([place])     Past Surgical History:  Procedure Laterality Date  . APPENDECTOMY    . FRACTURE SURGERY    . HIP ARTHROPLASTY  05/20/2012   Procedure: ARTHROPLASTY BIPOLAR HIP;  Surgeon: Marybelle Killings, MD;  Location: [place];  Service: Orthopedics;  Laterality: Left;  Left monopolar hemiarthroplasty  . ORIF FEMUR FRACTURE Left 03/17/2017   Procedure: REMOVAL OF HEMIARTHROPLASTY, GIRDLESTONE, OPEN REDUCTION INTERNAL FIXATION OF FEMUR FRACTURE, PLATING OF FEMUR FRACTURE;  Surgeon: Marybelle Killings, MD;  Location: [place];  Service: Orthopedics;  Laterality: Left;  . TOTAL HIP REVISION Left 06/08/2012   Procedure: TOTAL HIP REVISION;  Surgeon: Marybelle Killings, MD;  Location: [facility];  Service: Orthopedics;  Laterality: Left;  Revision left hip hemiarthroplasty for periprosthetic fracture  . TUBAL LIGATION      History reviewed. No pertinent family history.  Social History:  reports that she quit smoking about 43 years ago. Her smoking use included cigarettes. She has a 57.00 pack-year smoking history. She has never used smokeless tobacco. She reports that she does not drink alcohol or use drugs.  Allergies: No Known Allergies  Medications:  Scheduled: . acetaminophen  650 mg Oral Q6H  .  amLODipine  5 mg Oral Daily  . aspirin  81 mg Oral Daily  . enoxaparin (LOVENOX) injection  40 mg Subcutaneous Q24H  . feeding supplement (ENSURE ENLIVE)  237 mL Oral BID BM  . ferrous sulfate  325 mg Oral Q breakfast  . ipratropium-albuterol  3 mL Nebulization BID  . mouth rinse  15 mL Mouth Rinse BID  . pantoprazole  40 mg Oral BID AC  . sodium chloride  1 g Oral TID WC    BMP Latest Ref Rng &  Units 11/29/2018 11/28/2018 11/27/2018  Glucose 70 - 99 mg/dL 96 91 91  BUN 8 - 23 mg/dL 18 11 10   Creatinine 0.44 - 1.00 mg/dL 0.40(L) 0.35(L) <0.30(L)  Sodium 135 - 145 mmol/L 127(L) 125(L) 126(L)  Potassium 3.5 - 5.1 mmol/L 3.8 3.8 3.3(L)  Chloride 98 - 111 mmol/L 95(L) 91(L) 91(L)  CO2 22 - 32 mmol/L 22 25 25   Calcium 8.9 - 10.3 mg/dL 9.0 8.9 9.1   CBC Latest Ref Rng & Units 11/26/2018 11/25/2018 11/24/2018  WBC 4.0 - 10.5 K/uL 3.1(L) 4.2 5.5  Hemoglobin 12.0 - 15.0 g/dL 10.2(L) 10.4(L) 11.1(L)  Hematocrit 36.0 - 46.0 % 30.7(L) 31.5(L) 34.2(L)  Platelets 150 - 400 K/uL 319 321 325   Urinalysis    Component Value Date/Time   COLORURINE STRAW (A) 11/20/2018 1134   APPEARANCEUR CLEAR 11/20/2018 1134   LABSPEC 1.002 (L) 11/20/2018 1134   PHURINE 8.0 11/20/2018 1134   GLUCOSEU NEGATIVE 11/20/2018 1134   HGBUR SMALL (A) 11/20/2018 1134   BILIRUBINUR NEGATIVE 11/20/2018 1134   [place] 11/20/2018 1134   PROTEINUR NEGATIVE 11/20/2018 1134   UROBILINOGEN 0.2 06/08/2012 1148   NITRITE NEGATIVE 11/20/2018 1134   LEUKOCYTESUR SMALL (A) 11/20/2018 1134   No results found.  Review of Systems  Constitutional: Positive for malaise/fatigue. Negative for chills, fever and weight loss.  HENT: Negative.   Eyes: Negative.   Respiratory: Negative.   Cardiovascular: Negative.   Gastrointestinal: Negative.   Genitourinary: Negative.   Musculoskeletal: Negative.   Skin: Negative.   Neurological: Negative.    Blood pressure (!) 150/68, pulse 77, temperature 97.8 F (36.6 C), temperature source Oral, resp. rate 20, height 5\' 5"  (1.651 m), weight 49.2 kg, SpO2 94 %. Physical Exam  Nursing note and vitals reviewed. Constitutional: She is oriented to person, place, and time. She appears well-developed. No distress.  Small built/petite  HENT:  Head: Normocephalic and atraumatic.  Mouth/Throat: Oropharynx is clear and moist. No oropharyngeal exudate.  Eyes: Pupils are equal, round, and  reactive to light. EOM are normal. No scleral icterus.  Neck: Normal range of motion. Neck supple. No JVD present.  Cardiovascular: Normal rate, regular rhythm and normal heart sounds.  Respiratory: Effort normal and breath sounds normal. She has no wheezes. She has no rales.  GI: Soft. Bowel sounds are normal. There is no abdominal tenderness. There is no rebound and no guarding.  Musculoskeletal:        General: No edema.  Neurological: She is alert and oriented to person, place, and time.  Skin: Skin is warm and dry. No rash noted.    Assessment/Plan: 1.  Acute on chronic hyponatremia: Euvolemic.  I suspect that she had SIADH with an acute respiratory illness that was compounded by water retention/desalination effect of initial fluids appropriately given for volume expansion.  Her sodium level is now trending up slowly and it is highly likely that she has a reset osmostat and background of poor solute intake (  as is evident by her low BUN/albumin) further compounding her sodium levels.  I will await serum osmolality today but anticipate this is likely going to be appropriately low.  If this is elevated, she will need evaluation for plasma cell dyscrasia otherwise, limited utility with her advanced age/functional status.  I recommend discontinuing her sodium chloride tablets and starting her on low-dose furosemide 10 mg daily.  Recommend complete discontinuation of her ACE inhibitor and transition to alternative antihypertensive therapy-agree with amlodipine as you have currently done.  We discussed nutritional supplementation including drinking Ensure between meals while maintaining fluid restriction <1.5 L a day at home.  No evidence of adrenal insufficiency or hypothyroidism. 2.  Multilobar pneumonia: Status post completion of antibiotic therapy with ceftriaxone and azithromycin. 3.  Hypertension: Agree with discontinuation of lisinopril due to interference with SIADH/sodium regulation.  Recently  started on amlodipine 5 mg daily with anticipated onset of effect in about 5 days of starting this medication.  Monitor with low-dose loop diuretic. 4.  Chronic leg pain/generalized deconditioning: Ongoing pain management with continued care at home.  Traeson Dusza K. 11/29/2018, 8:58 AM

## 2018-11-29 NOTE — Progress Notes (Signed)
Physical Therapy Note  Patient Details  Name: [name] MRN: [id number] Date of Birth: 1934/04/27 Today's Date: 11/29/2018    Pt lying in bed, refused participation with therapy today.  Pt states she is going home today.  [name], PTA/CLT [phone]    Roseanne Reno B 11/29/2018, 3:41 PM

## 2018-11-29 NOTE — Progress Notes (Signed)
PROGRESS NOTE  Rufus Cypert URK:[id number] DOB: October 04, 1933 DOA: 11/20/2018 PCP: [facility] Brief History: 85 y.o.femalewho was brought in from home by 1 of her 5 children who cares for her reporting that she has been progressively weaker over the past several weeks and she has developed cough and complained of chest pain. The patient has had poor appetite and not eating and drinking well. The patient has not had a fever. Patientwas a poor historian secondary to her cognitive impairment.. The patient has a history of frequent falls but according to family members has been wheelchair-bound and not walking for the past several months.At the time of admission, chest x-ray showed multilobar airspace disease and interstitial prominence in the right lung. The patient was started on ceftriaxone and azithromycin for pneumonia. She improved clinically, but developed worsening hyponatremia during the hospitalization. 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 The patient was placed on fluid restriction and sodium chloride tablets.  Assessment/Plan: Lobar pneumonia -Personally reviewed chest x-ray--patchy airspace disease bilateral -Finished 5 days of ceftriaxone and azithromycin -Clinically stable on room air  Hyponatremia -Patient has hyponatremia at baseline -Reviewed the medical record showsshe has had Na 129-132, intermittently increasing to 135 -Likely due to poor solute intake -Acute drop in sodium likely attributable to SIADH from the patient's acute medical illness as well as possibly azithromycin -Continue fluid restriction and sodium tablets -A.m. cortisol level--11.2 -Check lipid panel--LDL 85, triglycerids 67 -Renal consult appreciated-->stop Na tabs, start lasix 10 mg daily  Essential hypertension -Continue amlodipine -Discontinue lisinopril as this may also contribute to hyponatremia -Blood  pressure controlled  Chronic leg pain -Continue acetaminophen and tramadol  Generalized weakness/deconditioning -PT evaluation--no PT follow up -pt receives 24/7 care at home from family -Urinalysis negative for significant pyuria  GERD -conitnue PPI     Disposition Plan: Home 8/5 if Na stable and cleared by renal Family Communication:Daughter--left VM on phone 8/4  Consultants:none  Code Status: FULL   DVT Prophylaxis: [place] Lovenox   Procedures: As Listed in Progress Note Above  Antibiotics: Ceftriaxone/azithro 7/26-7/30    Subjective: Patient denies fevers, chills, headache, chest pain, dyspnea, nausea, vomiting, diarrhea, abdominal pain, dysuria, hematuria, hematochezia, and melena.   Objective: Vitals:   11/28/18 2127 11/29/18 0634 11/29/18 0737 11/29/18 1449  BP: (!) 122/56 (!) 150/68  (!) 187/95  Pulse: 77 77  82  Resp: 16 20  16   Temp: 98.1 F (36.7 C) 97.8 F (36.6 C)  98.2 F (36.8 C)  TempSrc: Oral Oral  Oral  SpO2: 95% 96% 94% 95%  Weight:      Height:        Intake/Output Summary (Last 24 hours) at 11/29/2018 1710 Last data filed at 11/29/2018 1300 Gross per 24 hour  Intake 240 ml  Output 550 ml  Net -310 ml   Weight change:  Exam:   General:  Pt is alert, follows commands appropriately, not in acute distress  HEENT: No icterus, No thrush, No neck mass, [place]/AT  Cardiovascular: RRR, S1/S2, no rubs, no gallops  Respiratory: CTA bilaterally, no wheezing, no crackles, no rhonchi  Abdomen: Soft/+BS, non tender, non distended, no guarding  Extremities: No edema, No lymphangitis, No petechiae, No rashes, no synovitis   Data Reviewed: I have personally reviewed following labs and imaging studies Basic Metabolic Panel: Recent Labs  Lab 11/24/18 0513  11/25/18 8315  11/26/18 0557 11/26/18 1408 11/27/18 0607 11/28/18  4332 11/29/18 0557  NA 120*   < > 119*   < > 126* 124* 126* 125* 127*  K 3.7   < > 3.6   < >  3.7 3.3* 3.3* 3.8 3.8  CL 87*   < > 87*   < > 92* 93* 91* 91* 95*  CO2 23   < > 24   < > 25 24 25 25 22   GLUCOSE 99   < > 95   < > 89 147* 91 91 96  BUN 6*   < > 9   < > 11 16 10 11 18   CREATININE 0.32*   < > 0.39*   < > 0.40* 0.47 <0.30* 0.35* 0.40*  CALCIUM 8.9   < > 8.7*   < > 9.0 8.8* 9.1 8.9 9.0  MG 1.8  --  1.9  --  2.0  --  2.0 1.9  --    < > = values in this interval not displayed.   Liver Function Tests: No results for input(s): AST, ALT, ALKPHOS, BILITOT, PROT, ALBUMIN in the last 168 hours. No results for input(s): LIPASE, AMYLASE in the last 168 hours. No results for input(s): AMMONIA in the last 168 hours. Coagulation Profile: No results for input(s): INR, PROTIME in the last 168 hours. CBC: Recent Labs  Lab 11/24/18 0513 11/25/18 0658 11/26/18 0557  WBC 5.5 4.2 3.1*  HGB 11.1* 10.4* 10.2*  HCT 34.2* 31.5* 30.7*  MCV 83.4 83.8 83.7  PLT 325 321 319   Cardiac Enzymes: No results for input(s): CKTOTAL, CKMB, CKMBINDEX, TROPONINI in the last 168 hours. BNP: Invalid input(s): POCBNP CBG: No results for input(s): GLUCAP in the last 168 hours. HbA1C: No results for input(s): HGBA1C in the last 72 hours. Urine analysis:    Component Value Date/Time   COLORURINE STRAW (A) 11/20/2018 1134   APPEARANCEUR CLEAR 11/20/2018 1134   LABSPEC 1.002 (L) 11/20/2018 1134   PHURINE 8.0 11/20/2018 1134   GLUCOSEU NEGATIVE 11/20/2018 1134   HGBUR SMALL (A) 11/20/2018 1134   BILIRUBINUR NEGATIVE 11/20/2018 1134   KETONESUR NEGATIVE 11/20/2018 1134   PROTEINUR NEGATIVE 11/20/2018 1134   UROBILINOGEN 0.2 06/08/2012 1148   NITRITE NEGATIVE 11/20/2018 1134   LEUKOCYTESUR SMALL (A) 11/20/2018 1134   Sepsis Labs: @LABRCNTIP (procalcitonin:4,lacticidven:4) ) Recent Results (from the past 240 hour(s))  SARS Coronavirus 2 (CEPHEID- Performed in [facility] hospital lab), Hosp Order     Status: None   Collection Time: 11/20/18 12:29 PM   Specimen: Nasopharyngeal Swab  Result  Value Ref Range Status   SARS Coronavirus 2 NEGATIVE NEGATIVE Final    Comment: (NOTE) If result is NEGATIVE SARS-CoV-2 target nucleic acids are NOT DETECTED. The SARS-CoV-2 RNA is generally detectable in upper and lower  respiratory specimens during the acute phase of infection. The lowest  concentration of SARS-CoV-2 viral copies this assay can detect is 250  copies / mL. A negative result does not preclude SARS-CoV-2 infection  and should not be used as the sole basis for treatment or other  patient management decisions.  A negative result may occur with  improper specimen collection / handling, submission of specimen other  than nasopharyngeal swab, presence of viral mutation(s) within the  areas targeted by this assay, and inadequate number of viral copies  (<250 copies / mL). A negative result must be combined with clinical  observations, patient history, and epidemiological information. If result is POSITIVE SARS-CoV-2 target nucleic acids are DETECTED. The SARS-CoV-2 RNA is generally detectable  in upper and lower  respiratory specimens dur ing the acute phase of infection.  Positive  results are indicative of active infection with SARS-CoV-2.  Clinical  correlation with patient history and other diagnostic information is  necessary to determine patient infection status.  Positive results do  not rule out bacterial infection or co-infection with other viruses. If result is PRESUMPTIVE POSTIVE SARS-CoV-2 nucleic acids MAY BE PRESENT.   A presumptive positive result was obtained on the submitted specimen  and confirmed on repeat testing.  While 2019 novel coronavirus  (SARS-CoV-2) nucleic acids may be present in the submitted sample  additional confirmatory testing may be necessary for epidemiological  and / or clinical management purposes  to differentiate between  SARS-CoV-2 and other Sarbecovirus currently known to infect humans.  If clinically indicated additional testing  with an alternate test  methodology [phone]) is advised. The SARS-CoV-2 RNA is generally  detectable in upper and lower respiratory sp ecimens during the acute  phase of infection. The expected result is Negative. Fact Sheet for Patients:  StrictlyIdeas.no Fact Sheet for Healthcare Providers: BankingDealers.co.za This test is not yet approved or cleared by the [place] FDA and has been authorized for detection and/or diagnosis of SARS-CoV-2 by FDA under an Emergency Use Authorization (EUA).  This EUA will remain in effect (meaning this test can be used) for the duration of the COVID-19 declaration under Section 564(b)(1) of the Act, 21 U.S.C. section 360bbb-3(b)(1), unless the authorization is terminated or revoked sooner. Performed at [facility], [address], [place], [place] [address]   MRSA PCR Screening     Status: Abnormal   Collection Time: 11/21/18  9:56 AM   Specimen: Nasopharyngeal  Result Value Ref Range Status   MRSA by PCR POSITIVE (A) NEGATIVE Final    Comment:        The GeneXpert MRSA Assay (FDA approved for NASAL specimens only), is one component of a comprehensive MRSA colonization surveillance program. It is not intended to diagnose MRSA infection nor to guide or monitor treatment for MRSA infections. RESULT CALLED TO, READ BACK BY AND VERIFIED WITH: BENGTSON P. AT 1115A ON [id number] BY THOMPSON S. Performed at [facility], [address] [place], [place] [address]      Scheduled Meds: . acetaminophen  650 mg Oral Q6H  . amLODipine  5 mg Oral Daily  . aspirin  81 mg Oral Daily  . enoxaparin (LOVENOX) injection  40 mg Subcutaneous Q24H  . feeding supplement (ENSURE ENLIVE)  237 mL Oral BID BM  . ferrous sulfate  325 mg Oral Q breakfast  . furosemide  10 mg Oral Daily  . ipratropium-albuterol  3 mL Nebulization BID  . mouth rinse  15 mL Mouth Rinse BID  . pantoprazole  40 mg Oral BID AC    Continuous Infusions:  Procedures/Studies: Dg Chest Port 1 View  Result Date: 11/20/2018 CLINICAL DATA:  85 year old female with history of progressive weakness as well as cough and some chest pain. EXAM: PORTABLE CHEST 1 VIEW COMPARISON:  Chest x-ray 10/17/2018. FINDINGS: Patchy ill-defined areas of interstitial prominence and airspace disease in the right lung, most evident in the right upper lobe and medial aspect of the right lung base, concerning for multilobar pneumonia. Left lung appears clear. No pleural effusions. No evidence of pulmonary edema. Heart size is normal. Upper mediastinal contours are within normal limits. Aortic atherosclerosis. IMPRESSION: 1. Findings are concerning for developing multilobar bronchopneumonia in the right lung, as above. 2. Aortic atherosclerosis.  Electronically Signed   By: Vinnie Langton M.D.   On: 11/20/2018 10:41   Dg Femur Min 2 Views Left  Result Date: 11/20/2018 CLINICAL DATA:  LEFT leg pain.  History of prior fracture and ORIF. EXAM: LEFT FEMUR 2 VIEWS COMPARISON:  None. FINDINGS: Plate/wire/screw fixation of the LEFT femur is again noted. There is SUPERIOR position of the remaining LEFT femur in relation to the acetabulum with no discernible femoral head identified. This is unchanged from 06/15/2017. No new abnormalities or acute fractures noted. Degenerative changes in the knee again identified. IMPRESSION: 1. No acute abnormality. No interval change from 06/15/2017. LEFT femur ORIF with SUPERIOR position of the LEFT femur in relation to the acetabulum with no discernible femoral head identified. Electronically Signed   By: Margarette Canada M.D.   On: 11/20/2018 12:32   Dg Femur Min 2 Views Right  Result Date: 11/20/2018 CLINICAL DATA:  RIGHT leg pain.  Initial encounter. EXAM: RIGHT FEMUR 2 VIEWS COMPARISON:  None. FINDINGS: No evidence of fracture or dislocation. No focal bony lesions are present. Severe degenerative changes in the knee are noted.  IMPRESSION: No acute abnormality Electronically Signed   By: Margarette Canada M.D.   On: 11/20/2018 12:29    Orson Eva, DO  [facility] Pager [phone]  If 7PM-7AM, please contact night-coverage [email or web address] Password [email or web address] 11/29/2018, 5:10 PM   LOS: 9 days

## 2018-11-29 NOTE — Progress Notes (Signed)
Patient is in bed and remains in fetal position and will not allow staff to reposition her. Patient refuses to take medication and refuses her dinner. Patient is alert and oriented x4 and answers questions appropriately. Patient states that she does not want any staff here touching her. Will continue to monitor and reasses patient agitation and willingness to be repositioned.

## 2018-11-30 NOTE — Care Management Important Message (Signed)
Important Message  Patient Details  Name: [name] MRN: [id number] Date of Birth: 1933-09-01   Medicare Important Message Given:  Yes     Tommy Medal 11/30/2018, 2:40 PM

## 2018-11-30 NOTE — Progress Notes (Signed)
Nutrition Follow up  DOCUMENTATION CODES:   Underweight  INTERVENTION:  Ensure Enlive po BID, each supplement provides 350 kcal and 20 grams of protein   Obtain current weight to assess change since admission  NUTRITION DIAGNOSIS:   Inadequate oral intake related to acute illness(pneumonia) as evidenced by (report of poor appeitte and intake PTA and Labs: clincally dehydrated on admission).   GOAL:  Patient will meet greater than or equal to 90% of their needs  MONITOR:  PO intake, Supplement acceptance, Labs, Weight trends, Skin   ASSESSMENT: Patient is an underweight 85 yo female with dementia, Anemia, GERD, Hypertension, Type 2 diabetes and HTN. Patient says she has not walked in approximately 2 years.   Patient says she had not been eating well and drinking as much as usual. Her children help daily with her care but she is able to feed herself. Dehydrated on admission but patient unable to provide detailed nutrition intake history.   8/5-f/u Meal intake: on 8/2  75-100%, 8/3-50%, 8/4-75% of breakfast and lunch, 0% dinner. Today- 0% and nursing is having difficulty with getting pt to take her medicine. She continues on a 1200 ml fluid restriction.   Patient weight history is stable the past 15 months between 48-49 kg range. Need an updated weight to assess change.   Medications reviewed and include: ferrous sulfate, Protonix, rocephin and zithromax.   Labs: hyponatremia-127 (L) BMP Latest Ref Rng & Units 11/29/2018 11/28/2018 11/27/2018  Glucose 70 - 99 mg/dL 96 91 91  BUN 8 - 23 mg/dL 18 11 10   Creatinine 0.44 - 1.00 mg/dL 0.40(L) 0.35(L) <0.30(L)  Sodium 135 - 145 mmol/L 127(L) 125(L) 126(L)  Potassium 3.5 - 5.1 mmol/L 3.8 3.8 3.3(L)  Chloride 98 - 111 mmol/L 95(L) 91(L) 91(L)  CO2 22 - 32 mmol/L 22 25 25   Calcium 8.9 - 10.3 mg/dL 9.0 8.9 9.1     NUTRITION - FOCUSED PHYSICAL EXAM: Unable to complete Nutrition-Focused physical exam at this time.    Diet Order:   Diet  Order            DIET SOFT Room service appropriate? Yes; Fluid consistency: Thin; Fluid restriction: 1200 mL Fluid  Diet effective now              EDUCATION NEEDS:  Not appropriate for education at this time    Skin:  Skin Assessment: Reviewed RN Assessment  Last BM:  8/2  Height:   Ht Readings from Last 1 Encounters:  11/20/18 5\' 5"  (1.651 m)    Weight:   Wt Readings from Last 1 Encounters:  11/20/18 49.2 kg    Ideal Body Weight:  57 kg  BMI:  Body mass index is 18.04 kg/m.  Estimated Nutritional Needs:   Kcal:  1470-1617 (30-33 kcal/kg/bw) to prevent wt loss  Protein:  59-64 gr  Fluid:  1500 ml daily   Colman Cater MS,RD,CSG,LDN Office: [phone] Pager: [phone]

## 2018-11-30 NOTE — Progress Notes (Signed)
Patient ID: [name], female   DOB: [birth year], 85 y.o.   MRN: [id number] S:Per her nurse, Ms. [name] is refusing all medications, labs, or interventions.  She is not speaking and refuses to answer questions.  She is resting in bed comfortably. O:BP (!) 143/60   Pulse 90   Temp 98 F (36.7 C)   Resp 15   Ht 5\' 5"  (1.651 m)   Wt 49.2 kg   SpO2 93%   BMI 18.04 kg/m   Intake/Output Summary (Last 24 hours) at 11/30/2018 0919 Last data filed at 11/30/2018 0418 Gross per 24 hour  Intake 240 ml  Output 1400 ml  Net -1160 ml   Intake/Output: I/O last 3 completed shifts: In: 240 [P.O.:240] Out: 1950 [Urine:1950]  Intake/Output this shift:  No intake/output data recorded. Weight change:  Gen: NAD CVS: no rub Resp: CTA Abd: benign Ext: no edema  Recent Labs  Lab 11/25/18 1819 11/25/18 2133 11/26/18 0557 11/26/18 1408 11/27/18 0607 11/28/18 0623 11/29/18 0557  NA 121* 123* 126* 124* 126* 125* 127*  K 3.4* 3.5 3.7 3.3* 3.3* 3.8 3.8  CL 91* 89* 92* 93* 91* 91* 95*  CO2 22 24 25 24 25 25 22   GLUCOSE 134* 97 89 147* 91 91 96  BUN 15 13 11 16 10 11 18   CREATININE 0.48 0.47 0.40* 0.47 <0.30* 0.35* 0.40*  CALCIUM 8.4* 8.7* 9.0 8.8* 9.1 8.9 9.0   Liver Function Tests: No results for input(s): AST, ALT, ALKPHOS, BILITOT, PROT, ALBUMIN in the last 168 hours. No results for input(s): LIPASE, AMYLASE in the last 168 hours. No results for input(s): AMMONIA in the last 168 hours. CBC: Recent Labs  Lab 11/24/18 0513 11/25/18 0658 11/26/18 0557  WBC 5.5 4.2 3.1*  HGB 11.1* 10.4* 10.2*  HCT 34.2* 31.5* 30.7*  MCV 83.4 83.8 83.7  PLT 325 321 319   Cardiac Enzymes: No results for input(s): CKTOTAL, CKMB, CKMBINDEX, TROPONINI in the last 168 hours. CBG: No results for input(s): GLUCAP in the last 168 hours.  Iron Studies: No results for input(s): IRON, TIBC, TRANSFERRIN, FERRITIN in the last 72 hours. Studies/Results: No results found. Marland Kitchen acetaminophen  650 mg Oral Q6H  .  amLODipine  5 mg Oral Daily  . aspirin  81 mg Oral Daily  . enoxaparin (LOVENOX) injection  40 mg Subcutaneous Q24H  . feeding supplement (ENSURE ENLIVE)  237 mL Oral BID BM  . ferrous sulfate  325 mg Oral Q breakfast  . furosemide  10 mg Oral Daily  . ipratropium-albuterol  3 mL Nebulization BID  . mouth rinse  15 mL Mouth Rinse BID  . pantoprazole  40 mg Oral BID AC    BMET    Component Value Date/Time   NA 127 (L) 11/29/2018 0557   K 3.8 11/29/2018 0557   CL 95 (L) 11/29/2018 0557   CO2 22 11/29/2018 0557   GLUCOSE 96 11/29/2018 0557   BUN 18 11/29/2018 0557   CREATININE 0.40 (L) 11/29/2018 0557   CALCIUM 9.0 11/29/2018 0557   GFRNONAA >60 11/29/2018 0557   GFRAA >60 11/29/2018 0557   CBC    Component Value Date/Time   WBC 3.1 (L) 11/26/2018 0557   RBC 3.67 (L) 11/26/2018 0557   HGB 10.2 (L) 11/26/2018 0557   HCT 30.7 (L) 11/26/2018 0557   PLT 319 11/26/2018 0557   MCV 83.7 11/26/2018 0557   MCH 27.8 11/26/2018 0557   MCHC 33.2 11/26/2018 0557   RDW 12.6 11/26/2018 0557  LYMPHSABS 0.6 (L) 11/20/2018 1016   MONOABS 0.9 11/20/2018 1016   EOSABS 0.3 11/20/2018 1016   BASOSABS 0.0 11/20/2018 1016     Assessment/Plan:  1. Acute on chronic hyponatremia-   Pt is euvolemic, with high urine Na and inappropriately high urine osmolality consistent with SIADH in setting of PNA and her sodium had been improving, however she is currently refusing lab draws or any intervention.  Not sure what else to add as her current sodium level is unknown.  Continue with fluid restriction for now as well as low dose furosemide and see if her family can persuade her to cooperate with our care. 2. Multilobar PNA- completed abx 3. HTN- agree with discontinuing lisinopril and cont with amlodipine and furosemide. 4. Dementia- pt currently refusing to speak or participate in her care.  Need to call her family to help persuade her to cooperate or discuss goal/limits of care as she is a full code at  the present time.  Connie Potts, MD [employer] [phone]

## 2018-11-30 NOTE — Progress Notes (Addendum)
Refusing medications.  Will not answer any questions.  Did allow care for incontinent bm.  Slid up in bed and encouraged to eat and drink and stated "you can't make me eat or drink" and is tearful.  Reached daughters voice mail and left message for her to call back. .  Sacral dressing placed.  Daughter called back and said that patient reacts this way when she doesn't get her way. Daughter stated that patient thought she was going home and when she found out she wasn't she became uipset.

## 2018-11-30 NOTE — Progress Notes (Signed)
PROGRESS NOTE  Kaylen Motl OZY:[id number] DOB: 04-07-1934 DOA: 11/20/2018 PCP: [facility], [facility] Brief History: 85 y.o.femalewho was brought in from home by 1 of her 5 children who cares for her reporting that she has been progressively weaker over the past several weeks and she has developed cough and complained of chest pain. The patient has had poor appetite and not eating and drinking well. The patient has not had a fever. Patientwas a poor historian secondary to her cognitive impairment.. The patient has a history of frequent falls but according to family members has been wheelchair-bound and not walking for the past several months.At the time of admission, chest x-ray showed multilobar airspace disease and interstitial prominence in the right lung. The patient was started on ceftriaxone and azithromycin for pneumonia. She improved clinically, but developed worsening hyponatremia during the hospitalization. 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 The patient was placed on fluid restriction and sodium chloride tablets.  Assessment/Plan: Lobar pneumonia -Personally reviewed chest x-ray--patchy airspace disease bilateral -Finished 5 days of ceftriaxone and azithromycin -Clinically stable on room air  Hyponatremia -Patient has hyponatremia at baseline -Reviewed the medical record showsshe has had Na 129-132, intermittently increasing to 135 -Likely due to poor solute intake -Acute drop in sodium likely attributable to SIADH from the patient's acute medical illness as well as possibly azithromycin -Continue fluid restriction and sodium tablets -A.m. cortisol level--11.2 -Check lipid panel--LDL 85, triglycerids 67 -Renal consult appreciated-->stop Na tabs, start lasix 10 mg daily -Repeat labs in a.m.  Essential hypertension -Continue amlodipine -Discontinue lisinopril as this may also contribute to  hyponatremia -Blood pressure controlled  Chronic leg pain -Continue acetaminophen and tramadol  Generalized weakness/deconditioning -PT evaluation--no PT follow up -pt receives 24/7 care at home from family -Urinalysis negative for significant pyuria  GERD -conitnue PPI  Dementia -Patient is refusing all blood draws, medications are to receive care.  She has not had any p.o. intake today.  Discussed with her daughter who says that she occasionally will have episodes like this time the last few days where she refuses everything.  I have asked the family to come in to help convince her to continue with care.  Will also ask palliative care to see to help address further goals of care.     Disposition Plan: Home 8/6 if Na stable and cleared by renal Family Communication:Discussed with daughter Enzo Bi over the phone 8/5  Consultants:Nephrology  Code Status: FULL   DVT Prophylaxis:  Lovenox   Procedures: As Listed in Progress Note Above  Antibiotics: Ceftriaxone/azithro 7/26-7/30    Subjective: Patient is medication conversation, she is laying in bed, has refused blood draws and medications today.  Has not had any p.o. intake today.   Objective: Vitals:   11/29/18 0737 11/29/18 1449 11/29/18 2015 11/29/18 2134  BP:  (!) 187/95  (!) 143/60  Pulse:  82 80 90  Resp:  16 16 15   Temp:  98.2 F (36.8 C)  98 F (36.7 C)  TempSrc:  Oral    SpO2: 94% 95% 94% 93%  Weight:      Height:        Intake/Output Summary (Last 24 hours) at 11/30/2018 1844 Last data filed at 11/30/2018 1843 Gross per 24 hour  Intake 0 ml  Output 1400 ml  Net -1400 ml   Weight change:  Exam:  General exam: Alert, awake, no distress Respiratory  system: Clear to auscultation. Respiratory effort normal. Cardiovascular system:RRR. No murmurs, rubs, gallops. Gastrointestinal system: Abdomen is nondistended, soft and nontender. No organomegaly or masses felt. Normal  bowel sounds heard. Central nervous system: No focal neurological deficits. Extremities: No C/C/E, +pedal pulses Skin: No rashes, lesions or ulcers  Psychiatry: Does not engage in conversation    Data Reviewed: I have personally reviewed following labs and imaging studies Basic Metabolic Panel: Recent Labs  Lab 11/24/18 0513  11/25/18 0658  11/26/18 0557 11/26/18 1408 11/27/18 0607 11/28/18 0623 11/29/18 0557  NA 120*   < > 119*   < > 126* 124* 126* 125* 127*  K 3.7   < > 3.6   < > 3.7 3.3* 3.3* 3.8 3.8  CL 87*   < > 87*   < > 92* 93* 91* 91* 95*  CO2 23   < > 24   < > 25 24 25 25 22   GLUCOSE 99   < > 95   < > 89 147* 91 91 96  BUN 6*   < > 9   < > 11 16 10 11 18   CREATININE 0.32*   < > 0.39*   < > 0.40* 0.47 <0.30* 0.35* 0.40*  CALCIUM 8.9   < > 8.7*   < > 9.0 8.8* 9.1 8.9 9.0  MG 1.8  --  1.9  --  2.0  --  2.0 1.9  --    < > = values in this interval not displayed.   Liver Function Tests: No results for input(s): AST, ALT, ALKPHOS, BILITOT, PROT, ALBUMIN in the last 168 hours. No results for input(s): LIPASE, AMYLASE in the last 168 hours. No results for input(s): AMMONIA in the last 168 hours. Coagulation Profile: No results for input(s): INR, PROTIME in the last 168 hours. CBC: Recent Labs  Lab 11/24/18 0513 11/25/18 0658 11/26/18 0557  WBC 5.5 4.2 3.1*  HGB 11.1* 10.4* 10.2*  HCT 34.2* 31.5* 30.7*  MCV 83.4 83.8 83.7  PLT 325 321 319   Cardiac Enzymes: No results for input(s): CKTOTAL, CKMB, CKMBINDEX, TROPONINI in the last 168 hours. BNP: Invalid input(s): POCBNP CBG: No results for input(s): GLUCAP in the last 168 hours. HbA1C: No results for input(s): HGBA1C in the last 72 hours. Urine analysis:    Component Value Date/Time   COLORURINE STRAW (A) 11/20/2018 1134   APPEARANCEUR CLEAR 11/20/2018 1134   LABSPEC 1.002 (L) 11/20/2018 1134   PHURINE 8.0 11/20/2018 1134   GLUCOSEU NEGATIVE 11/20/2018 1134   HGBUR SMALL (A) 11/20/2018 1134    BILIRUBINUR NEGATIVE 11/20/2018 1134   KETONESUR NEGATIVE 11/20/2018 1134   PROTEINUR NEGATIVE 11/20/2018 1134   UROBILINOGEN 0.2 06/08/2012 1148   NITRITE NEGATIVE 11/20/2018 1134   LEUKOCYTESUR SMALL (A) 11/20/2018 1134   Sepsis Labs: @LABRCNTIP (procalcitonin:4,lacticidven:4) ) Recent Results (from the past 240 hour(s))  MRSA PCR Screening     Status: Abnormal   Collection Time: 11/21/18  9:56 AM   Specimen: Nasopharyngeal  Result Value Ref Range Status   MRSA by PCR POSITIVE (A) NEGATIVE Final    Comment:        The GeneXpert MRSA Assay (FDA approved for NASAL specimens only), is one component of a comprehensive MRSA colonization surveillance program. It is not intended to diagnose MRSA infection nor to guide or monitor treatment for MRSA infections. RESULT CALLED TO, READ BACK BY AND VERIFIED WITH: BENGTSON P. AT 1115A ON [id number] BY THOMPSON S. Performed at [facility], [place]  [address] [place], [place] [address]      Scheduled Meds:  acetaminophen  650 mg Oral Q6H   amLODipine  5 mg Oral Daily   aspirin  81 mg Oral Daily   enoxaparin (LOVENOX) injection  40 mg Subcutaneous Q24H   feeding supplement (ENSURE ENLIVE)  237 mL Oral BID BM   ferrous sulfate  325 mg Oral Q breakfast   furosemide  10 mg Oral Daily   ipratropium-albuterol  3 mL Nebulization BID   mouth rinse  15 mL Mouth Rinse BID   pantoprazole  40 mg Oral BID AC   Continuous Infusions:  Procedures/Studies: Dg Chest Port 1 View  Result Date: 11/20/2018 CLINICAL DATA:  85 year old female with history of progressive weakness as well as cough and some chest pain. EXAM: PORTABLE CHEST 1 VIEW COMPARISON:  Chest x-ray 10/17/2018. FINDINGS: Patchy ill-defined areas of interstitial prominence and airspace disease in the right lung, most evident in the right upper lobe and medial aspect of the right lung base, concerning for multilobar pneumonia. Left lung appears clear. No pleural effusions. No  evidence of pulmonary edema. Heart size is normal. Upper mediastinal contours are within normal limits. Aortic atherosclerosis. IMPRESSION: 1. Findings are concerning for developing multilobar bronchopneumonia in the right lung, as above. 2. Aortic atherosclerosis. Electronically Signed   By: Vinnie Langton M.D.   On: 11/20/2018 10:41   Dg Femur Min 2 Views Left  Result Date: 11/20/2018 CLINICAL DATA:  LEFT leg pain.  History of prior fracture and ORIF. EXAM: LEFT FEMUR 2 VIEWS COMPARISON:  None. FINDINGS: Plate/wire/screw fixation of the LEFT femur is again noted. There is SUPERIOR position of the remaining LEFT femur in relation to the acetabulum with no discernible femoral head identified. This is unchanged from 06/15/2017. No new abnormalities or acute fractures noted. Degenerative changes in the knee again identified. IMPRESSION: 1. No acute abnormality. No interval change from 06/15/2017. LEFT femur ORIF with SUPERIOR position of the LEFT femur in relation to the acetabulum with no discernible femoral head identified. Electronically Signed   By: Margarette Canada M.D.   On: 11/20/2018 12:32   Dg Femur Min 2 Views Right  Result Date: 11/20/2018 CLINICAL DATA:  RIGHT leg pain.  Initial encounter. EXAM: RIGHT FEMUR 2 VIEWS COMPARISON:  None. FINDINGS: No evidence of fracture or dislocation. No focal bony lesions are present. Severe degenerative changes in the knee are noted. IMPRESSION: No acute abnormality Electronically Signed   By: Margarette Canada M.D.   On: 11/20/2018 12:29    Kathie Dike, MD  [facility]  If 7PM-7AM, please contact night-coverage [email or web address]  11/30/2018, 6:44 PM   LOS: 10 days

## 2018-12-01 ENCOUNTER — Encounter (HOSPITAL_COMMUNITY): Payer: Self-pay | Admitting: Primary Care

## 2018-12-01 DIAGNOSIS — Z7189 Other specified counseling: Secondary | ICD-10-CM

## 2018-12-01 DIAGNOSIS — Z515 Encounter for palliative care: Secondary | ICD-10-CM

## 2018-12-01 LAB — RENAL FUNCTION PANEL
Albumin: 3.2 g/dL — ABNORMAL LOW (ref 3.5–5.0)
Anion gap: 10 (ref 5–15)
BUN: 18 mg/dL (ref 8–23)
CO2: 22 mmol/L (ref 22–32)
Calcium: 9.1 mg/dL (ref 8.9–10.3)
Chloride: 94 mmol/L — ABNORMAL LOW (ref 98–111)
Creatinine, Ser: 0.44 mg/dL (ref 0.44–1.00)
GFR calc Af Amer: >60 mL/min (ref >60)
GFR calc non Af Amer: >60 mL/min (ref >60)
Glucose, Bld: 83 mg/dL (ref 70–99)
Phosphorus: 3.8 mg/dL (ref 2.5–4.6)
Potassium: 3.8 mmol/L (ref 3.5–5.1)
Sodium: 126 mmol/L — ABNORMAL LOW (ref 135–145)

## 2018-12-01 MED ORDER — FUROSEMIDE 20 MG PO TABS: 10.0000 mg | ORAL_TABLET | Freq: Every day | ORAL | 1 refills | Status: DC

## 2018-12-01 MED ORDER — ENSURE ENLIVE PO LIQD: 237.0000 mL | Freq: Two times a day (BID) | ORAL | 12 refills | Status: DC

## 2018-12-01 MED ORDER — AMLODIPINE BESYLATE 2.5 MG PO TABS: 2.5000 mg | ORAL_TABLET | Freq: Every day | ORAL | 1 refills | Status: DC

## 2018-12-01 MED ORDER — ASPIRIN 81 MG PO CHEW: 81.0000 mg | CHEWABLE_TABLET | Freq: Every day | ORAL | 1 refills | Status: DC

## 2018-12-01 NOTE — Progress Notes (Signed)
Patient ID: [name], female   DOB: 08/28/1933, 85 y.o.   MRN: [id number] S:Much more awake, alert, and cooperative today.  Feels well and wants to go home O:BP (!) 122/57 (BP Location: Left Arm)   Pulse 87   Temp 97.9 F (36.6 C) (Oral)   Resp 16   Ht 5\' 5"  (1.651 m)   Wt 49.2 kg   SpO2 99%   BMI 18.04 kg/m   Intake/Output Summary (Last 24 hours) at 12/01/2018 1019 Last data filed at 12/01/2018 0911 Gross per 24 hour  Intake 240 ml  Output 400 ml  Net -160 ml   Intake/Output: I/O last 3 completed shifts: In: 0  Out: 800 [Urine:800]  Intake/Output this shift:  Total I/O In: 240 [P.O.:240] Out: -  Weight change:  Gen: Frail elderly AAF in NAD CVS: no rub, III/VI SEM at LUSB Resp: cta Abd: benign Ext: no edema  Recent Labs  Lab 11/25/18 2133 11/26/18 0557 11/26/18 1408 11/27/18 0607 11/28/18 0623 11/29/18 0557 12/01/18 0500  NA 123* 126* 124* 126* 125* 127* 126*  K 3.5 3.7 3.3* 3.3* 3.8 3.8 3.8  CL 89* 92* 93* 91* 91* 95* 94*  CO2 24 25 24 25 25 22 22   GLUCOSE 97 89 147* 91 91 96 83  BUN 13 11 16 10 11 18 18   CREATININE 0.47 0.40* 0.47 <0.30* 0.35* 0.40* 0.44  ALBUMIN  --   --   --   --   --   --  3.2*  CALCIUM 8.7* 9.0 8.8* 9.1 8.9 9.0 9.1  PHOS  --   --   --   --   --   --  3.8   Liver Function Tests: Recent Labs  Lab 12/01/18 0500  ALBUMIN 3.2*   No results for input(s): LIPASE, AMYLASE in the last 168 hours. No results for input(s): AMMONIA in the last 168 hours. CBC: Recent Labs  Lab 11/25/18 0658 11/26/18 0557  WBC 4.2 3.1*  HGB 10.4* 10.2*  HCT 31.5* 30.7*  MCV 83.8 83.7  PLT 321 319   Cardiac Enzymes: No results for input(s): CKTOTAL, CKMB, CKMBINDEX, TROPONINI in the last 168 hours. CBG: No results for input(s): GLUCAP in the last 168 hours.  Iron Studies: No results for input(s): IRON, TIBC, TRANSFERRIN, FERRITIN in the last 72 hours. Studies/Results: No results found. Marland Kitchen acetaminophen  650 mg Oral Q6H  . amLODipine  5 mg Oral  Daily  . aspirin  81 mg Oral Daily  . enoxaparin (LOVENOX) injection  40 mg Subcutaneous Q24H  . feeding supplement (ENSURE ENLIVE)  237 mL Oral BID BM  . ferrous sulfate  325 mg Oral Q breakfast  . furosemide  10 mg Oral Daily  . mouth rinse  15 mL Mouth Rinse BID  . pantoprazole  40 mg Oral BID AC    BMET    Component Value Date/Time   NA 126 (L) 12/01/2018 0500   K 3.8 12/01/2018 0500   CL 94 (L) 12/01/2018 0500   CO2 22 12/01/2018 0500   GLUCOSE 83 12/01/2018 0500   BUN 18 12/01/2018 0500   CREATININE 0.44 12/01/2018 0500   CALCIUM 9.1 12/01/2018 0500   GFRNONAA >60 12/01/2018 0500   GFRAA >60 12/01/2018 0500   CBC    Component Value Date/Time   WBC 3.1 (L) 11/26/2018 0557   RBC 3.67 (L) 11/26/2018 0557   HGB 10.2 (L) 11/26/2018 0557   HCT 30.7 (L) 11/26/2018 0557   PLT 319 11/26/2018  0557   MCV 83.7 11/26/2018 0557   MCH 27.8 11/26/2018 0557   MCHC 33.2 11/26/2018 0557   RDW 12.6 11/26/2018 0557   LYMPHSABS 0.6 (L) 11/20/2018 1016   MONOABS 0.9 11/20/2018 1016   EOSABS 0.3 11/20/2018 1016   BASOSABS 0.0 11/20/2018 1016    Assessment/Plan:  1. Acute on chronic hyponatremia-   Pt is euvolemic, with high urine Na and inappropriately high urine osmolality consistent with SIADH in setting of PNA and her sodium had been improving, and now stabilized near her baseline 1. Continue with fluid restriction of 1 liter/day as well as low dose furosemide and see if her family can persuade her to cooperate with our care. 2. Follow up with PCP for recheck of sodium levels 2. Multilobar PNA- completed abx 3. HTN- agree with discontinuing lisinopril and cont with amlodipine and furosemide. 4. Dementia- pt currently refusing to speak or participate in her care.  Need to call her family to help persuade her to cooperate or discuss goal/limits of care as she is a full code at the present time. 5. Disposition- stable for discharge from our standpoint.  Donetta Potts,  MD [employer] [phone]

## 2018-12-01 NOTE — Discharge Summary (Signed)
[name], is a 85 y.o. female  DOB 08-16-1933  MRN [id number].  Admission date:  11/20/2018  Admitting Physician  Murlean Iba, MD  Discharge Date:  12/01/2018   Primary MD  Medicine, [facility]  Recommendations for primary care physician for things to follow:   1) limit free water intake to less than 30 ounces (900 ml/day), limited total Fluid intake to less than 60 ounces (1.8 L/day) 2)Weigh yourself daily, call if you gain more than 3 pounds in 1 day or more than 5 pounds in 1 week as your diuretic medications may need to be adjusted 3) follow-up with your primary care physician for repeat BMP blood test in 1 to 2 weeks   Admission Diagnosis  Weakness [R53.1] Community acquired pneumonia of right lung, unspecified part of lung [J18.9]   Discharge Diagnosis  Weakness [R53.1] Community acquired pneumonia of right lung, unspecified part of lung [J18.9]    Principal Problem:   Right lower lobe pneumonia ([place]) Active Problems:   Hypertension   Hyponatremia   Anemia, chronic disease   GERD (gastroesophageal reflux disease)   Does not walk - Wheelchair bound   Generalized weakness   Lobar pneumonia ([place])      Past Medical History:  Diagnosis Date   Anemia    takes Ferrous Gluconate daily   Anxiety    Arthritis    "hands, feet, bad arthritis all over" (03/15/2017)   Bell's palsy    Depression    Fall 03/15/2017   resulting in left hip dislocation and periprostatic femur fracture./notes 03/15/2017   Falls frequently    "couple times here recently" (03/15/2017)   GERD (gastroesophageal reflux disease)    Heart murmur    Hypertension    takes Lisinopril and Amlodipine daily   Left bundle branch block    Periprosthetic fracture of femur following total replacement of hip    Left   Pressure injury of sacral region, stage 3 ([place]) 03/21/2017   Thoracic aortic  atherosclerosis ([place]) 03/17/2017   Type II diabetes mellitus ([place])     Past Surgical History:  Procedure Laterality Date   APPENDECTOMY     FRACTURE SURGERY     HIP ARTHROPLASTY  05/20/2012   Procedure: ARTHROPLASTY BIPOLAR HIP;  Surgeon: Marybelle Killings, MD;  Location: [place];  Service: Orthopedics;  Laterality: Left;  Left monopolar hemiarthroplasty   ORIF FEMUR FRACTURE Left 03/17/2017   Procedure: REMOVAL OF HEMIARTHROPLASTY, GIRDLESTONE, OPEN REDUCTION INTERNAL FIXATION OF FEMUR FRACTURE, PLATING OF FEMUR FRACTURE;  Surgeon: Marybelle Killings, MD;  Location: [facility];  Service: Orthopedics;  Laterality: Left;   TOTAL HIP REVISION Left 06/08/2012   Procedure: TOTAL HIP REVISION;  Surgeon: Marybelle Killings, MD;  Location: [facility];  Service: Orthopedics;  Laterality: Left;  Revision left hip hemiarthroplasty for periprosthetic fracture   TUBAL LIGATION         HPI  from the history and physical done on the day of admission:   HPI: [name] is a 85 y.o.  female who was brought in from home by 1 of her 5 children who cares for her reporting that she has been progressively weaker over the past several weeks and she has developed cough and complained of chest pain.  The patient has had poor appetite and not eating and drinking well.  The patient has not had a fever.  Patient has dementia.  The patient has a history of frequent falls but according to family members has been wheelchair-bound and not walking for the past several months.  The patient has a history of frequent UTIs and is on trimethoprim daily for prophylaxis.  The patient has a history of depression and anxiety.  The patient has GERD and hypertension and is followed by primary care for this.  The patient has had hip fractures and repair and left femoral plating.  The patient has chronic femur pain.  ED course: The patient was noted to be dehydrated clinically and hyponatremic with a sodium of 126.  The patient had chest x-ray findings of a  multifocal right pneumonia.  Patient had a respiratory rate of 34 and blood pressure of 163/86.  Patient had a 94% oxygen saturation on room air.  Her troponin was 10.  Her glucose was 92.  Her SARS 2 COVID-19 test is pending her urinalysis showed many bacteria 6-10 WBC per high-power field.  TSH was 1.835.  Hemoglobin 10.4.  Platelet count 308.  Patient is being admitted for treatment of pneumonia and dehydration with hyponatremia and associated generalized weakness.      Hospital Course:   Brief History: 85 y.o.femalewho was brought in from home by 1 of her 5 children who cares for her reporting that she has been progressively weaker over the past several weeks and she has developed cough and complained of chest pain. The patient has had poor appetite and not eating and drinking well. The patient has not had a fever. Patientwas a poor historian secondary to her cognitive impairment.. The patient has a history of frequent falls but according to family members has been wheelchair-bound and not walking for the past several months.At the time of admission, chest x-ray showed multilobar airspace disease and interstitial prominence in the right lung. The patient was started on ceftriaxone and azithromycin for pneumonia. She improved clinically, but developed worsening hyponatremia during the hospitalization. 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 The patient was placed on fluid restriction and sodium chloride tablets.  Assessment/Plan: Lobar pneumonia -Clinically improved, no hypoxia, patient completed azithromycin and Rocephin for bilateral lobar pneumonia   Hyponatremia -Patient has hyponatremia at baseline --Sodium is up to 127 at the time of discharge -Nephrology input appreciated -Suspect some component of SIADH in the setting of pneumonia --A.m. cortisol level--11.2 - lipid panel--LDL 85, triglycerids 67 -Stop trimethoprim  as this may contribute to hyponatremia  Essential hypertension -Continue amlodipine --Blood pressure controlled  Chronic leg pain -Continue acetaminophen  Generalized weakness/deconditioning -PT evaluation--no PT follow up -pt receives 24/7 care at home from family -Urinalysis negative for significant pyuria  GERD -conitnue PPI  Dementia -Behavioral issues improved, patient becoming more cooperative, eating and drinking better, family comfortable with patient being discharged at this time   Disposition Plan: Home 8/6 if Na stable and cleared by renal Family Communication:Discussed with daughter Enzo Bi over the phone   Consultants:Nephrology  Code Status: FULL    Discharge Condition: stable,   Follow UP   Discussed with Daughter Ms Danasha Melman- [phone]  Diet and Activity recommendation:  As advised  Discharge Instructions    Discharge Instructions    Call MD for:  difficulty breathing, headache or visual disturbances   Complete by: As directed    Call MD for:  persistant dizziness or light-headedness   Complete by: As directed    Call MD for:  persistant nausea and vomiting   Complete by: As directed    Call MD for:  severe uncontrolled pain   Complete by: As directed    Call MD for:  temperature >100.4   Complete by: As directed    Diet general   Complete by: As directed    1) limit free water intake to less than 30 ounces (900 ml/day), limited total Fluid intake to less than 60 ounces (1.8 L/day)   Discharge instructions   Complete by: As directed    1) limit free water intake to less than 30 ounces (900 ml/day), limited total Fluid intake to less than 60 ounces (1.8 L/day) 2)Weigh yourself daily, call if you gain more than 3 pounds in 1 day or more than 5 pounds in 1 week as your diuretic medications may need to be adjusted 3) follow-up with your primary care physician for repeat BMP blood test in 1 to 2 weeks   Increase  activity slowly   Complete by: As directed    Out of bed with assistance        Discharge Medications     Allergies as of 12/01/2018   No Known Allergies     Medication List    STOP taking these medications   lisinopril 20 MG tablet Commonly known as: ZESTRIL   trimethoprim 100 MG tablet Commonly known as: TRIMPEX     TAKE these medications   acetaminophen 325 MG tablet Commonly known as: TYLENOL Take 2 tablets (650 mg total) by mouth every 6 (six) hours as needed for mild pain.   amLODipine 2.5 MG tablet Commonly known as: NORVASC Take 1 tablet (2.5 mg total) by mouth daily. Start taking on: December 02, 2018   aspirin 81 MG chewable tablet Chew 1 tablet (81 mg total) by mouth daily with breakfast. What changed: when to take this   feeding supplement (ENSURE ENLIVE) Liqd Take 237 mLs by mouth 2 (two) times daily between meals.   ferrous sulfate 325 (65 FE) MG tablet Take 325 mg 2 (two) times daily by mouth.   furosemide 20 MG tablet Commonly known as: LASIX Take 0.5 tablets (10 mg total) by mouth daily. Start taking on: December 02, 2018   Klor-Con M10 10 MEQ tablet Generic drug: potassium chloride   pantoprazole 40 MG tablet Commonly known as: PROTONIX Take 40 mg daily before breakfast by mouth.       Major procedures and Radiology Reports - PLEASE review detailed and final reports for all details, in brief -   Dg Chest Port 1 View  Result Date: 11/20/2018 CLINICAL DATA:  85 year old female with history of progressive weakness as well as cough and some chest pain. EXAM: PORTABLE CHEST 1 VIEW COMPARISON:  Chest x-ray 10/17/2018. FINDINGS: Patchy ill-defined areas of interstitial prominence and airspace disease in the right lung, most evident in the right upper lobe and medial aspect of the right lung base, concerning for multilobar pneumonia. Left lung appears clear. No pleural effusions. No evidence of pulmonary edema. Heart size is normal. Upper mediastinal  contours are within normal limits. Aortic atherosclerosis. IMPRESSION: 1. Findings are concerning for developing multilobar bronchopneumonia in  the right lung, as above. 2. Aortic atherosclerosis. Electronically Signed   By: Vinnie Langton M.D.   On: 11/20/2018 10:41   Dg Femur Min 2 Views Left  Result Date: 11/20/2018 CLINICAL DATA:  LEFT leg pain.  History of prior fracture and ORIF. EXAM: LEFT FEMUR 2 VIEWS COMPARISON:  None. FINDINGS: Plate/wire/screw fixation of the LEFT femur is again noted. There is SUPERIOR position of the remaining LEFT femur in relation to the acetabulum with no discernible femoral head identified. This is unchanged from 06/15/2017. No new abnormalities or acute fractures noted. Degenerative changes in the knee again identified. IMPRESSION: 1. No acute abnormality. No interval change from 06/15/2017. LEFT femur ORIF with SUPERIOR position of the LEFT femur in relation to the acetabulum with no discernible femoral head identified. Electronically Signed   By: Margarette Canada M.D.   On: 11/20/2018 12:32   Dg Femur Min 2 Views Right  Result Date: 11/20/2018 CLINICAL DATA:  RIGHT leg pain.  Initial encounter. EXAM: RIGHT FEMUR 2 VIEWS COMPARISON:  None. FINDINGS: No evidence of fracture or dislocation. No focal bony lesions are present. Severe degenerative changes in the knee are noted. IMPRESSION: No acute abnormality Electronically Signed   By: Margarette Canada M.D.   On: 11/20/2018 12:29    Micro Results  No results found for this or any previous visit (from the past 240 hour(s)).     Today   Subjective    Ranyia Witting today has no new concerns, --Eating and drinking better, --A lot more cooperative today, allowing blood draws          Patient has been seen and examined prior to discharge   Objective   Blood pressure (!) 122/57, pulse 87, temperature 97.9 F (36.6 C), temperature source Oral, resp. rate 16, height 5\' 5"  (1.651 m), weight 49.2 kg, SpO2 99  %.   Intake/Output Summary (Last 24 hours) at 12/01/2018 1247 Last data filed at 12/01/2018 0911 Gross per 24 hour  Intake 240 ml  Output 400 ml  Net -160 ml    Exam Gen:- Awake Alert, no acute distress  HEENT:- [place].AT, No sclera icterus Neck-Supple Neck,No JVD,.  Lungs-mostly clear, fair air movement  CV- S1, S2 normal, regular Abd-  +ve B.Sounds, Abd Soft, No tenderness,    Extremity/Skin:- No  edema,   good pulses Psych-affect is appropriate, oriented x3 Neuro-no new focal deficits, no tremors    Data Review   CBC w Diff:  Lab Results  Component Value Date   WBC 3.1 (L) 11/26/2018   HGB 10.2 (L) 11/26/2018   HCT 30.7 (L) 11/26/2018   PLT 319 11/26/2018   LYMPHOPCT 14 11/20/2018   MONOPCT 20 11/20/2018   EOSPCT 6 11/20/2018   BASOPCT 1 11/20/2018    CMP:  Lab Results  Component Value Date   NA 126 (L) 12/01/2018   K 3.8 12/01/2018   CL 94 (L) 12/01/2018   CO2 22 12/01/2018   BUN 18 12/01/2018   CREATININE 0.44 12/01/2018   PROT 7.9 11/20/2018   ALBUMIN 3.2 (L) 12/01/2018   BILITOT 0.6 11/20/2018   ALKPHOS 75 11/20/2018   AST 27 11/20/2018   ALT 18 11/20/2018  .   Total Discharge time is about 33 minutes  Roxan Hockey M.D on 12/01/2018 at 12:47 PM  Go to [email or web address] -  for contact info  [facility] - Office  [phone]

## 2018-12-01 NOTE — Progress Notes (Addendum)
Patient continues to refuse care and medications. When asked if patient's vital signs can be taken, the patient refuses. When asked if patient will take medication, patient says "No". Patient will not answer any other questions. MD made aware. Will continue to monitor patient.

## 2018-12-01 NOTE — Consult Note (Signed)
Consultation Note Date: 12/01/2018   Patient Name: [name]  DOB: 1934/04/17  MRN: [id number]  Age / Sex: 85 y.o., female  PCP: Medicine, [facility] Referring Physician: Roxan Hockey, MD  Reason for Consultation: Establishing goals of care and Psychosocial/spiritual support  HPI/Patient Profile: 85 y.o. female  with past medical history of anemia, depression, frequent falls, hypertension, left bundle branch block, type 2 diabetes, GERD, left hip replacement surgery admitted on 11/20/2018 with lobar pneumonia.   Clinical Assessment and Goals of Care: [name] is resting quietly in bed.  She is sleeping but wakes easily and quickly.  She has kyphosis, but will make an somewhat keep eye contact.  She is alert and oriented, pleasant.  There is no family at bedside at this time.  Her breakfast tray is in front of her and she has eaten a fair amount of her food.  She asks for me to remove her breakfast tray.  She is agreeable to eat her banana which I pill for her, she is able to hold and self-feed her banana.  I clean her glasses, and she thinks me.  We talked about why she is hospitalized, and she states that she has been in the hospital due to pneumonia.  No contact with daughter today as patient has discharge orders.   Conference with bedside nursing staff and attending related to patient condition, needs, discharge plan.   HCPOA    NEXT OF KIN -  Daughter [name]     SUMMARY [place] with home health services  Code Status/Advance Care Planning:  Full code  Symptom Management:   Per hospitalist, no additional needs at this time.  Palliative Prophylaxis:   Oral Care and Turn Reposition  Additional Recommendations (Limitations, Scope, Preferences):  Full Scope Treatment  Psycho-social/Spiritual:   Desire for further Chaplaincy support:no  Additional  Recommendations: Caregiving  Support/Resources and Education on Hospice  Prognosis:  Unable to determine, based on outcomes.  Discharge Planning: Home with home health      Primary Diagnoses: Present on Admission: . Right lower lobe pneumonia ([place]) . Hypertension . Hyponatremia . Anemia, chronic disease . Does not walk - Wheelchair bound . GERD (gastroesophageal reflux disease)   I have reviewed the medical record, interviewed the patient and family, and examined the patient. The following aspects are pertinent.  Past Medical History:  Diagnosis Date  . Anemia    takes Ferrous Gluconate daily  . Anxiety   . Arthritis    "hands, feet, bad arthritis all over" (03/15/2017)  . Bell's palsy   . Depression   . Fall 03/15/2017   resulting in left hip dislocation and periprostatic femur fracture./notes 03/15/2017  . Falls frequently    "couple times here recently" (03/15/2017)  . GERD (gastroesophageal reflux disease)   . Heart murmur   . Hypertension    takes Lisinopril and Amlodipine daily  . Left bundle branch block   . Periprosthetic fracture of femur following total replacement of hip    Left  .  Pressure injury of sacral region, stage 3 ([place]) 03/21/2017  . Thoracic aortic atherosclerosis ([place]) 03/17/2017  . Type II diabetes mellitus ([place])    Social History   Socioeconomic History  . Marital status: Widowed    Spouse name: Not on file  . Number of children: Not on file  . Years of education: Not on file  . Highest education level: Not on file  Occupational History  . Not on file  Social Needs  . Financial resource strain: Not on file  . Food insecurity    Worry: Not on file    Inability: Not on file  . Transportation needs    Medical: Not on file    Non-medical: Not on file  Tobacco Use  . Smoking status: Former Smoker    Packs/day: 3.00    Years: 19.00    Pack years: 57.00    Types: Cigarettes    Quit date: 05/20/1975    Years since quitting: 43.5  .  Smokeless tobacco: Never Used  Substance and Sexual Activity  . Alcohol use: No  . Drug use: No  . Sexual activity: Never  Lifestyle  . Physical activity    Days per week: Not on file    Minutes per session: Not on file  . Stress: Not on file  Relationships  . Social Herbalist on phone: Not on file    Gets together: Not on file    Attends religious service: Not on file    Active member of club or organization: Not on file    Attends meetings of clubs or organizations: Not on file    Relationship status: Not on file  Other Topics Concern  . Not on file  Social History Narrative  . Not on file   History reviewed. No pertinent family history. Scheduled Meds: . acetaminophen  650 mg Oral Q6H  . amLODipine  5 mg Oral Daily  . aspirin  81 mg Oral Daily  . enoxaparin (LOVENOX) injection  40 mg Subcutaneous Q24H  . feeding supplement (ENSURE ENLIVE)  237 mL Oral BID BM  . ferrous sulfate  325 mg Oral Q breakfast  . furosemide  10 mg Oral Daily  . mouth rinse  15 mL Mouth Rinse BID  . pantoprazole  40 mg Oral BID AC   Continuous Infusions: PRN Meds:.hydrALAZINE, ipratropium-albuterol, traMADol Medications Prior to Admission:  Prior to Admission medications   Medication Sig Start Date End Date Taking? Authorizing Provider  acetaminophen (TYLENOL) 325 MG tablet Take 2 tablets (650 mg total) by mouth every 6 (six) hours as needed for mild pain. 03/21/17  Yes Samuella Cota, MD  ferrous sulfate 325 (65 FE) MG tablet Take 325 mg 2 (two) times daily by mouth. 02/02/17  Yes [provider]  KLOR-CON M10 10 MEQ tablet  11/16/18  Yes [provider]  lisinopril (PRINIVIL,ZESTRIL) 20 MG tablet Take 20 mg by mouth daily.   Yes [provider]  pantoprazole (PROTONIX) 40 MG tablet Take 40 mg daily before breakfast by mouth. 02/24/17  Yes [provider]  trimethoprim (TRIMPEX) 100 MG tablet TAKE 1 TABLET BY MOUTH EVERYDAY AT BEDTIME 09/08/18   Yes [provider]  amLODipine (NORVASC) 2.5 MG tablet Take 1 tablet (2.5 mg total) by mouth daily. 12/02/18   Roxan Hockey, MD  aspirin 81 MG chewable tablet Chew 1 tablet (81 mg total) by mouth daily with breakfast. 12/01/18   Roxan Hockey, MD  feeding supplement, ENSURE ENLIVE, (  ENSURE ENLIVE) LIQD Take 237 mLs by mouth 2 (two) times daily between meals. 12/01/18   Roxan Hockey, MD  furosemide (LASIX) 20 MG tablet Take 0.5 tablets (10 mg total) by mouth daily. 12/02/18   Roxan Hockey, MD   No Known Allergies Review of Systems  Unable to perform ROS: Age    Physical Exam Vitals signs and nursing note reviewed.  Constitutional:      General: She is not in acute distress.    Appearance: She is ill-appearing.  Cardiovascular:     Rate and Rhythm: Normal rate.  Pulmonary:     Effort: Pulmonary effort is normal. No respiratory distress.  Abdominal:     General: Abdomen is flat. There is no distension.  Musculoskeletal:        General: No swelling.  Skin:    General: Skin is warm and dry.  Neurological:     Mental Status: She is alert and oriented to person, place, and time. Mental status is at baseline.  Psychiatric:        Mood and Affect: Mood normal.        Behavior: Behavior normal.     Vital Signs: BP (!) 122/57 (BP Location: Left Arm)   Pulse 87   Temp 97.9 F (36.6 C) (Oral)   Resp 16   Ht 5\' 5"  (1.651 m)   Wt 49.2 kg   SpO2 99%   BMI 18.04 kg/m  Pain Scale: 0-10   Pain Score: 0-No pain   SpO2: SpO2: 99 % O2 Device:SpO2: 99 % O2 Flow Rate: .O2 Flow Rate (L/min): 2 L/min  IO: Intake/output summary:   Intake/Output Summary (Last 24 hours) at 12/01/2018 1335 Last data filed at 12/01/2018 0911 Gross per 24 hour  Intake 240 ml  Output 400 ml  Net -160 ml    LBM: Last BM Date: 12/01/18 Baseline Weight: Weight: 48.1 kg Most recent weight: Weight: 49.2 kg     Palliative Assessment/Data:   Flowsheet Rows     Most Recent Value  Intake Tab   Referral Department  Hospitalist  Unit at Time of Referral  Med/Surg Unit  Palliative Care Primary Diagnosis  Neurology  Date Notified  11/30/18  Palliative Care Type  Return patient Palliative Care  Reason for referral  Clarify Goals of Care  Date of Admission  11/20/18  Date first seen by Palliative Care  12/01/18  # of days Palliative referral response time  1 Day(s)  # of days IP prior to Palliative referral  10  Clinical Assessment  Palliative Performance Scale Score  40%  Pain Max last 24 hours  Not able to report  Pain Min Last 24 hours  Not able to report  Dyspnea Max Last 24 Hours  Not able to report  Dyspnea Min Last 24 hours  Not able to report  Psychosocial & Spiritual Assessment  Palliative Care Outcomes      Time In: 1310 Time Out: 1340 Time Total: 30 minutes  Greater than 50%  of this time was spent counseling and coordinating care related to the above assessment and plan.  Signed by: Drue Novel, NP   Please contact Palliative Medicine Team phone at [phone] for questions and concerns.  For individual provider: See Shea Evans

## 2018-12-01 NOTE — Progress Notes (Signed)
Has been alert and oriented x 4 today and cooperating with care, taking medications and eating.  IV removed and discharge instructions reviewed with patient and daughter.  Son to transport home

## 2018-12-01 NOTE — Discharge Instructions (Signed)
1) limit free water intake to less than 30 ounces (900 ml/day), limited total Fluid intake to less than 60 ounces (1.8 L/day) 2)Weigh yourself daily, call if you gain more than 3 pounds in 1 day or more than 5 pounds in 1 week as your diuretic medications may need to be adjusted 3) follow-up with your primary care physician for repeat BMP blood test in 1 to 2 weeks

## 2019-03-01 ENCOUNTER — Ambulatory Visit: Payer: Medicare Other | Admitting: Urology

## 2019-03-12 ENCOUNTER — Encounter (HOSPITAL_COMMUNITY): Payer: Self-pay | Admitting: Emergency Medicine

## 2019-03-12 ENCOUNTER — Emergency Department (HOSPITAL_COMMUNITY)
Admission: EM | Admit: 2019-03-12 | Discharge: 2019-03-13 | Disposition: A | Discharge status: Home / Self Care | Payer: Medicare Other | Attending: Emergency Medicine | Admitting: Emergency Medicine

## 2019-03-12 ENCOUNTER — Other Ambulatory Visit: Payer: Self-pay

## 2019-03-12 ENCOUNTER — Emergency Department (HOSPITAL_COMMUNITY): Payer: Medicare Other

## 2019-03-12 DIAGNOSIS — G9589 Other specified diseases of spinal cord: Secondary | ICD-10-CM

## 2019-03-12 DIAGNOSIS — C7931 Secondary malignant neoplasm of brain: Secondary | ICD-10-CM | POA: Insufficient documentation

## 2019-03-12 DIAGNOSIS — I10 Essential (primary) hypertension: Secondary | ICD-10-CM | POA: Insufficient documentation

## 2019-03-12 DIAGNOSIS — N390 Urinary tract infection, site not specified: Secondary | ICD-10-CM | POA: Diagnosis not present

## 2019-03-12 DIAGNOSIS — Z87891 Personal history of nicotine dependence: Secondary | ICD-10-CM | POA: Diagnosis not present

## 2019-03-12 DIAGNOSIS — E119 Type 2 diabetes mellitus without complications: Secondary | ICD-10-CM | POA: Diagnosis not present

## 2019-03-12 DIAGNOSIS — Z79899 Other long term (current) drug therapy: Secondary | ICD-10-CM | POA: Insufficient documentation

## 2019-03-12 DIAGNOSIS — Z7982 Long term (current) use of aspirin: Secondary | ICD-10-CM | POA: Insufficient documentation

## 2019-03-12 DIAGNOSIS — M542 Cervicalgia: Secondary | ICD-10-CM

## 2019-03-12 LAB — CBC
HCT: 35.5 % — ABNORMAL LOW (ref 36.0–46.0)
Hemoglobin: 11.6 g/dL — ABNORMAL LOW (ref 12.0–15.0)
MCH: 28.4 pg (ref 26.0–34.0)
MCHC: 32.7 g/dL (ref 30.0–36.0)
MCV: 87 fL (ref 80.0–100.0)
Platelets: 255 10*3/uL (ref 150–400)
RBC: 4.08 MIL/uL (ref 3.87–5.11)
RDW: 14.3 % (ref 11.5–15.5)
WBC: 3.9 10*3/uL — ABNORMAL LOW (ref 4.0–10.5)
nRBC: 0 % (ref 0.0–0.2)

## 2019-03-12 LAB — TROPONIN I (HIGH SENSITIVITY)
Troponin I (High Sensitivity): 7 ng/L (ref <18)
Troponin I (High Sensitivity): 9 ng/L (ref <18)

## 2019-03-12 LAB — BASIC METABOLIC PANEL
Anion gap: 11 (ref 5–15)
BUN: 19 mg/dL (ref 8–23)
CO2: 22 mmol/L (ref 22–32)
Calcium: 9.5 mg/dL (ref 8.9–10.3)
Chloride: 101 mmol/L (ref 98–111)
Creatinine, Ser: 0.53 mg/dL (ref 0.44–1.00)
GFR calc Af Amer: >60 mL/min (ref >60)
GFR calc non Af Amer: >60 mL/min (ref >60)
Glucose, Bld: 124 mg/dL — ABNORMAL HIGH (ref 70–99)
Potassium: 4.3 mmol/L (ref 3.5–5.1)
Sodium: 134 mmol/L — ABNORMAL LOW (ref 135–145)

## 2019-03-12 MED ORDER — SODIUM CHLORIDE 0.9% FLUSH: 3.0000 mL | Freq: Once | INTRAVENOUS | Status: DC

## 2019-03-12 NOTE — ED Notes (Signed)
Pt placed on purewick 

## 2019-03-12 NOTE — ED Triage Notes (Signed)
C/o neck pain, bilateral shoulder pain, and chest pain intermittently since August.  States pain worse over the last 3 weeks.

## 2019-03-12 NOTE — Progress Notes (Signed)
Called for MRI [id number], per RN patient is unable to lay down for scan

## 2019-03-12 NOTE — ED Provider Notes (Signed)
11:05 PM  Assumed care from Dr. Vanita Panda.  Patient is a 85 y.o. F with neck pain.  R sided weakness at baseline from previous stroke.  Nonambulatory due to prior hip fractures.  Here with worsening neck pain x weeks.  L arm weakness x weeks.  MRI head and neck pending, urin pending.  12:55 AM  Informed by nurse that patient unable to lie flat without something underneath her head and neck because of her kyphosis and therefore they will not be able to obtain MRI.  Patient states that there is no way she can lie flat without something underneath her head.  Confirmed this with MRI tech that MRI would not be able to be performed if she cannot lie flat.  Discussed further with daughter.  She states the main reason they are here is because of her complaining of neck pain all the time and all she has at home is Tylenol.  I suspect that a lot of her neck pain that radiates into both of her shoulders is for her trying to hold her head up when lying back because of her significant kyphosis.  The daughter reports that she has had right-sided weakness for 6 months and left-sided weakness for 3 to 4 months.  No acute focal neurologic deficits.  It looks like many of the symptoms were discussed during an ED visit in June 2020.  At that time had a head CT which did show a right cerebellar lesion.  She had documented weakness in both arms at that time.  Will obtain CT of the head and cervical spine.  We did discuss that these are not as good of ruling out surgical pathology or stroke but given the symptoms seem subacute to now chronic I think these would be reasonable studies to perform and she can follow-up with her doctor as an outpatient.  Daughter agrees with this plan.  She states they mainly needs something stronger than Tylenol to help with pain because she keeps them up all night complaining of discomfort and needing to be repositioned.  They feel comfortable caring for her at home and do not feel she needs placement at  this time.  She has 5 children that currently take care of her 24/7 in the patient's home.  Patient denies having pain currently.  Plan will be to discharge patient home with pain medication.  Patient last received tramadol in June 2020.  Labs here are unremarkable.  Urinalysis pending.  1:40 AM  Pt's urine appears infected.  Culture is pending.  She has previously grown Enterococcus, MRSA and Klebsiella that have all been sensitive to [place].  She does not have a chronic indwelling Foley catheter.  No concerns for complicated UTI or pyelonephritis at this time.  We will start her on Macrobid today.  Daughter states that she takes Bactrim once daily for the past year due to chronic UTIs.   2:30 AM  CT head shows a well-circumscribed rounded hypodensity in the right cerebellum that is 13 x 12 x 12 mm that has increased in size since June 2020.  There is no surrounding edema or mass-effect.  Radiology recommends MRI.  Discussed concerns with patient not being able to lie flat with radiologist as well as radiology technician.  Technician states that he can try to use a head coil to help obtain the MRI with and without contrast.  Dr. Leonel Ramsay with neurology has reviewed imaging as well and both radiologist and neurology feel the best image would  be an MRI that this could be a slow-growing mass versus chronic infarct.  CT head with contrast could also be ordered but would not be the best study.  Discussed this at length with patient and daughter.  They agree with holding off on MRI of the cervical spine at this time as even if she had surgical pathology, they would not want surgical intervention.  They understand that her weakness in both upper extremities is likely permanent given chronicity of symptoms.  They would however like to proceed with MRI of the brain if possible to further evaluate this right cerebellar lesion.  Again they do not want surgery, radiation, chemotherapy but would like imaging more  for diagnostic purposes.  Would now like some pain medication for her neck pain.  Will provide with Vicodin.  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  Discussed things findings at length with patient and daughter.  Patient does not waver on the fact that she would not want surgery but wants to know if there are any other interventions that can be performed.  Will discuss with neurosurgery on call.  Will give IV Decadron.  No history of cancer.  On exam, patient has no numbness.  She reports some tingling of the left leg.  She has complete paralysis of both lower extremities.  Unable to use the left hand at all.  Able to lift the left arm off the bed.  Flaccid paralysis of the right upper extremity.  Family again reports that the symptoms have been ongoing for months.  5:40 AM  Spoke with Dr. Ellene Route with neurosurgery.  He will review patient's imaging and see her in the ED this morning.  Patient and daughter have been updated with plan.  7:15 AM  Signed out to Dr. Rogene Houston who will follow up on NSG plan for dispo.  Patient would prefer dc home.  Family does not want placement.  Pain meds, abx for UTI sent to pharmacy.   I reviewed all nursing notes and pertinent previous records as available.  I have interpreted any EKGs, lab and urine results, imaging (as available).    [name], Delice Bison, DO 03/13/19 [phone]

## 2019-03-12 NOTE — ED Provider Notes (Signed)
[name] EM[facility]ENC[facility] DEPART[facility] Provider Note   CSN: [id number] Arrival date & time: 03/12/19  1710     History   Chief Complaint Chief Complaint  Patient presents with   Neck Pain   Chest Pain    HPI [name] is a 85 y.o. female.     Patient is a 85 year old female who presents with neck pain.  She is nonambulatory due to prior hip fractures.  She also has some baseline flaccidness in her right arm and right leg reportedly from a prior stroke.  She had been able to feed herself with her left hand up until about 4 5 months ago and now is not able to significantly use her left arm.  She notes some worsening pain in her neck over the last 2 to 3 weeks.  She does have some significant kyphosis and her daughter says that she has been more hunched over over the last few months.  Her left arm has continued to get weaker and she cannot feed herself or hold anything in her left hand.  She denies any known falls.  She is essentially bedridden but does get around in a wheelchair.  She has some mild dementia but is conversive.  She has been using Tylenol and ibuprofen for the neck pain without improvement in symptoms.  She had talked to her mobile doctor that comes to her house and they have discussed getting an MRI of her neck but she has not yet had it.  She says the pain is throughout her neck and radiates across her shoulders.  She does have some numbness in her hands but she cannot really describe how long its been going on or whether it has been there chronically.  Her daughter also states that she has had an ongoing cough for the last several months.  She was admitted in July for pneumonia.  She also has trouble with chronic hyponatremia.  They are concerned that she may have a urinary tract infection although she does not seem to have any change in her baseline weakness or mental status.     Past Medical History:  Diagnosis Date   Anemia    takes Ferrous Gluconate  daily   Anxiety    Arthritis    "hands, feet, bad arthritis all over" (03/15/2017)   Bell's palsy    Depression    Fall 03/15/2017   resulting in left hip dislocation and periprostatic femur fracture./notes 03/15/2017   Falls frequently    "couple times here recently" (03/15/2017)   GERD (gastroesophageal reflux disease)    Heart murmur    Hypertension    takes Lisinopril and Amlodipine daily   Left bundle branch block    Periprosthetic fracture of femur following total replacement of hip    Left   Pressure injury of sacral region, stage 3 ([place]) 03/21/2017   Thoracic aortic atherosclerosis ([place]) 03/17/2017   Type II diabetes mellitus ([place])     Patient Active Problem List   Diagnosis Date Noted   Lobar pneumonia ([place]) 11/26/2018   Right lower lobe pneumonia 11/20/2018   Does not walk - Wheelchair bound 11/20/2018   Generalized weakness 11/20/2018   UTI (urinary tract infection) 09/02/2017   GERD (gastroesophageal reflux disease) 09/02/2017   Anxiety with depression 09/02/2017   Altered mental status 09/02/2017   Tachycardia 09/02/2017   Goals of care, counseling/discussion    Palliative care by specialist    Chest pain 06/20/2017   HCAP (healthcare-associated pneumonia) 06/20/2017  Pressure injury of sacral region, stage 3 ([place]) 03/21/2017   Dislocated hip, left, initial encounter ([place]) 03/19/2017   Thoracic aortic atherosclerosis ([place]) 03/17/2017   Pressure injury of skin 03/17/2017   Femur fracture, left ([place]) 06/09/2012   Acute blood loss anemia 05/24/2012   Hyponatremia 05/20/2012   Anemia, chronic disease 05/20/2012   Left bundle branch block 05/20/2012   Hip fracture ([place]) 05/19/2012   Hypertension 05/19/2012    Past Surgical History:  Procedure Laterality Date   APPENDECTOMY     FRACTURE SURGERY     HIP ARTHROPLASTY  05/20/2012   Procedure: ARTHROPLASTY BIPOLAR HIP;  Surgeon: Marybelle Killings, MD;  Location: [facility];   Service: Orthopedics;  Laterality: Left;  Left monopolar hemiarthroplasty   ORIF FEMUR FRACTURE Left 03/17/2017   Procedure: REMOVAL OF HEMIARTHROPLASTY, GIRDLESTONE, OPEN REDUCTION INTERNAL FIXATION OF FEMUR FRACTURE, PLATING OF FEMUR FRACTURE;  Surgeon: Marybelle Killings, MD;  Location: [facility];  Service: Orthopedics;  Laterality: Left;   TOTAL HIP REVISION Left 06/08/2012   Procedure: TOTAL HIP REVISION;  Surgeon: Marybelle Killings, MD;  Location: [facility];  Service: Orthopedics;  Laterality: Left;  Revision left hip hemiarthroplasty for periprosthetic fracture   TUBAL LIGATION       OB History   No obstetric history on file.      Home Medications    Prior to Admission medications   Medication Sig Start Date End Date Taking? Authorizing Provider  acetaminophen (TYLENOL) 325 MG tablet Take 2 tablets (650 mg total) by mouth every 6 (six) hours as needed for mild pain. 03/21/17   Samuella Cota, MD  amLODipine (NORVASC) 2.5 MG tablet Take 1 tablet (2.5 mg total) by mouth daily. 12/02/18   Roxan Hockey, MD  aspirin 81 MG chewable tablet Chew 1 tablet (81 mg total) by mouth daily with breakfast. 12/01/18   Emokpae, Courage, MD  feeding supplement, ENSURE ENLIVE, (ENSURE ENLIVE) LIQD Take 237 mLs by mouth 2 (two) times daily between meals. 12/01/18   Roxan Hockey, MD  ferrous sulfate 325 (65 FE) MG tablet Take 325 mg 2 (two) times daily by mouth. 02/02/17   [provider]  furosemide (LASIX) 20 MG tablet Take 0.5 tablets (10 mg total) by mouth daily. 12/02/18   Roxan Hockey, MD  KLOR-CON M10 10 MEQ tablet  11/16/18   [provider]  pantoprazole (PROTONIX) 40 MG tablet Take 40 mg daily before breakfast by mouth. 02/24/17   [provider]    Family History No family history on file.  Social History Social History   Tobacco Use   Smoking status: Former Smoker    Packs/day: 3.00    Years: 19.00    Pack years: 57.00    Types: Cigarettes    Quit date:  05/20/1975    Years since quitting: 43.8   Smokeless tobacco: Never Used  Substance Use Topics   Alcohol use: No   Drug use: No     Allergies   Patient has no known allergies.   Review of Systems Review of Systems  Constitutional: Negative for chills, diaphoresis, fatigue and fever.  HENT: Negative for congestion, rhinorrhea and sneezing.   Eyes: Negative.   Respiratory: Positive for cough. Negative for chest tightness and shortness of breath.   Cardiovascular: Negative for chest pain and leg swelling.  Gastrointestinal: Negative for abdominal pain, blood in stool, diarrhea, nausea and vomiting.  Genitourinary: Negative for difficulty urinating, flank pain, frequency and hematuria.  Musculoskeletal: Positive for neck pain.  Negative for arthralgias and back pain.  Skin: Negative for rash.  Neurological: Positive for weakness and numbness. Negative for dizziness, speech difficulty and headaches.     Physical Exam Updated Vital Signs BP (!) 132/96 (BP Location: Right Arm)    Pulse 81    Temp 98.7 F (37.1 C) (Oral)    Resp 20    SpO2 96%   Physical Exam Constitutional:      Appearance: She is well-developed.  HENT:     Head: Normocephalic and atraumatic.  Eyes:     Pupils: Pupils are equal, round, and reactive to light.  Neck:     Comments: Patient has significant kyphosis.  She has tenderness throughout the cervical spine.  There is no pain to the thoracic or lumbosacral spine.  No step-offs or deformities.  There is mild tenderness in the musculature in the cervical paraspinal area bilaterally Cardiovascular:     Rate and Rhythm: Normal rate and regular rhythm.     Heart sounds: Normal heart sounds.  Pulmonary:     Effort: Pulmonary effort is normal. No respiratory distress.     Breath sounds: Normal breath sounds. No wheezing or rales.  Chest:     Chest wall: No tenderness.  Abdominal:     General: Bowel sounds are normal.     Palpations: Abdomen is soft.      Tenderness: There is no abdominal tenderness. There is no guarding or rebound.  Musculoskeletal: Normal range of motion.  Lymphadenopathy:     Cervical: No cervical adenopathy.  Skin:    General: Skin is warm and dry.     Findings: No rash.  Neurological:     Mental Status: She is alert and oriented to person, place, and time.     Comments: Patient is alert and conversive.  She is mildly confused.  She left arm and leg are flaccid.  She has limited use of her right leg.  She has some movement in her right arm but she has no grip strength or inability to raise arm much against gravity.      ED Treatments / Results  Labs (all labs ordered are listed, but only abnormal results are displayed) Labs Reviewed  BASIC METABOLIC PANEL - Abnormal; Notable for the following components:      Result Value   Sodium 134 (*)    Glucose, Bld 124 (*)    All other components within normal limits  CBC - Abnormal; Notable for the following components:   WBC 3.9 (*)    Hemoglobin 11.6 (*)    HCT 35.5 (*)    All other components within normal limits  TROPONIN I (HIGH SENSITIVITY)  TROPONIN I (HIGH SENSITIVITY)    EKG EKG Interpretation  Date/Time:  Sunday March 12 2019 19:45:04 EST Ventricular Rate:  75 PR Interval:  142 QRS Duration: 134 QT Interval:  416 QTC Calculation: 465 R Axis:   -26 Text Interpretation: Sinus rhythm Left bundle branch block Confirmed by Malvin Johns [phone]) on 03/12/2019 7:50:20 PM   Radiology Dg Chest 2 View  Result Date: 03/12/2019 CLINICAL DATA:  Chest pain. Shoulder and neck pain. EXAM: CHEST - 2 VIEW COMPARISON:  11/20/2018 FINDINGS: Mild improvement in aeration at the left lung base and left perihilar region, but with some residual interstitial accentuation favoring the right upper lobe. Patient was not able to raise her arms for the lateral projection. Old lower sternal body fracture with associated deformity. Atherosclerotic calcification of the aortic  arch. Mild enlargement  of the cardiopericardial silhouette. IMPRESSION: 1. Improved aeration at the left lung base and left perihilar region, but with some residual interstitial accentuation in the right upper lobe which may be chronic. 2. Mild enlargement of the cardiopericardial silhouette. 3. Atherosclerosis. Electronically Signed   By: Van Clines M.D.   On: 03/12/2019 19:06    Procedures Procedures (including critical care time)  Medications Ordered in ED Medications  sodium chloride flush (NS) 0.9 % injection 3 mL (has no administration in time range)     Initial Impression / Assessment and Plan / ED Course  I have reviewed the triage vital signs and the nursing notes.  Pertinent labs & imaging results that were available during my care of the patient were reviewed by me and considered in my medical decision making (see chart for details).        Patient is a 85 year old female who chronically is nonambulatory.  She has kyphosis and presents with worsening neck pain over the last 2 or 3 weeks.  She does have some chronic paralysis to her right arm and leg.  She has worsening paralysis to her left arm.  It is unclear exactly how long its been going on although it sounds like it started several months ago and she has not been able to feed herself with that arm for at least 2 months.  However she does have some relatively new worsening neck pain.  I did go ahead and order MRI of her brain and cervical spine.  These studies are pending.  Urinalysis is also pending.  Her other labs are nonconcerning.  Care turned over to Dr. Vanita Panda pending results.  Final Clinical Impressions(s) / ED Diagnoses   Final diagnoses:  None    ED Discharge Orders    None       Malvin Johns, MD 03/12/19 2101

## 2019-03-13 ENCOUNTER — Emergency Department (HOSPITAL_COMMUNITY): Payer: Medicare Other

## 2019-03-13 LAB — URINALYSIS, ROUTINE W REFLEX MICROSCOPIC
Bilirubin Urine: NEGATIVE
Glucose, UA: NEGATIVE mg/dL
Ketones, ur: NEGATIVE mg/dL
Nitrite: NEGATIVE
Protein, ur: 30 mg/dL — AB
Specific Gravity, Urine: 1.019 (ref 1.005–1.030)
pH: 5 (ref 5.0–8.0)

## 2019-03-13 LAB — URINE CULTURE: Culture: <10000 — AB

## 2019-03-13 MED ORDER — NITROFURANTOIN MONOHYD MACRO 100 MG PO CAPS: 100.0000 mg | ORAL_CAPSULE | Freq: Two times a day (BID) | ORAL | 0 refills | Status: AC

## 2019-03-13 MED ORDER — DOCUSATE SODIUM 100 MG PO CAPS: 100.0000 mg | ORAL_CAPSULE | Freq: Two times a day (BID) | ORAL | 0 refills | Status: AC

## 2019-03-13 MED ORDER — NITROFURANTOIN MONOHYD MACRO 100 MG PO CAPS
100.0000 mg | ORAL_CAPSULE | Freq: Once | ORAL | Status: AC
  Administered 2019-03-13: 04:00:00 100 mg via ORAL
  Filled 2019-03-13: qty 1

## 2019-03-13 MED ORDER — ONDANSETRON 4 MG PO TBDP: 4.0000 mg | ORAL_TABLET | Freq: Four times a day (QID) | ORAL | 0 refills | Status: AC | PRN

## 2019-03-13 MED ORDER — DEXAMETHASONE SODIUM PHOSPHATE 10 MG/ML IJ SOLN
10.0000 mg | Freq: Once | INTRAMUSCULAR | Status: AC
  Administered 2019-03-13: 10 mg via INTRAVENOUS
  Filled 2019-03-13: qty 1

## 2019-03-13 MED ORDER — ONDANSETRON 4 MG PO TBDP
ORAL_TABLET | ORAL | Status: AC
  Administered 2019-03-13: 04:00:00
  Filled 2019-03-13: qty 1

## 2019-03-13 MED ORDER — GADOBUTROL 1 MMOL/ML IV SOLN
5.0000 mL | Freq: Once | INTRAVENOUS | Status: AC | PRN
  Administered 2019-03-13: 05:00:00 5 mL via INTRAVENOUS

## 2019-03-13 MED ORDER — ONDANSETRON 4 MG PO TBDP
4.0000 mg | ORAL_TABLET | Freq: Once | ORAL | Status: AC
  Administered 2019-03-13: 04:00:00 4 mg via ORAL
  Filled 2019-03-13: qty 1

## 2019-03-13 MED ORDER — HYDROCODONE-ACETAMINOPHEN 5-325 MG PO TABS: 1.0000 | ORAL_TABLET | Freq: Four times a day (QID) | ORAL | 0 refills | Status: AC | PRN

## 2019-03-13 MED ORDER — HYDROCODONE-ACETAMINOPHEN 5-325 MG PO TABS
1.0000 | ORAL_TABLET | Freq: Once | ORAL | Status: AC
  Administered 2019-03-13: 04:00:00 1 via ORAL
  Filled 2019-03-13: qty 1

## 2019-03-13 NOTE — ED Notes (Signed)
Pt transported to MRI 

## 2019-03-13 NOTE — ED Notes (Signed)
Patient verbalizes understanding of discharge instructions. Opportunity for questioning and answers were provided. Armband removed by staff, pt discharged from ED.  

## 2019-03-13 NOTE — Consult Note (Signed)
Reason for Consult: Progressive paralysis, metastatic cancer to the brain and spinal cord. No known primary Referring Physician: Dr. Rozell Garcia is an 85 y.o. female.  HPI: [name] is an 85 year old individual whose had significant deterioration in function over the last weeks to months time. The patient has known weakness in her right side arm and leg from history of previous stroke. She has had some hip fractures in the past. She is nonambulatory but here recently she is lost the use of her left upper extremity which had always been stronger and has become progressively paralyzed. Family had concerns that she may have a UTI and she was also complaining of some neck pain she came to the [facility] emergency department for further work-up. An MRI of the brain demonstrates the presence of multiple small metastatic lesions in the brain and a substantial lesion at the level of C3 and C4 with a cystic mass in the spinal canal also presumed to be a metastasis. Neurosurgical consultation was requested though the patient notes that she is not keen on any surgical intervention. Daughter who accompanies the patient notes that she had been able to participate in her self-care being able to change her own pamper and feed herself with her left hand but now is completely dependent on help for returning positioning feeding and all activities of daily living. I expressed to the daughter and the patient what is occurring in the brain and the spine and the patient herself emphatically noted that she is not keen on any surgical intervention. I noted to the daughter that there does not appear to be any known primary source for the lesions in her brain. The patient may be a candidate for conservative treatment with radiation but I suspect radiation oncology would like to know if there is a source of the primary. Any surgery would only be palliative and for the purposes of diagnosis. In all likelihood I believe that  conservative management with perhaps some involvement of hospice care would be of benefit. The patient and her daughter notes that she is cared for at home with the help of several family members.  I do not believe any metastatic work-up has been performed such as a CT of the chest abdomen or pelvis but I would defer this to primary medicine or radiation oncology if some intervention is desired. Otherwise I believe hospice care would be appropriate consideration  Past Medical History:  Diagnosis Date  . Anemia    takes Ferrous Gluconate daily  . Anxiety   . Arthritis    "hands, feet, bad arthritis all over" (03/15/2017)  . Bell's palsy   . Depression   . Fall 03/15/2017   resulting in left hip dislocation and periprostatic femur fracture./notes 03/15/2017  . Falls frequently    "couple times here recently" (03/15/2017)  . GERD (gastroesophageal reflux disease)   . Heart murmur   . Hypertension    takes Lisinopril and Amlodipine daily  . Left bundle branch block   . Periprosthetic fracture of femur following total replacement of hip    Left  . Pressure injury of sacral region, stage 3 ([place]) 03/21/2017  . Thoracic aortic atherosclerosis ([place]) 03/17/2017  . Type II diabetes mellitus ([place])     Past Surgical History:  Procedure Laterality Date  . APPENDECTOMY    . FRACTURE SURGERY    . HIP ARTHROPLASTY  05/20/2012   Procedure: ARTHROPLASTY BIPOLAR HIP;  Surgeon: Marybelle Killings, MD;  Location: [place];  Service: Orthopedics;  Laterality: Left;  Left monopolar hemiarthroplasty  . ORIF FEMUR FRACTURE Left 03/17/2017   Procedure: REMOVAL OF HEMIARTHROPLASTY, GIRDLESTONE, OPEN REDUCTION INTERNAL FIXATION OF FEMUR FRACTURE, PLATING OF FEMUR FRACTURE;  Surgeon: Marybelle Killings, MD;  Location: [place];  Service: Orthopedics;  Laterality: Left;  . TOTAL HIP REVISION Left 06/08/2012   Procedure: TOTAL HIP REVISION;  Surgeon: Marybelle Killings, MD;  Location: [place];  Service: Orthopedics;  Laterality: Left;   Revision left hip hemiarthroplasty for periprosthetic fracture  . TUBAL LIGATION      No family history on file.  Social History:  reports that she quit smoking about 43 years ago. Her smoking use included cigarettes. She has a 57.00 pack-year smoking history. She has never used smokeless tobacco. She reports that she does not drink alcohol or use drugs.  Allergies: No Known Allergies  Medications: I have reviewed the patient's current medications.  Results for orders placed or performed during the hospital encounter of 03/12/19 (from the past 48 hour(s))  Basic metabolic panel     Status: Abnormal   Collection Time: 03/12/19  5:31 PM  Result Value Ref Range   Sodium 134 (L) 135 - 145 mmol/L   Potassium 4.3 3.5 - 5.1 mmol/L   Chloride 101 98 - 111 mmol/L   CO2 22 22 - 32 mmol/L   Glucose, Bld 124 (H) 70 - 99 mg/dL   BUN 19 8 - 23 mg/dL   Creatinine, Ser 0.53 0.44 - 1.00 mg/dL   Calcium 9.5 8.9 - 10.3 mg/dL   GFR calc non Af Amer >60 >60 mL/min   GFR calc Af Amer >60 >60 mL/min   Anion gap 11 5 - 15    Comment: Performed at [facility], [place] [address], [place], [place] [address]  CBC     Status: Abnormal   Collection Time: 03/12/19  5:31 PM  Result Value Ref Range   WBC 3.9 (L) 4.0 - 10.5 K/uL   RBC 4.08 3.87 - 5.11 MIL/uL   Hemoglobin 11.6 (L) 12.0 - 15.0 g/dL   HCT 35.5 (L) 36.0 - 46.0 %   MCV 87.0 80.0 - 100.0 fL   MCH 28.4 26.0 - 34.0 pg   MCHC 32.7 30.0 - 36.0 g/dL   RDW 14.3 11.5 - 15.5 %   Platelets 255 150 - 400 K/uL   nRBC 0.0 0.0 - 0.2 %    Comment: Performed at [facility], [place] [address] [place], [place] [address]  Troponin I (High Sensitivity)     Status: None   Collection Time: 03/12/19  5:31 PM  Result Value Ref Range   Troponin I (High Sensitivity) 7 <18 ng/L    Comment: (NOTE) Elevated high sensitivity troponin I (hsTnI) values and significant  changes across serial measurements may suggest ACS but many other  chronic and acute  conditions are known to elevate hsTnI results.  Refer to the "Links" section for chest pain algorithms and additional  guidance. Performed at [facility], [place] [address], [place], [place] [address]   Troponin I (High Sensitivity)     Status: None   Collection Time: 03/12/19  7:27 PM  Result Value Ref Range   Troponin I (High Sensitivity) 9 <18 ng/L    Comment: (NOTE) Elevated high sensitivity troponin I (hsTnI) values and significant  changes across serial measurements may suggest ACS but many other  chronic and acute conditions are known to elevate hsTnI results.  Refer to the "Links" section for chest pain algorithms and additional  guidance. Performed at [facility], [place] [address], [place], [place] [address]   Urinalysis, Routine w reflex microscopic     Status: Abnormal   Collection Time: 03/13/19  1:19 AM  Result Value Ref Range   Color, Urine YELLOW YELLOW   APPearance HAZY (A) CLEAR   Specific Gravity, Urine 1.019 1.005 - 1.030   pH 5.0 5.0 - 8.0   Glucose, UA NEGATIVE NEGATIVE mg/dL   Hgb urine dipstick SMALL (A) NEGATIVE   Bilirubin Urine NEGATIVE NEGATIVE   Ketones, ur NEGATIVE NEGATIVE mg/dL   Protein, ur 30 (A) NEGATIVE mg/dL   Nitrite NEGATIVE NEGATIVE   Leukocytes,Ua MODERATE (A) NEGATIVE   RBC / HPF 11-20 0 - 5 RBC/hpf   WBC, UA 11-20 0 - 5 WBC/hpf   Bacteria, UA FEW (A) NONE SEEN   Squamous Epithelial / LPF 0-5 0 - 5   Mucus PRESENT     Comment: Performed at [facility], [place] [address], [place], [place] [address]    Dg Chest 2 View  Result Date: 03/12/2019 CLINICAL DATA:  Chest pain. Shoulder and neck pain. EXAM: CHEST - 2 VIEW COMPARISON:  11/20/2018 FINDINGS: Mild improvement in aeration at the left lung base and left perihilar region, but with some residual interstitial accentuation favoring the right upper lobe. Patient was not able to raise her arms for the lateral projection. Old lower sternal body fracture with associated  deformity. Atherosclerotic calcification of the aortic arch. Mild enlargement of the cardiopericardial silhouette. IMPRESSION: 1. Improved aeration at the left lung base and left perihilar region, but with some residual interstitial accentuation in the right upper lobe which may be chronic. 2. Mild enlargement of the cardiopericardial silhouette. 3. Atherosclerosis. Electronically Signed   By: Van Clines M.D.   On: 03/12/2019 19:06   Ct Head Wo Contrast  Result Date: 03/13/2019 CLINICAL DATA:  Right-sided weakness for 6 months. Left-sided weakness for 4 months. Neck pain. EXAM: CT HEAD WITHOUT CONTRAST CT CERVICAL SPINE WITHOUT CONTRAST TECHNIQUE: Multidetector CT imaging of the head and cervical spine was performed following the standard protocol without intravenous contrast. Multiplanar CT image reconstructions of the cervical spine were also generated. COMPARISON:  Head and cervical spine CT 10/17/2018 FINDINGS: CT HEAD FINDINGS Brain: Well-circumscribed rounded hypodensity in the right cerebellum measures 13 x 12 x 12 mm, slightly increased in size from prior exam where it measured 9 mm. No significant surrounding edema or mass effect. No acute hemorrhage. Age related atrophy with moderate chronic small vessel ischemia. Remote lacunar infarcts in bilateral basal ganglia. No subdural or extra-axial collection. No hydrocephalus, the basilar cisterns are patent. Vascular: Atherosclerosis of skullbase vasculature without hyperdense vessel or abnormal calcification. Skull: No fracture or focal lesion. Sinuses/Orbits: Paranasal sinuses and mastoid air cells are clear. The visualized orbits are unremarkable. Other: None. CT CERVICAL SPINE FINDINGS Alignment: Straightening of normal lordosis. Trace anterolisthesis of C3 on C4 and C4 on C5 again seen. Facets normally aligned. Skull base and vertebrae: No acute fracture or focal pathologic process. The dens and skull base are intact. Soft tissues and spinal  canal: No prevertebral fluid or swelling. No visible canal hematoma. Disc levels: Diffuse degenerative disc disease with diffuse disc space narrowing and endplate spurring. Near complete disc space loss at C3-C4, C4-C5, and C5-C6, unchanged from prior exam. Multilevel facet hypertrophy. No high-grade canal stenosis. Scattered multilevel neural foraminal stenosis. Upper chest: Apical emphysema. No  acute finding. Other: None. IMPRESSION: 1. Well-circumscribed rounded hypodensity in the right cerebellum measures 13 x 12 x 12 mm, slightly increased in size from June 2020 exam where it measured 9 mm. No significant surrounding edema or mass effect by CT. Given increase in size, recommend further evaluation with MRI to exclude underlying lesion. 2. Age related atrophy and chronic small vessel ischemia. Remote lacunar infarcts in bilateral basal ganglia. 3. Multilevel degenerative change throughout the cervical spine without osseous abnormality of the cervical spine. Electronically Signed   By: Keith Rake M.D.   On: 03/13/2019 01:55   Ct Cervical Spine Wo Contrast  Result Date: 03/13/2019 CLINICAL DATA:  Right-sided weakness for 6 months. Left-sided weakness for 4 months. Neck pain. EXAM: CT HEAD WITHOUT CONTRAST CT CERVICAL SPINE WITHOUT CONTRAST TECHNIQUE: Multidetector CT imaging of the head and cervical spine was performed following the standard protocol without intravenous contrast. Multiplanar CT image reconstructions of the cervical spine were also generated. COMPARISON:  Head and cervical spine CT 10/17/2018 FINDINGS: CT HEAD FINDINGS Brain: Well-circumscribed rounded hypodensity in the right cerebellum measures 13 x 12 x 12 mm, slightly increased in size from prior exam where it measured 9 mm. No significant surrounding edema or mass effect. No acute hemorrhage. Age related atrophy with moderate chronic small vessel ischemia. Remote lacunar infarcts in bilateral basal ganglia. No subdural or extra-axial  collection. No hydrocephalus, the basilar cisterns are patent. Vascular: Atherosclerosis of skullbase vasculature without hyperdense vessel or abnormal calcification. Skull: No fracture or focal lesion. Sinuses/Orbits: Paranasal sinuses and mastoid air cells are clear. The visualized orbits are unremarkable. Other: None. CT CERVICAL SPINE FINDINGS Alignment: Straightening of normal lordosis. Trace anterolisthesis of C3 on C4 and C4 on C5 again seen. Facets normally aligned. Skull base and vertebrae: No acute fracture or focal pathologic process. The dens and skull base are intact. Soft tissues and spinal canal: No prevertebral fluid or swelling. No visible canal hematoma. Disc levels: Diffuse degenerative disc disease with diffuse disc space narrowing and endplate spurring. Near complete disc space loss at C3-C4, C4-C5, and C5-C6, unchanged from prior exam. Multilevel facet hypertrophy. No high-grade canal stenosis. Scattered multilevel neural foraminal stenosis. Upper chest: Apical emphysema. No acute finding. Other: None. IMPRESSION: 1. Well-circumscribed rounded hypodensity in the right cerebellum measures 13 x 12 x 12 mm, slightly increased in size from June 2020 exam where it measured 9 mm. No significant surrounding edema or mass effect by CT. Given increase in size, recommend further evaluation with MRI to exclude underlying lesion. 2. Age related atrophy and chronic small vessel ischemia. Remote lacunar infarcts in bilateral basal ganglia. 3. Multilevel degenerative change throughout the cervical spine without osseous abnormality of the cervical spine. Electronically Signed   By: Keith Rake M.D.   On: 03/13/2019 01:55   Mr Jeri Cos And Wo Contrast  Result Date: 03/13/2019 CLINICAL DATA:  Right cerebellar lesion.  Flaccid paralysis. EXAM: MRI HEAD WITHOUT AND WITH CONTRAST TECHNIQUE: Multiplanar, multiecho pulse sequences of the brain and surrounding structures were obtained without and with  intravenous contrast. CONTRAST:  87mL GADAVIST GADOBUTROL 1 MMOL/ML IV SOLN COMPARISON:  CT head and cervical spine earlier the same day FINDINGS: Brain: 18 solid and ring-enhancing cerebral and cerebellar masses consistent with metastatic disease. The largest is in the right cerebellum at 15 mm. No associated edema. The larger lesions are ring-enhancing and show blood products. No acute blood products by CT. There is a extra-axial dural-based mass along the inferior right temporal lobe measuring  12 mm, likely an incidental meningioma. There is also a solid and cystic mass spanning C3 to C7 (5 cm in length) with extensive edema in the cervical cord extending into the cervicomedullary junction. There is underlying advanced degenerative spinal stenosis at C2-3 and C3-4. No acute infarct, hydrocephalus, or collection. Confluent chronic small vessel ischemic gliosis in the cerebral white matter. Symmetric remote insult to the globus pallidus. Vascular: Normal flow voids. Skull and upper cervical spine: Cervical spine findings noted above. No focal marrow lesion. Sinuses/Orbits: Negative These results were called by telephone at the time of interpretation on 03/13/2019 at 5:12 am to provider [facility] , who verbally acknowledged these results. IMPRESSION: 1. 18 brain lesions most consistent with metastatic disease. The largest measures 14 mm in the right cerebellum. 2. Large cervical cord mass compatible with metastasis in this setting, with extensive edema. Electronically Signed   By: Monte Fantasia M.D.   On: 03/13/2019 05:13    Review of Systems  Constitutional: Negative.   HENT: Negative.   Eyes: Negative.   Respiratory: Negative.   Cardiovascular: Negative.   Gastrointestinal: Negative.   Genitourinary: Negative.   Musculoskeletal: Positive for neck pain.  Skin: Negative.   Neurological: Positive for sensory change and weakness.  Endo/Heme/Allergies: Negative.   Psychiatric/Behavioral: Negative.     Blood pressure (!) 162/79, pulse 73, temperature 98.7 F (37.1 C), temperature source Oral, resp. rate 17, SpO2 96 %. Physical Exam  Constitutional: She is oriented to person, place, and time. She appears well-developed and well-nourished.  HENT:  Head: Normocephalic and atraumatic.  Eyes: Pupils are equal, round, and reactive to light. Conjunctivae and EOM are normal.  Neck: Normal range of motion. Neck supple.  Respiratory: Effort normal.  GI: Soft.  Musculoskeletal:     Comments: Flaccid paralysis in upper and lower extremities. Some proximal movement of the right upper extremity and movement of the right lower extremity with 3 out of 5 strength. Left upper extremity demonstrates some tone proximally but distally the patient has no evidence of wrist extensor grip or intrinsic function.  Neurological: She is alert and oriented to person, place, and time.  Absent deep tendon reflexes in all 4 extremities.  Skin: Skin is warm and dry.  Psychiatric: She has a normal mood and affect. Her behavior is normal. Judgment and thought content normal.    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 Patient has some mild history of neck pain but she has multiple metastatic lesions in the brain and a large lesion in the spinal cord. Patient is not keen on any surgical intervention and I am not certain that any treatment even palliative is likely to yield any improvement of function. Ultimately I believe her best choice is conservative care comfort measures and hospice. If more aggressive conservative intervention is chosen and I believe she would require a metastatic work-up which I would defer to the medical service.  Blanchie Dessert Jareb Radoncic 03/13/2019, 8:15 AM

## 2019-03-13 NOTE — Discharge Instructions (Signed)
Information with hematology oncology at [facility] provided above.  Neurosurgery's information provided above.  Would recommend calling your primary care doctor up in [place] with some questions about hospice care.

## 2019-04-28 DEATH — deceased

## 2021-08-05 IMAGING — MR MR HEAD WO/W CM
12 of 14 series · 38 of 48 positions shown · IV contrast (gadavist)
Comparison: CT head and cervical spine earlier the same day

CLINICAL DATA: Right cerebellar lesion.  Flaccid paralysis.

EXAM:
MRI HEAD WITHOUT AND WITH CONTRAST
TECHNIQUE: Multiplanar, multiecho pulse sequences of the brain and surrounding
structures were obtained without and with intravenous contrast.
CONTRAST:  5mL GADAVIST GADOBUTROL 1 MMOL/ML IV SOLN

[Series 5: DWI · axial · 3.0mm · 0.88mm/px · z∈[-80,+59]mm · 6 of 96 slices shown (1 of 4)]
[im 1/96]
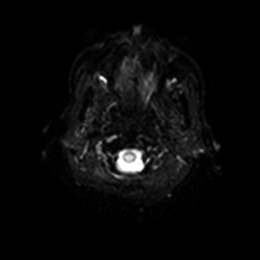
[im 20/96]
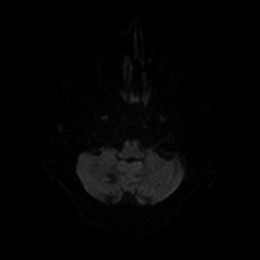
[im 39/96]
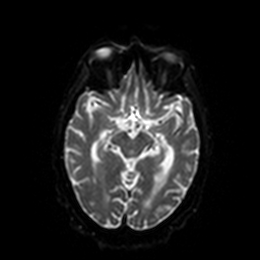
[im 58/96]
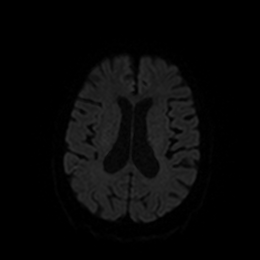
[im 77/96]
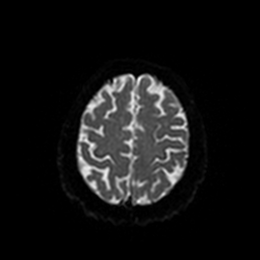
[im 96/96]
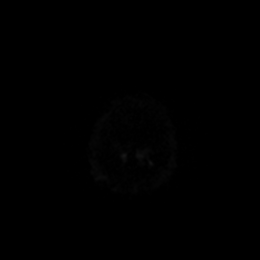

[Series 6: DWI · axial · 3.0mm · 0.88mm/px · z∈[-80,+59]mm · 4 of 48 slices shown (2 of 4)]
[im 1/48]
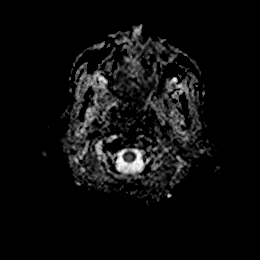
[im 16/48]
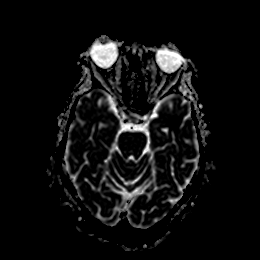
[im 32/48]
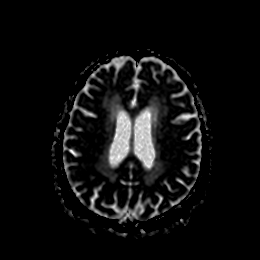
[im 48/48]
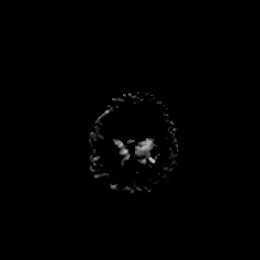

[Series 7: DWI · coronal · 4.0mm · 0.88mm/px · 5 of 64 slices shown (3 of 4)]
[im 1/64]
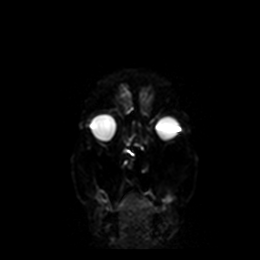
[im 16/64]
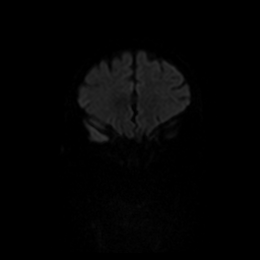
[im 32/64]
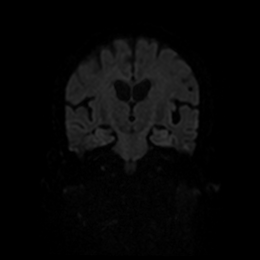
[im 48/64]
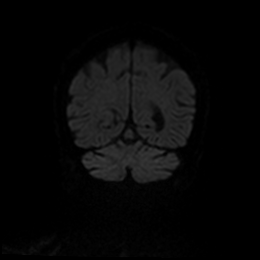
[im 64/64]
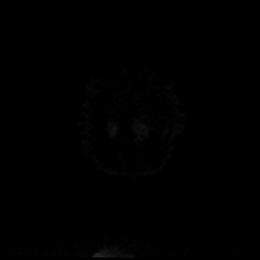

[Series 8: DWI · coronal · 4.0mm · 0.88mm/px · 2 of 32 slices shown (4 of 4)]
[im 1/32]
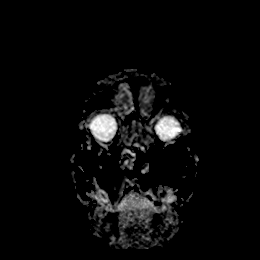
[im 32/32]
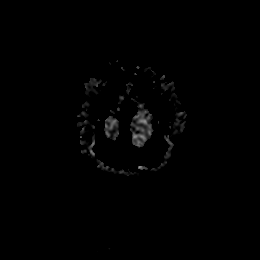

[Series 9: T1 · sagittal · 5.0mm · 0.75mm/px · 2 of 25 slices shown]
[im 1/25]
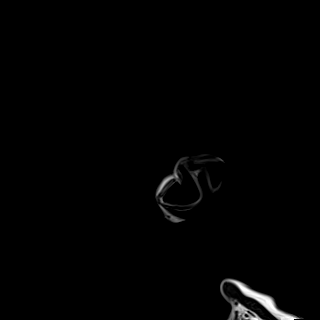
[im 25/25]
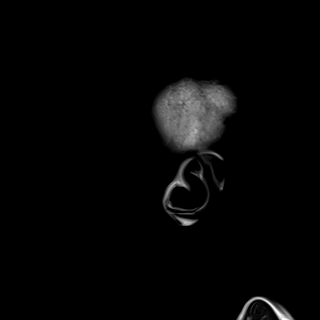

[Series 10: T2 · axial · 5.0mm · 0.72mm/px · z∈[-77,+71]mm · 2 of 26 slices shown]
[im 1/26]
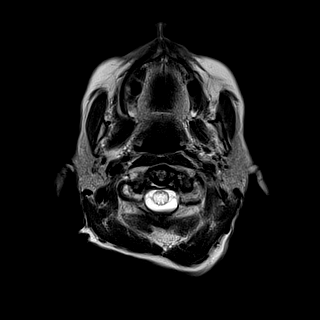
[im 26/26]
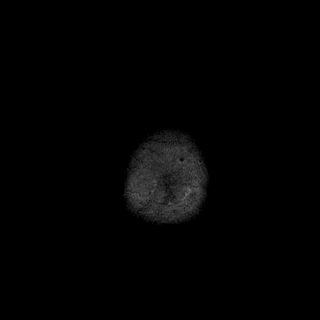

[Series 11: FLAIR · axial · 5.0mm · 0.45mm/px · z∈[-77,+71]mm · 2 of 26 slices shown]
[im 1/26]
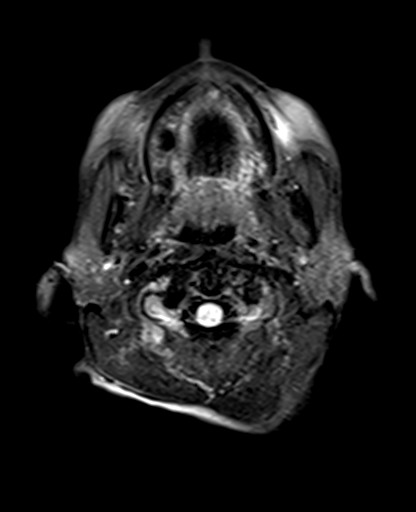
[im 26/26]
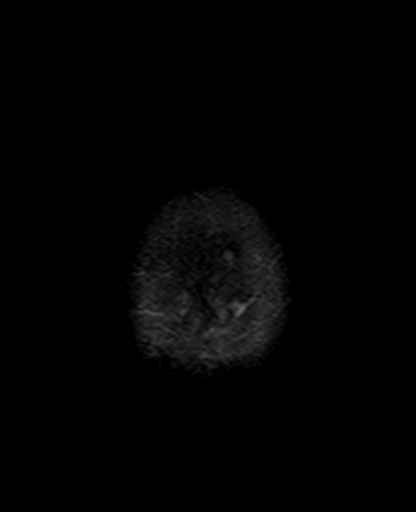

[Series 13: pha_images · axial · 3.0mm · 0.90mm/px · z∈[-82,+94]mm · 4 of 59 slices shown]
[im 1/59]
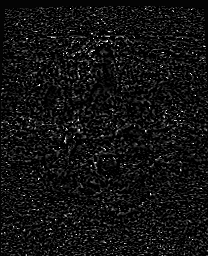
[im 20/59]
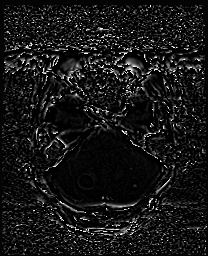
[im 39/59]
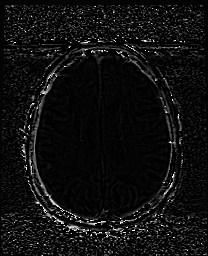
[im 59/59]
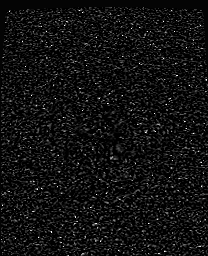

[Series 14: swi_images · axial · 3.0mm · 0.90mm/px · z∈[-82,+94]mm · 5 of 60 slices shown]
[im 1/60]
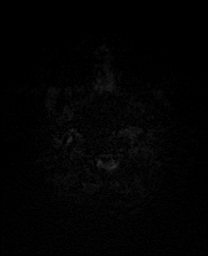
[im 15/60]
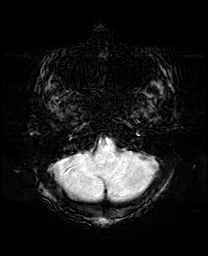
[im 30/60]
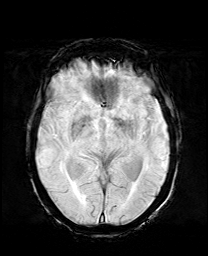
[im 45/60]
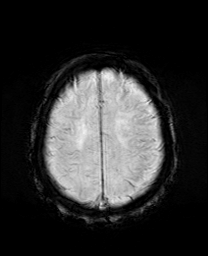
[im 60/60]
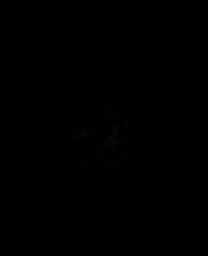

[Series 17: T2 post-contrast · coronal · 5.0mm · 0.72mm/px · 2 of 28 slices shown]
[im 1/28]
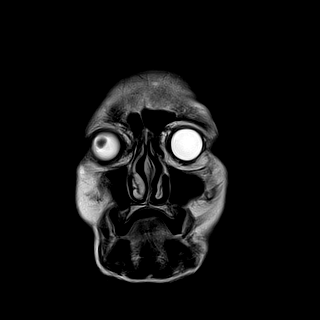
[im 28/28]
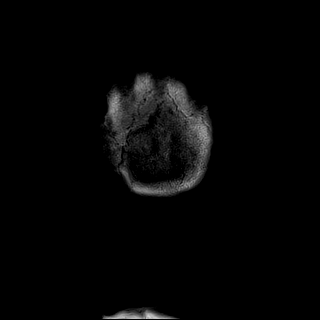

[Series 19: T1 post-contrast · coronal · 5.0mm · 0.34mm/px · 2 of 28 slices shown (1 of 2)]
[im 1/28]
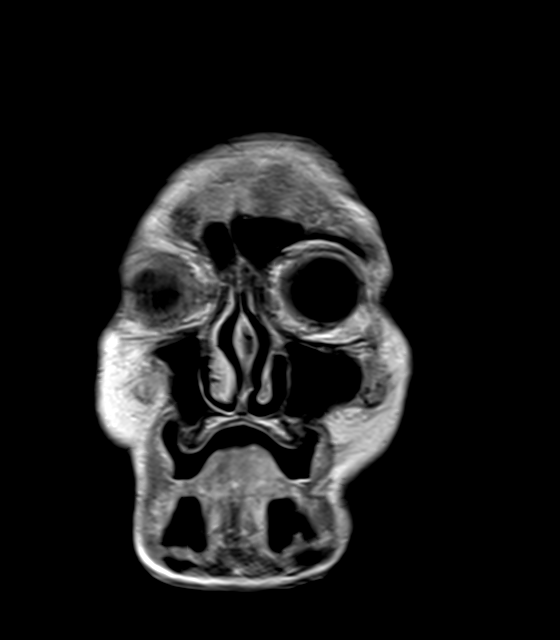
[im 28/28]
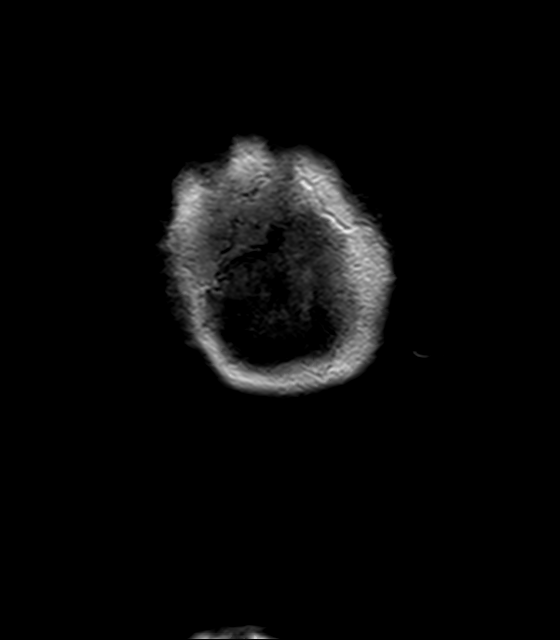

[Series 20: T1 post-contrast · sagittal · 5.0mm · 0.75mm/px · 2 of 25 slices shown (2 of 2)]
[im 1/25]
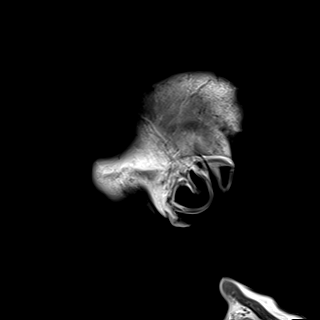
[im 25/25]
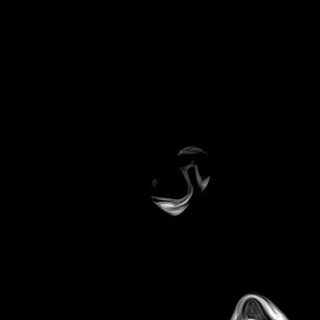

[38 of 48 positions shown; findings below may reference images not displayed]

FINDINGS: Brain: 18 solid and ring-enhancing cerebral and cerebellar masses
consistent with metastatic disease. The largest is in the right
cerebellum at 15 mm. No associated edema. The larger lesions are
ring-enhancing and show blood products. No acute blood products by
CT. There is a extra-axial dural-based mass along the inferior right
temporal lobe measuring 12 mm, likely an incidental meningioma.

There is also a solid and cystic mass spanning C3 to C7 (5 cm in
length) with extensive edema in the cervical cord extending into the
cervicomedullary junction. There is underlying advanced degenerative
spinal stenosis at C2-3 and C3-4.

No acute infarct, hydrocephalus, or collection. Confluent chronic
small vessel ischemic gliosis in the cerebral white matter.
Symmetric remote insult to the globus pallidus.

Vascular: Normal flow voids.

Skull and upper cervical spine: Cervical spine findings noted above.
No focal marrow lesion.

Sinuses/Orbits: Negative

These results were called by telephone at the time of interpretation
on 03/13/2019 at [DATE] to provider TASFIAH FUHAD , who verbally
acknowledged these results.
IMPRESSION: 1. 18 brain lesions most consistent with metastatic disease. The
largest measures 14 mm in the right cerebellum.
2. Large cervical cord mass compatible with metastasis in this
setting, with extensive edema.
